# Patient Record
Sex: Female | Born: 1965 | Race: White | Hispanic: No | Marital: Single | Smoking: Current every day smoker
Health system: Southern US, Community
[De-identification: ages and names within clinical notes are randomized; demographics above are authoritative.]

## PROBLEM LIST (undated history)

## (undated) DIAGNOSIS — F319 Bipolar disorder, unspecified: Secondary | ICD-10-CM

## (undated) DIAGNOSIS — F4310 Post-traumatic stress disorder, unspecified: Secondary | ICD-10-CM

## (undated) DIAGNOSIS — F32A Depression, unspecified: Secondary | ICD-10-CM

## (undated) DIAGNOSIS — F259 Schizoaffective disorder, unspecified: Secondary | ICD-10-CM

## (undated) DIAGNOSIS — E7800 Pure hypercholesterolemia, unspecified: Secondary | ICD-10-CM

## (undated) DIAGNOSIS — F609 Personality disorder, unspecified: Secondary | ICD-10-CM

## (undated) DIAGNOSIS — F419 Anxiety disorder, unspecified: Secondary | ICD-10-CM

## (undated) DIAGNOSIS — E039 Hypothyroidism, unspecified: Secondary | ICD-10-CM

## (undated) DIAGNOSIS — F329 Major depressive disorder, single episode, unspecified: Secondary | ICD-10-CM

## (undated) DIAGNOSIS — F909 Attention-deficit hyperactivity disorder, unspecified type: Secondary | ICD-10-CM

## (undated) HISTORY — PX: CHOLECYSTECTOMY: SHX55

## (undated) HISTORY — PX: MANDIBLE FRACTURE SURGERY: SHX706

---

## 2004-09-24 ENCOUNTER — Emergency Department (HOSPITAL_COMMUNITY): Admission: EM | Admit: 2004-09-24 | Discharge: 2004-09-24 | Payer: Self-pay | Admitting: Emergency Medicine

## 2006-09-08 ENCOUNTER — Emergency Department (HOSPITAL_COMMUNITY): Admission: EM | Admit: 2006-09-08 | Discharge: 2006-09-08 | Payer: Self-pay | Admitting: Emergency Medicine

## 2006-09-19 ENCOUNTER — Emergency Department (HOSPITAL_COMMUNITY): Admission: EM | Admit: 2006-09-19 | Discharge: 2006-09-19 | Payer: Self-pay | Admitting: Emergency Medicine

## 2006-10-17 ENCOUNTER — Emergency Department (HOSPITAL_COMMUNITY): Admission: EM | Admit: 2006-10-17 | Discharge: 2006-10-17 | Payer: Self-pay | Admitting: Emergency Medicine

## 2006-11-01 ENCOUNTER — Emergency Department (HOSPITAL_COMMUNITY): Admission: EM | Admit: 2006-11-01 | Discharge: 2006-11-02 | Payer: Self-pay | Admitting: Emergency Medicine

## 2006-11-06 ENCOUNTER — Emergency Department (HOSPITAL_COMMUNITY): Admission: EM | Admit: 2006-11-06 | Discharge: 2006-11-07 | Payer: Self-pay | Admitting: Emergency Medicine

## 2006-11-13 ENCOUNTER — Emergency Department (HOSPITAL_COMMUNITY): Admission: EM | Admit: 2006-11-13 | Discharge: 2006-11-13 | Payer: Self-pay | Admitting: Emergency Medicine

## 2006-11-15 ENCOUNTER — Emergency Department (HOSPITAL_COMMUNITY): Admission: EM | Admit: 2006-11-15 | Discharge: 2006-11-15 | Payer: Self-pay | Admitting: Emergency Medicine

## 2006-11-16 ENCOUNTER — Emergency Department (HOSPITAL_COMMUNITY): Admission: EM | Admit: 2006-11-16 | Discharge: 2006-11-16 | Payer: Self-pay | Admitting: Emergency Medicine

## 2006-11-19 ENCOUNTER — Emergency Department (HOSPITAL_COMMUNITY): Admission: EM | Admit: 2006-11-19 | Discharge: 2006-11-19 | Payer: Self-pay | Admitting: Emergency Medicine

## 2006-11-22 ENCOUNTER — Other Ambulatory Visit: Payer: Self-pay | Admitting: Emergency Medicine

## 2006-11-22 ENCOUNTER — Other Ambulatory Visit: Payer: Self-pay

## 2006-11-22 ENCOUNTER — Inpatient Hospital Stay (HOSPITAL_COMMUNITY): Admission: RE | Admit: 2006-11-22 | Discharge: 2006-11-25 | Payer: Self-pay | Admitting: Psychiatry

## 2006-11-22 ENCOUNTER — Ambulatory Visit: Payer: Self-pay | Admitting: Psychiatry

## 2006-11-28 ENCOUNTER — Emergency Department (HOSPITAL_COMMUNITY): Admission: EM | Admit: 2006-11-28 | Discharge: 2006-11-28 | Payer: Self-pay | Admitting: Emergency Medicine

## 2006-11-29 ENCOUNTER — Inpatient Hospital Stay (HOSPITAL_COMMUNITY): Admission: AD | Admit: 2006-11-29 | Discharge: 2006-11-30 | Payer: Self-pay | Admitting: Psychiatry

## 2006-12-01 ENCOUNTER — Other Ambulatory Visit (HOSPITAL_COMMUNITY): Admission: RE | Admit: 2006-12-01 | Discharge: 2006-12-06 | Payer: Self-pay | Admitting: Psychiatry

## 2006-12-02 ENCOUNTER — Emergency Department (HOSPITAL_COMMUNITY): Admission: EM | Admit: 2006-12-02 | Discharge: 2006-12-02 | Payer: Self-pay | Admitting: Emergency Medicine

## 2006-12-07 ENCOUNTER — Inpatient Hospital Stay (HOSPITAL_COMMUNITY): Admission: AD | Admit: 2006-12-07 | Discharge: 2006-12-09 | Payer: Self-pay | Admitting: Psychiatry

## 2006-12-12 ENCOUNTER — Other Ambulatory Visit (HOSPITAL_COMMUNITY): Admission: RE | Admit: 2006-12-12 | Discharge: 2007-03-12 | Payer: Self-pay | Admitting: Psychiatry

## 2006-12-13 ENCOUNTER — Emergency Department (HOSPITAL_COMMUNITY): Admission: EM | Admit: 2006-12-13 | Discharge: 2006-12-13 | Payer: Self-pay | Admitting: Emergency Medicine

## 2006-12-20 ENCOUNTER — Emergency Department (HOSPITAL_COMMUNITY): Admission: EM | Admit: 2006-12-20 | Discharge: 2006-12-20 | Payer: Self-pay | Admitting: Emergency Medicine

## 2006-12-25 ENCOUNTER — Emergency Department (HOSPITAL_COMMUNITY): Admission: EM | Admit: 2006-12-25 | Discharge: 2006-12-25 | Payer: Self-pay | Admitting: Emergency Medicine

## 2007-01-01 ENCOUNTER — Emergency Department (HOSPITAL_COMMUNITY): Admission: EM | Admit: 2007-01-01 | Discharge: 2007-01-01 | Payer: Self-pay | Admitting: Emergency Medicine

## 2007-01-07 ENCOUNTER — Emergency Department (HOSPITAL_COMMUNITY): Admission: EM | Admit: 2007-01-07 | Discharge: 2007-01-07 | Payer: Self-pay | Admitting: Emergency Medicine

## 2007-01-13 ENCOUNTER — Emergency Department (HOSPITAL_COMMUNITY): Admission: EM | Admit: 2007-01-13 | Discharge: 2007-01-13 | Payer: Self-pay | Admitting: Emergency Medicine

## 2007-01-21 ENCOUNTER — Emergency Department (HOSPITAL_COMMUNITY): Admission: EM | Admit: 2007-01-21 | Discharge: 2007-01-21 | Payer: Self-pay | Admitting: Emergency Medicine

## 2007-01-29 ENCOUNTER — Emergency Department (HOSPITAL_COMMUNITY): Admission: EM | Admit: 2007-01-29 | Discharge: 2007-01-29 | Payer: Self-pay | Admitting: Emergency Medicine

## 2007-02-04 ENCOUNTER — Emergency Department (HOSPITAL_COMMUNITY): Admission: EM | Admit: 2007-02-04 | Discharge: 2007-02-04 | Payer: Self-pay | Admitting: Emergency Medicine

## 2007-02-18 ENCOUNTER — Emergency Department (HOSPITAL_COMMUNITY): Admission: EM | Admit: 2007-02-18 | Discharge: 2007-02-18 | Payer: Self-pay | Admitting: Emergency Medicine

## 2007-03-06 ENCOUNTER — Emergency Department (HOSPITAL_COMMUNITY): Admission: EM | Admit: 2007-03-06 | Discharge: 2007-03-06 | Payer: Self-pay | Admitting: Emergency Medicine

## 2007-04-06 ENCOUNTER — Emergency Department (HOSPITAL_COMMUNITY): Admission: EM | Admit: 2007-04-06 | Discharge: 2007-04-06 | Payer: Self-pay | Admitting: Emergency Medicine

## 2007-04-22 ENCOUNTER — Emergency Department (HOSPITAL_COMMUNITY): Admission: EM | Admit: 2007-04-22 | Discharge: 2007-04-22 | Payer: Self-pay | Admitting: Emergency Medicine

## 2007-04-30 ENCOUNTER — Emergency Department (HOSPITAL_COMMUNITY): Admission: EM | Admit: 2007-04-30 | Discharge: 2007-04-30 | Payer: Self-pay | Admitting: Emergency Medicine

## 2007-05-26 ENCOUNTER — Emergency Department (HOSPITAL_COMMUNITY): Admission: EM | Admit: 2007-05-26 | Discharge: 2007-05-26 | Payer: Self-pay | Admitting: Emergency Medicine

## 2007-06-11 ENCOUNTER — Emergency Department (HOSPITAL_COMMUNITY): Admission: EM | Admit: 2007-06-11 | Discharge: 2007-06-11 | Payer: Self-pay | Admitting: Emergency Medicine

## 2007-06-15 ENCOUNTER — Emergency Department (HOSPITAL_COMMUNITY): Admission: EM | Admit: 2007-06-15 | Discharge: 2007-06-15 | Payer: Self-pay | Admitting: Emergency Medicine

## 2007-06-21 ENCOUNTER — Other Ambulatory Visit: Payer: Self-pay | Admitting: Emergency Medicine

## 2007-06-21 ENCOUNTER — Ambulatory Visit: Payer: Self-pay | Admitting: *Deleted

## 2007-06-21 ENCOUNTER — Inpatient Hospital Stay (HOSPITAL_COMMUNITY): Admission: RE | Admit: 2007-06-21 | Discharge: 2007-06-26 | Payer: Self-pay | Admitting: *Deleted

## 2007-06-23 ENCOUNTER — Other Ambulatory Visit: Payer: Self-pay | Admitting: Emergency Medicine

## 2007-06-28 ENCOUNTER — Emergency Department (HOSPITAL_COMMUNITY): Admission: EM | Admit: 2007-06-28 | Discharge: 2007-06-28 | Payer: Self-pay | Admitting: Emergency Medicine

## 2007-06-30 ENCOUNTER — Other Ambulatory Visit (HOSPITAL_COMMUNITY): Admission: RE | Admit: 2007-06-30 | Discharge: 2007-07-05 | Payer: Self-pay | Admitting: Psychiatry

## 2007-07-04 ENCOUNTER — Ambulatory Visit: Payer: Self-pay | Admitting: Psychiatry

## 2007-07-05 ENCOUNTER — Inpatient Hospital Stay (HOSPITAL_COMMUNITY): Admission: AD | Admit: 2007-07-05 | Discharge: 2007-07-09 | Payer: Self-pay | Admitting: Psychiatry

## 2007-07-05 ENCOUNTER — Other Ambulatory Visit: Payer: Self-pay | Admitting: Emergency Medicine

## 2007-07-11 ENCOUNTER — Other Ambulatory Visit (HOSPITAL_COMMUNITY): Admission: RE | Admit: 2007-07-11 | Discharge: 2007-07-13 | Payer: Self-pay | Admitting: Psychiatry

## 2007-07-12 ENCOUNTER — Emergency Department (HOSPITAL_COMMUNITY): Admission: EM | Admit: 2007-07-12 | Discharge: 2007-07-12 | Payer: Self-pay | Admitting: Emergency Medicine

## 2007-07-13 ENCOUNTER — Emergency Department (HOSPITAL_COMMUNITY): Admission: EM | Admit: 2007-07-13 | Discharge: 2007-07-13 | Payer: Self-pay | Admitting: Emergency Medicine

## 2007-07-15 ENCOUNTER — Inpatient Hospital Stay (HOSPITAL_COMMUNITY): Admission: AD | Admit: 2007-07-15 | Discharge: 2007-07-16 | Payer: Self-pay | Admitting: Psychiatry

## 2007-07-20 ENCOUNTER — Inpatient Hospital Stay (HOSPITAL_COMMUNITY): Admission: AD | Admit: 2007-07-20 | Discharge: 2007-07-25 | Payer: Self-pay | Admitting: Psychiatry

## 2007-07-26 ENCOUNTER — Other Ambulatory Visit (HOSPITAL_COMMUNITY): Admission: RE | Admit: 2007-07-26 | Discharge: 2007-07-26 | Payer: Self-pay | Admitting: Psychiatry

## 2007-07-27 ENCOUNTER — Emergency Department (HOSPITAL_COMMUNITY): Admission: EM | Admit: 2007-07-27 | Discharge: 2007-07-27 | Payer: Self-pay | Admitting: Emergency Medicine

## 2007-07-27 ENCOUNTER — Ambulatory Visit: Payer: Self-pay | Admitting: Psychiatry

## 2007-07-29 ENCOUNTER — Inpatient Hospital Stay (HOSPITAL_COMMUNITY): Admission: EM | Admit: 2007-07-29 | Discharge: 2007-07-31 | Payer: Self-pay | Admitting: Psychiatry

## 2007-08-04 ENCOUNTER — Emergency Department (HOSPITAL_COMMUNITY): Admission: EM | Admit: 2007-08-04 | Discharge: 2007-08-05 | Payer: Self-pay | Admitting: Emergency Medicine

## 2007-08-06 ENCOUNTER — Emergency Department (HOSPITAL_COMMUNITY): Admission: EM | Admit: 2007-08-06 | Discharge: 2007-08-06 | Payer: Self-pay | Admitting: Emergency Medicine

## 2007-08-08 ENCOUNTER — Emergency Department (HOSPITAL_COMMUNITY): Admission: EM | Admit: 2007-08-08 | Discharge: 2007-08-09 | Payer: Self-pay | Admitting: Emergency Medicine

## 2007-08-11 ENCOUNTER — Emergency Department (HOSPITAL_COMMUNITY): Admission: EM | Admit: 2007-08-11 | Discharge: 2007-08-12 | Payer: Self-pay | Admitting: Emergency Medicine

## 2007-08-16 ENCOUNTER — Emergency Department (HOSPITAL_COMMUNITY): Admission: EM | Admit: 2007-08-16 | Discharge: 2007-08-17 | Payer: Self-pay | Admitting: Emergency Medicine

## 2007-08-18 ENCOUNTER — Emergency Department (HOSPITAL_COMMUNITY): Admission: EM | Admit: 2007-08-18 | Discharge: 2007-08-18 | Payer: Self-pay | Admitting: Emergency Medicine

## 2007-08-22 ENCOUNTER — Emergency Department (HOSPITAL_COMMUNITY): Admission: EM | Admit: 2007-08-22 | Discharge: 2007-08-23 | Payer: Self-pay | Admitting: Emergency Medicine

## 2007-08-26 ENCOUNTER — Emergency Department (HOSPITAL_COMMUNITY): Admission: EM | Admit: 2007-08-26 | Discharge: 2007-08-26 | Payer: Self-pay | Admitting: Emergency Medicine

## 2007-09-14 ENCOUNTER — Emergency Department (HOSPITAL_COMMUNITY): Admission: EM | Admit: 2007-09-14 | Discharge: 2007-09-14 | Payer: Self-pay | Admitting: Emergency Medicine

## 2007-09-25 ENCOUNTER — Emergency Department (HOSPITAL_COMMUNITY): Admission: EM | Admit: 2007-09-25 | Discharge: 2007-09-26 | Payer: Self-pay | Admitting: *Deleted

## 2007-10-07 ENCOUNTER — Emergency Department (HOSPITAL_COMMUNITY): Admission: EM | Admit: 2007-10-07 | Discharge: 2007-10-07 | Payer: Self-pay | Admitting: Emergency Medicine

## 2007-10-17 ENCOUNTER — Emergency Department (HOSPITAL_COMMUNITY): Admission: EM | Admit: 2007-10-17 | Discharge: 2007-10-17 | Payer: Self-pay | Admitting: Emergency Medicine

## 2007-10-21 ENCOUNTER — Emergency Department (HOSPITAL_COMMUNITY): Admission: EM | Admit: 2007-10-21 | Discharge: 2007-10-21 | Payer: Self-pay | Admitting: Emergency Medicine

## 2007-10-22 ENCOUNTER — Emergency Department (HOSPITAL_COMMUNITY): Admission: EM | Admit: 2007-10-22 | Discharge: 2007-10-22 | Payer: Self-pay | Admitting: Emergency Medicine

## 2007-10-25 ENCOUNTER — Other Ambulatory Visit: Payer: Self-pay

## 2007-10-25 ENCOUNTER — Ambulatory Visit: Payer: Self-pay | Admitting: Psychiatry

## 2007-10-26 ENCOUNTER — Inpatient Hospital Stay (HOSPITAL_COMMUNITY): Admission: RE | Admit: 2007-10-26 | Discharge: 2007-10-30 | Payer: Self-pay | Admitting: Psychiatry

## 2007-11-09 ENCOUNTER — Emergency Department (HOSPITAL_COMMUNITY): Admission: EM | Admit: 2007-11-09 | Discharge: 2007-11-09 | Payer: Self-pay | Admitting: Emergency Medicine

## 2007-12-11 ENCOUNTER — Emergency Department (HOSPITAL_COMMUNITY): Admission: EM | Admit: 2007-12-11 | Discharge: 2007-12-11 | Payer: Self-pay | Admitting: Emergency Medicine

## 2007-12-17 ENCOUNTER — Emergency Department (HOSPITAL_COMMUNITY): Admission: EM | Admit: 2007-12-17 | Discharge: 2007-12-17 | Payer: Self-pay | Admitting: Emergency Medicine

## 2008-01-03 ENCOUNTER — Emergency Department (HOSPITAL_COMMUNITY): Admission: EM | Admit: 2008-01-03 | Discharge: 2008-01-03 | Payer: Self-pay | Admitting: Emergency Medicine

## 2008-01-14 ENCOUNTER — Emergency Department (HOSPITAL_COMMUNITY): Admission: EM | Admit: 2008-01-14 | Discharge: 2008-01-14 | Payer: Self-pay | Admitting: Emergency Medicine

## 2008-01-15 ENCOUNTER — Emergency Department (HOSPITAL_COMMUNITY): Admission: EM | Admit: 2008-01-15 | Discharge: 2008-01-15 | Payer: Self-pay | Admitting: Emergency Medicine

## 2008-01-16 ENCOUNTER — Emergency Department (HOSPITAL_COMMUNITY): Admission: EM | Admit: 2008-01-16 | Discharge: 2008-01-16 | Payer: Self-pay | Admitting: Emergency Medicine

## 2008-01-18 ENCOUNTER — Ambulatory Visit: Payer: Self-pay | Admitting: Psychiatry

## 2008-01-18 ENCOUNTER — Other Ambulatory Visit: Payer: Self-pay | Admitting: Emergency Medicine

## 2008-01-18 ENCOUNTER — Inpatient Hospital Stay (HOSPITAL_COMMUNITY): Admission: RE | Admit: 2008-01-18 | Discharge: 2008-01-30 | Payer: Self-pay | Admitting: Psychiatry

## 2008-03-31 ENCOUNTER — Emergency Department (HOSPITAL_COMMUNITY): Admission: EM | Admit: 2008-03-31 | Discharge: 2008-04-01 | Payer: Self-pay | Admitting: Emergency Medicine

## 2008-04-01 ENCOUNTER — Emergency Department (HOSPITAL_COMMUNITY): Admission: EM | Admit: 2008-04-01 | Discharge: 2008-04-01 | Payer: Self-pay | Admitting: Emergency Medicine

## 2008-04-10 ENCOUNTER — Emergency Department (HOSPITAL_COMMUNITY): Admission: EM | Admit: 2008-04-10 | Discharge: 2008-04-11 | Payer: Self-pay | Admitting: Emergency Medicine

## 2008-04-13 ENCOUNTER — Emergency Department (HOSPITAL_COMMUNITY): Admission: EM | Admit: 2008-04-13 | Discharge: 2008-04-13 | Payer: Self-pay | Admitting: Emergency Medicine

## 2008-04-20 ENCOUNTER — Emergency Department (HOSPITAL_COMMUNITY): Admission: EM | Admit: 2008-04-20 | Discharge: 2008-04-20 | Payer: Self-pay | Admitting: Emergency Medicine

## 2008-04-21 ENCOUNTER — Emergency Department (HOSPITAL_COMMUNITY): Admission: EM | Admit: 2008-04-21 | Discharge: 2008-04-21 | Payer: Self-pay | Admitting: Emergency Medicine

## 2008-04-21 ENCOUNTER — Emergency Department (HOSPITAL_COMMUNITY): Admission: EM | Admit: 2008-04-21 | Discharge: 2008-04-22 | Payer: Self-pay | Admitting: Emergency Medicine

## 2008-05-01 ENCOUNTER — Emergency Department (HOSPITAL_COMMUNITY): Admission: EM | Admit: 2008-05-01 | Discharge: 2008-05-01 | Payer: Self-pay | Admitting: Emergency Medicine

## 2008-05-09 ENCOUNTER — Emergency Department (HOSPITAL_COMMUNITY): Admission: EM | Admit: 2008-05-09 | Discharge: 2008-05-09 | Payer: Self-pay | Admitting: Emergency Medicine

## 2008-05-10 ENCOUNTER — Emergency Department (HOSPITAL_COMMUNITY): Admission: EM | Admit: 2008-05-10 | Discharge: 2008-05-10 | Payer: Self-pay | Admitting: Emergency Medicine

## 2008-05-15 ENCOUNTER — Emergency Department (HOSPITAL_COMMUNITY): Admission: EM | Admit: 2008-05-15 | Discharge: 2008-05-15 | Payer: Self-pay | Admitting: Emergency Medicine

## 2008-05-17 ENCOUNTER — Emergency Department (HOSPITAL_COMMUNITY): Admission: EM | Admit: 2008-05-17 | Discharge: 2008-05-17 | Payer: Self-pay | Admitting: Emergency Medicine

## 2008-05-19 ENCOUNTER — Emergency Department (HOSPITAL_COMMUNITY): Admission: EM | Admit: 2008-05-19 | Discharge: 2008-05-19 | Payer: Self-pay | Admitting: Emergency Medicine

## 2008-05-20 ENCOUNTER — Emergency Department (HOSPITAL_COMMUNITY): Admission: EM | Admit: 2008-05-20 | Discharge: 2008-05-20 | Payer: Self-pay | Admitting: Emergency Medicine

## 2008-05-24 ENCOUNTER — Emergency Department (HOSPITAL_COMMUNITY): Admission: EM | Admit: 2008-05-24 | Discharge: 2008-05-24 | Payer: Self-pay | Admitting: Emergency Medicine

## 2008-06-01 ENCOUNTER — Emergency Department (HOSPITAL_COMMUNITY): Admission: EM | Admit: 2008-06-01 | Discharge: 2008-06-01 | Payer: Self-pay | Admitting: Emergency Medicine

## 2008-06-07 ENCOUNTER — Emergency Department (HOSPITAL_COMMUNITY): Admission: EM | Admit: 2008-06-07 | Discharge: 2008-06-07 | Payer: Self-pay | Admitting: Emergency Medicine

## 2008-06-14 ENCOUNTER — Emergency Department (HOSPITAL_COMMUNITY): Admission: EM | Admit: 2008-06-14 | Discharge: 2008-06-14 | Payer: Self-pay | Admitting: Emergency Medicine

## 2008-06-16 ENCOUNTER — Emergency Department (HOSPITAL_COMMUNITY): Admission: EM | Admit: 2008-06-16 | Discharge: 2008-06-17 | Payer: Self-pay | Admitting: Emergency Medicine

## 2008-06-27 ENCOUNTER — Emergency Department (HOSPITAL_COMMUNITY): Admission: EM | Admit: 2008-06-27 | Discharge: 2008-06-27 | Payer: Self-pay | Admitting: Emergency Medicine

## 2008-07-04 ENCOUNTER — Emergency Department (HOSPITAL_COMMUNITY): Admission: EM | Admit: 2008-07-04 | Discharge: 2008-07-04 | Payer: Self-pay | Admitting: Emergency Medicine

## 2008-07-06 ENCOUNTER — Emergency Department (HOSPITAL_COMMUNITY): Admission: EM | Admit: 2008-07-06 | Discharge: 2008-07-06 | Payer: Self-pay | Admitting: Emergency Medicine

## 2008-07-08 ENCOUNTER — Emergency Department (HOSPITAL_COMMUNITY): Admission: EM | Admit: 2008-07-08 | Discharge: 2008-07-08 | Payer: Self-pay | Admitting: Emergency Medicine

## 2008-07-12 ENCOUNTER — Emergency Department (HOSPITAL_COMMUNITY): Admission: EM | Admit: 2008-07-12 | Discharge: 2008-07-12 | Payer: Self-pay | Admitting: Emergency Medicine

## 2008-07-14 ENCOUNTER — Emergency Department (HOSPITAL_COMMUNITY): Admission: EM | Admit: 2008-07-14 | Discharge: 2008-07-14 | Payer: Self-pay | Admitting: Emergency Medicine

## 2008-07-16 ENCOUNTER — Emergency Department (HOSPITAL_COMMUNITY): Admission: EM | Admit: 2008-07-16 | Discharge: 2008-07-16 | Payer: Self-pay | Admitting: Emergency Medicine

## 2008-07-21 ENCOUNTER — Emergency Department (HOSPITAL_COMMUNITY): Admission: EM | Admit: 2008-07-21 | Discharge: 2008-07-22 | Payer: Self-pay | Admitting: Emergency Medicine

## 2008-07-22 ENCOUNTER — Emergency Department (HOSPITAL_COMMUNITY): Admission: EM | Admit: 2008-07-22 | Discharge: 2008-07-22 | Payer: Self-pay | Admitting: Emergency Medicine

## 2008-07-25 ENCOUNTER — Emergency Department (HOSPITAL_COMMUNITY): Admission: EM | Admit: 2008-07-25 | Discharge: 2008-07-25 | Payer: Self-pay | Admitting: Emergency Medicine

## 2008-08-01 ENCOUNTER — Emergency Department (HOSPITAL_COMMUNITY): Admission: EM | Admit: 2008-08-01 | Discharge: 2008-08-01 | Payer: Self-pay | Admitting: Emergency Medicine

## 2008-08-03 ENCOUNTER — Emergency Department (HOSPITAL_COMMUNITY): Admission: EM | Admit: 2008-08-03 | Discharge: 2008-08-03 | Payer: Self-pay | Admitting: Emergency Medicine

## 2008-08-07 ENCOUNTER — Other Ambulatory Visit: Payer: Self-pay | Admitting: Emergency Medicine

## 2008-08-08 ENCOUNTER — Other Ambulatory Visit: Payer: Self-pay

## 2008-08-08 ENCOUNTER — Ambulatory Visit: Payer: Self-pay | Admitting: *Deleted

## 2008-08-08 ENCOUNTER — Other Ambulatory Visit: Payer: Self-pay | Admitting: Emergency Medicine

## 2008-08-08 ENCOUNTER — Inpatient Hospital Stay (HOSPITAL_COMMUNITY): Admission: RE | Admit: 2008-08-08 | Discharge: 2008-08-19 | Payer: Self-pay | Admitting: *Deleted

## 2008-08-23 ENCOUNTER — Emergency Department (HOSPITAL_COMMUNITY): Admission: EM | Admit: 2008-08-23 | Discharge: 2008-08-23 | Payer: Self-pay | Admitting: Emergency Medicine

## 2008-08-24 ENCOUNTER — Emergency Department (HOSPITAL_COMMUNITY): Admission: EM | Admit: 2008-08-24 | Discharge: 2008-08-27 | Payer: Self-pay | Admitting: Emergency Medicine

## 2008-08-27 ENCOUNTER — Emergency Department (HOSPITAL_COMMUNITY): Admission: EM | Admit: 2008-08-27 | Discharge: 2008-08-27 | Payer: Self-pay | Admitting: Emergency Medicine

## 2008-08-29 ENCOUNTER — Emergency Department (HOSPITAL_COMMUNITY): Admission: EM | Admit: 2008-08-29 | Discharge: 2008-08-30 | Payer: Self-pay | Admitting: Emergency Medicine

## 2008-09-15 ENCOUNTER — Emergency Department (HOSPITAL_COMMUNITY): Admission: EM | Admit: 2008-09-15 | Discharge: 2008-09-15 | Payer: Self-pay | Admitting: Emergency Medicine

## 2008-09-17 ENCOUNTER — Emergency Department (HOSPITAL_COMMUNITY): Admission: EM | Admit: 2008-09-17 | Discharge: 2008-09-17 | Payer: Self-pay | Admitting: Emergency Medicine

## 2008-09-21 ENCOUNTER — Emergency Department (HOSPITAL_COMMUNITY): Admission: EM | Admit: 2008-09-21 | Discharge: 2008-09-21 | Payer: Self-pay | Admitting: Emergency Medicine

## 2008-09-22 ENCOUNTER — Emergency Department (HOSPITAL_COMMUNITY): Admission: EM | Admit: 2008-09-22 | Discharge: 2008-09-22 | Payer: Self-pay | Admitting: Emergency Medicine

## 2008-09-24 ENCOUNTER — Emergency Department (HOSPITAL_COMMUNITY): Admission: EM | Admit: 2008-09-24 | Discharge: 2008-09-25 | Payer: Self-pay | Admitting: Emergency Medicine

## 2008-09-25 ENCOUNTER — Emergency Department (HOSPITAL_COMMUNITY): Admission: EM | Admit: 2008-09-25 | Discharge: 2008-09-26 | Payer: Self-pay | Admitting: Emergency Medicine

## 2008-10-04 ENCOUNTER — Emergency Department (HOSPITAL_COMMUNITY): Admission: EM | Admit: 2008-10-04 | Discharge: 2008-10-05 | Payer: Self-pay | Admitting: Emergency Medicine

## 2008-10-06 ENCOUNTER — Emergency Department (HOSPITAL_COMMUNITY): Admission: EM | Admit: 2008-10-06 | Discharge: 2008-10-06 | Payer: Self-pay | Admitting: Emergency Medicine

## 2008-10-08 ENCOUNTER — Emergency Department (HOSPITAL_COMMUNITY): Admission: EM | Admit: 2008-10-08 | Discharge: 2008-10-09 | Payer: Self-pay | Admitting: Emergency Medicine

## 2008-10-23 ENCOUNTER — Emergency Department (HOSPITAL_COMMUNITY): Admission: EM | Admit: 2008-10-23 | Discharge: 2008-10-23 | Payer: Self-pay | Admitting: Emergency Medicine

## 2008-10-26 ENCOUNTER — Emergency Department (HOSPITAL_COMMUNITY): Admission: EM | Admit: 2008-10-26 | Discharge: 2008-10-26 | Payer: Self-pay | Admitting: Emergency Medicine

## 2008-10-27 ENCOUNTER — Emergency Department (HOSPITAL_COMMUNITY): Admission: EM | Admit: 2008-10-27 | Discharge: 2008-10-27 | Payer: Self-pay | Admitting: Emergency Medicine

## 2008-10-31 ENCOUNTER — Emergency Department (HOSPITAL_COMMUNITY): Admission: EM | Admit: 2008-10-31 | Discharge: 2008-10-31 | Payer: Self-pay | Admitting: Emergency Medicine

## 2008-11-03 ENCOUNTER — Emergency Department (HOSPITAL_COMMUNITY): Admission: EM | Admit: 2008-11-03 | Discharge: 2008-11-03 | Payer: Self-pay | Admitting: Emergency Medicine

## 2008-11-13 ENCOUNTER — Emergency Department (HOSPITAL_COMMUNITY): Admission: EM | Admit: 2008-11-13 | Discharge: 2008-11-13 | Payer: Self-pay | Admitting: Emergency Medicine

## 2008-11-18 ENCOUNTER — Emergency Department (HOSPITAL_COMMUNITY): Admission: EM | Admit: 2008-11-18 | Discharge: 2008-11-18 | Payer: Self-pay | Admitting: Emergency Medicine

## 2008-11-21 ENCOUNTER — Emergency Department (HOSPITAL_COMMUNITY): Admission: EM | Admit: 2008-11-21 | Discharge: 2008-11-21 | Payer: Self-pay | Admitting: Emergency Medicine

## 2008-11-28 ENCOUNTER — Emergency Department (HOSPITAL_COMMUNITY): Admission: EM | Admit: 2008-11-28 | Discharge: 2008-11-28 | Payer: Self-pay | Admitting: Emergency Medicine

## 2008-11-30 ENCOUNTER — Emergency Department (HOSPITAL_COMMUNITY): Admission: EM | Admit: 2008-11-30 | Discharge: 2008-11-30 | Payer: Self-pay | Admitting: Emergency Medicine

## 2008-12-07 ENCOUNTER — Emergency Department (HOSPITAL_COMMUNITY): Admission: EM | Admit: 2008-12-07 | Discharge: 2008-12-07 | Payer: Self-pay | Admitting: Emergency Medicine

## 2008-12-10 ENCOUNTER — Emergency Department (HOSPITAL_COMMUNITY): Admission: EM | Admit: 2008-12-10 | Discharge: 2008-12-11 | Payer: Self-pay | Admitting: Emergency Medicine

## 2008-12-12 ENCOUNTER — Emergency Department (HOSPITAL_COMMUNITY): Admission: EM | Admit: 2008-12-12 | Discharge: 2008-12-12 | Payer: Self-pay | Admitting: Emergency Medicine

## 2009-01-27 ENCOUNTER — Emergency Department (HOSPITAL_COMMUNITY): Admission: EM | Admit: 2009-01-27 | Discharge: 2009-01-27 | Payer: Self-pay | Admitting: Emergency Medicine

## 2009-02-01 ENCOUNTER — Emergency Department (HOSPITAL_COMMUNITY): Admission: EM | Admit: 2009-02-01 | Discharge: 2009-02-01 | Payer: Self-pay | Admitting: Emergency Medicine

## 2009-03-16 ENCOUNTER — Emergency Department (HOSPITAL_COMMUNITY): Admission: EM | Admit: 2009-03-16 | Discharge: 2009-03-16 | Payer: Self-pay | Admitting: Emergency Medicine

## 2009-06-01 ENCOUNTER — Emergency Department (HOSPITAL_COMMUNITY): Admission: EM | Admit: 2009-06-01 | Discharge: 2009-06-01 | Payer: Self-pay | Admitting: Emergency Medicine

## 2009-06-28 ENCOUNTER — Emergency Department (HOSPITAL_COMMUNITY): Admission: EM | Admit: 2009-06-28 | Discharge: 2009-06-28 | Payer: Self-pay | Admitting: Emergency Medicine

## 2009-06-29 ENCOUNTER — Emergency Department (HOSPITAL_COMMUNITY): Admission: EM | Admit: 2009-06-29 | Discharge: 2009-06-29 | Payer: Self-pay | Admitting: Emergency Medicine

## 2009-07-22 IMAGING — CR DG CHEST 2V
2 series · 2 of 2 positions shown · non-contrast
Comparison: 12/13/2006

CLINICAL DATA: Dizziness short of breath chest pain

CHEST - 2 VIEW

[w chest pa]
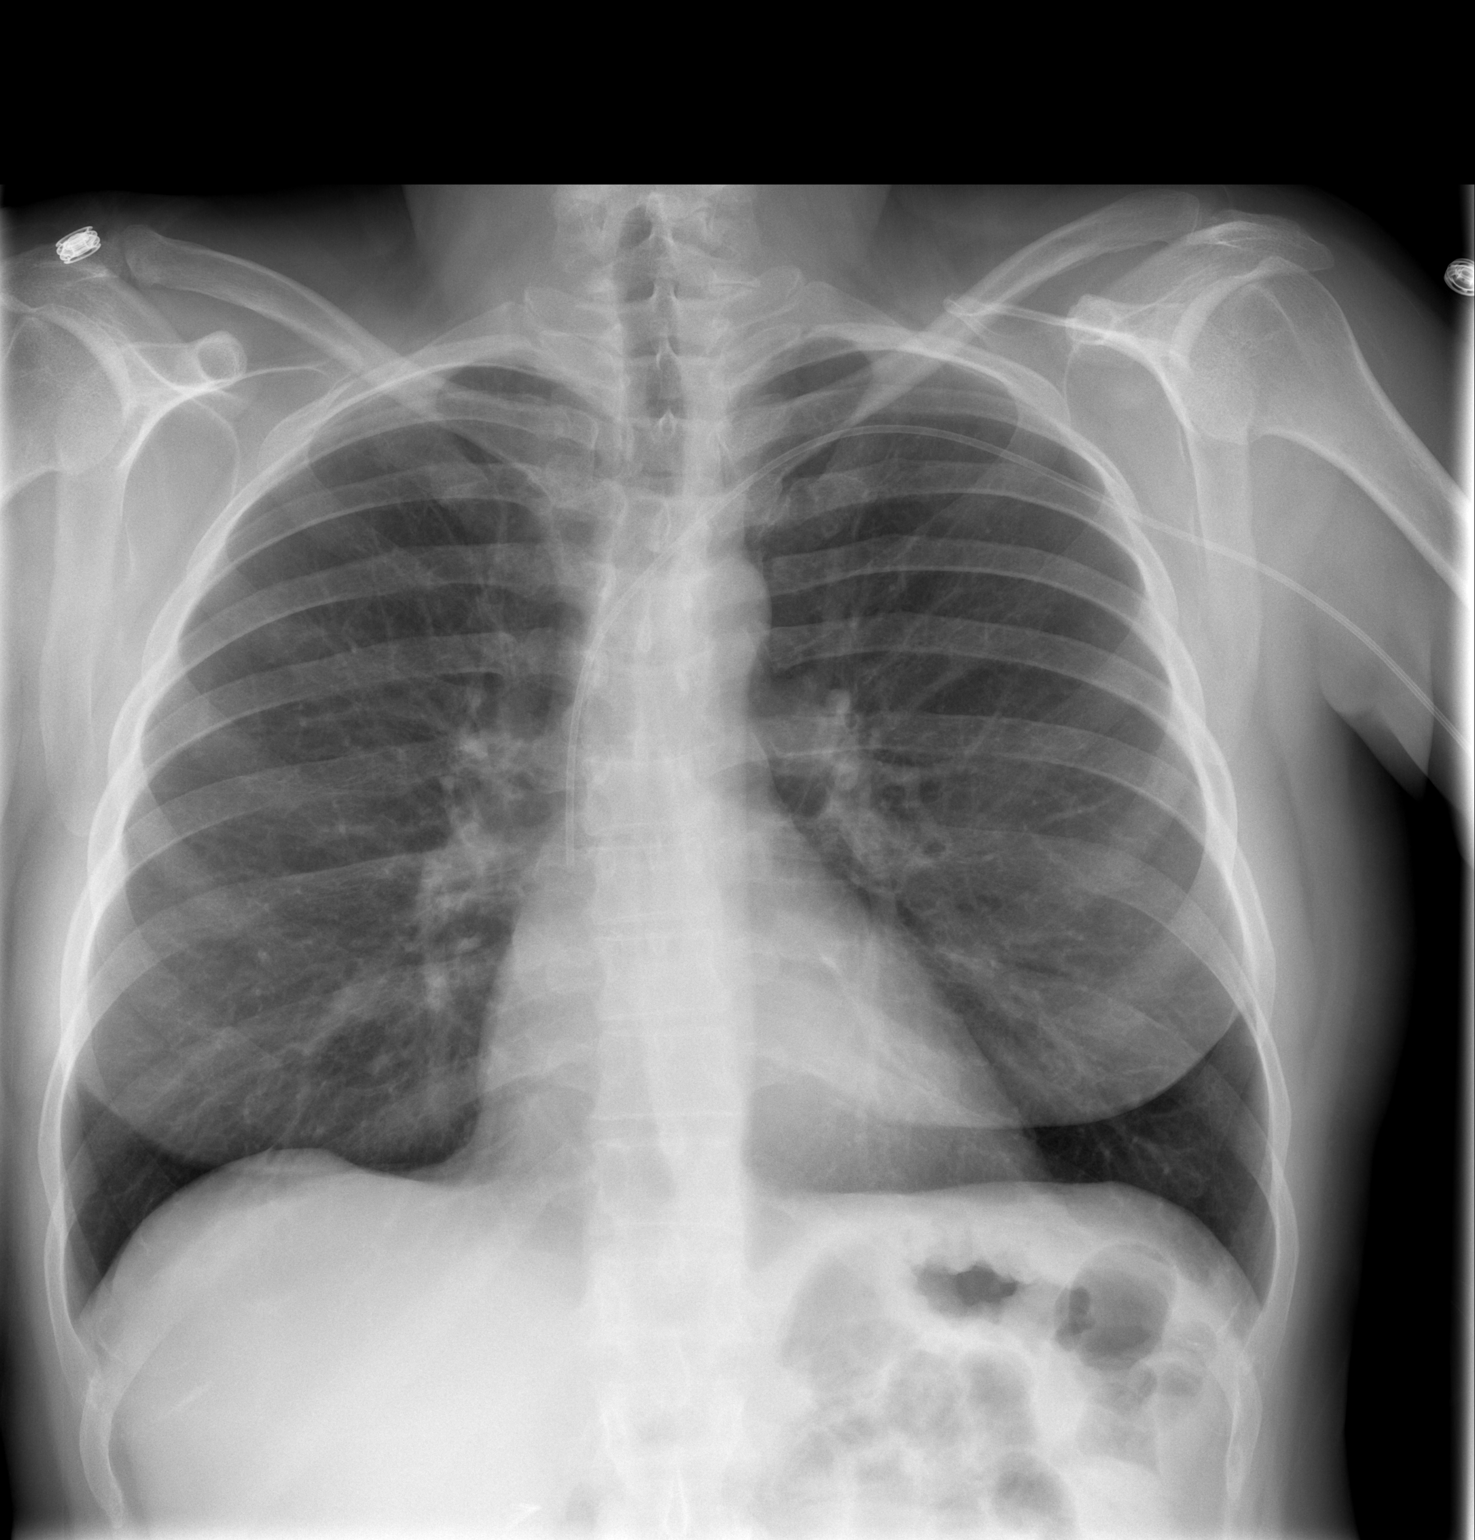

[w chest lat]
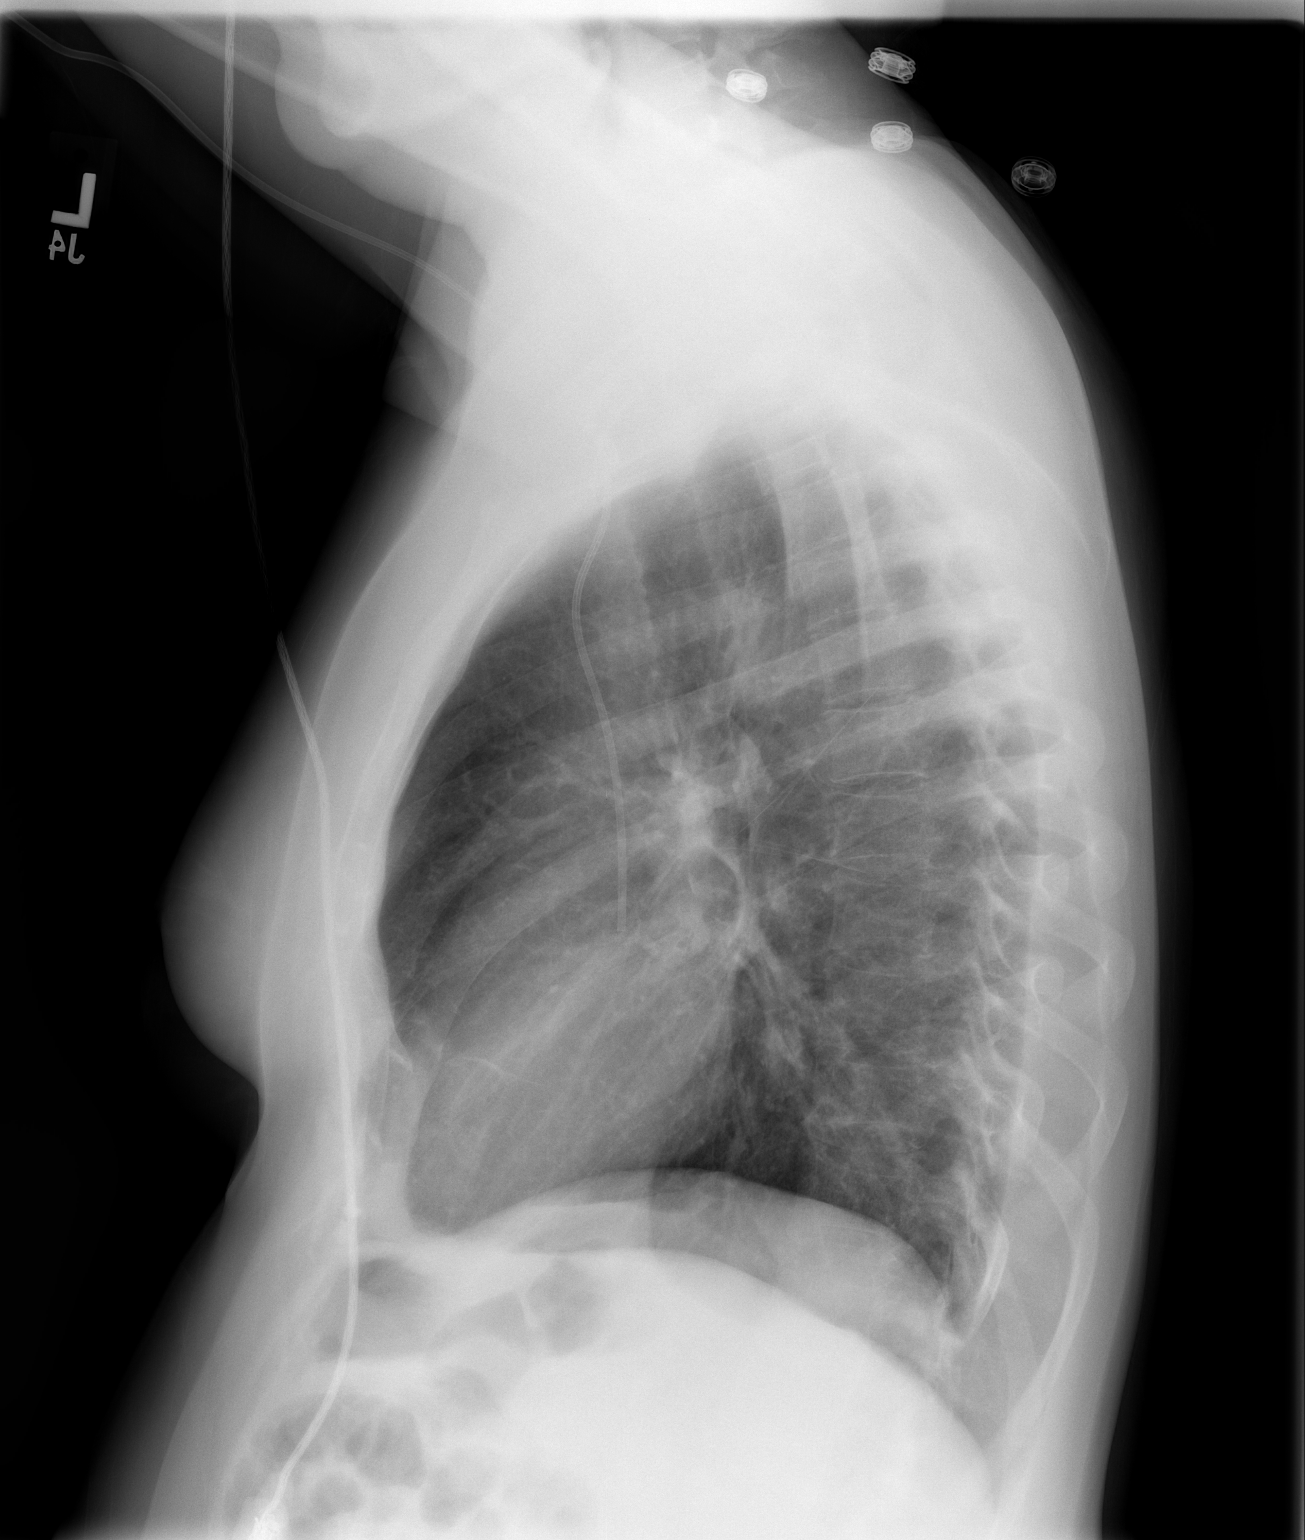

[2 of 2 positions shown; findings below may reference images not displayed]

FINDINGS: Left arm PICC line tip is in the SVC.  There are
increased interstitial markings relative to the prior study.  This
could be due to fluid overload or an acute infectious process such
as a viral pneumonia.  There is no pleural effusion.  There is no
lumbar infiltrate.
IMPRESSION: Increased interstitial markings may be due to fluid overload or an
infectious process such as viral pneumonia.

## 2009-09-26 ENCOUNTER — Emergency Department (HOSPITAL_COMMUNITY): Admission: EM | Admit: 2009-09-26 | Discharge: 2009-09-26 | Payer: Self-pay | Admitting: Emergency Medicine

## 2009-09-27 ENCOUNTER — Emergency Department (HOSPITAL_COMMUNITY): Admission: EM | Admit: 2009-09-27 | Discharge: 2009-09-27 | Payer: Self-pay | Admitting: Emergency Medicine

## 2009-10-04 ENCOUNTER — Emergency Department (HOSPITAL_COMMUNITY): Admission: EM | Admit: 2009-10-04 | Discharge: 2009-10-04 | Payer: Self-pay | Admitting: Emergency Medicine

## 2009-11-08 ENCOUNTER — Emergency Department (HOSPITAL_COMMUNITY): Admission: EM | Admit: 2009-11-08 | Discharge: 2009-11-08 | Payer: Self-pay | Admitting: Emergency Medicine

## 2010-01-07 ENCOUNTER — Emergency Department (HOSPITAL_COMMUNITY)
Admission: EM | Admit: 2010-01-07 | Discharge: 2010-01-07 | Payer: Self-pay | Source: Home / Self Care | Admitting: Emergency Medicine

## 2010-02-21 ENCOUNTER — Emergency Department (HOSPITAL_COMMUNITY)
Admission: EM | Admit: 2010-02-21 | Discharge: 2010-02-21 | Disposition: A | Discharge status: Home / Self Care | Payer: Medicare Other | Attending: Emergency Medicine | Admitting: Emergency Medicine

## 2010-02-21 DIAGNOSIS — G43909 Migraine, unspecified, not intractable, without status migrainosus: Secondary | ICD-10-CM | POA: Insufficient documentation

## 2010-02-21 DIAGNOSIS — F319 Bipolar disorder, unspecified: Secondary | ICD-10-CM | POA: Insufficient documentation

## 2010-02-21 DIAGNOSIS — R110 Nausea: Secondary | ICD-10-CM | POA: Insufficient documentation

## 2010-02-21 DIAGNOSIS — F411 Generalized anxiety disorder: Secondary | ICD-10-CM | POA: Insufficient documentation

## 2010-02-21 DIAGNOSIS — E039 Hypothyroidism, unspecified: Secondary | ICD-10-CM | POA: Insufficient documentation

## 2010-02-21 DIAGNOSIS — E785 Hyperlipidemia, unspecified: Secondary | ICD-10-CM | POA: Insufficient documentation

## 2010-03-01 ENCOUNTER — Emergency Department (HOSPITAL_COMMUNITY)
Admission: EM | Admit: 2010-03-01 | Discharge: 2010-03-01 | Disposition: A | Discharge status: Home / Self Care | Payer: Medicare Other | Attending: Emergency Medicine | Admitting: Emergency Medicine

## 2010-03-01 DIAGNOSIS — R45850 Homicidal ideations: Secondary | ICD-10-CM | POA: Insufficient documentation

## 2010-03-01 DIAGNOSIS — R45851 Suicidal ideations: Secondary | ICD-10-CM | POA: Insufficient documentation

## 2010-03-01 DIAGNOSIS — E039 Hypothyroidism, unspecified: Secondary | ICD-10-CM | POA: Insufficient documentation

## 2010-03-01 DIAGNOSIS — Z008 Encounter for other general examination: Secondary | ICD-10-CM | POA: Insufficient documentation

## 2010-03-01 DIAGNOSIS — IMO0002 Reserved for concepts with insufficient information to code with codable children: Secondary | ICD-10-CM | POA: Insufficient documentation

## 2010-03-01 DIAGNOSIS — G43909 Migraine, unspecified, not intractable, without status migrainosus: Secondary | ICD-10-CM | POA: Insufficient documentation

## 2010-03-01 DIAGNOSIS — Z79899 Other long term (current) drug therapy: Secondary | ICD-10-CM | POA: Insufficient documentation

## 2010-03-01 DIAGNOSIS — Z9889 Other specified postprocedural states: Secondary | ICD-10-CM | POA: Insufficient documentation

## 2010-03-01 DIAGNOSIS — F341 Dysthymic disorder: Secondary | ICD-10-CM | POA: Insufficient documentation

## 2010-03-01 DIAGNOSIS — F319 Bipolar disorder, unspecified: Secondary | ICD-10-CM | POA: Insufficient documentation

## 2010-03-01 DIAGNOSIS — E785 Hyperlipidemia, unspecified: Secondary | ICD-10-CM | POA: Insufficient documentation

## 2010-03-01 LAB — DIFFERENTIAL
Eosinophils Relative: 1 % (ref 0–5)
Lymphocytes Relative: 47 % — ABNORMAL HIGH (ref 12–46)
Lymphs Abs: 4.3 10*3/uL — ABNORMAL HIGH (ref 0.7–4.0)
Monocytes Absolute: 0.6 10*3/uL (ref 0.1–1.0)
Monocytes Relative: 7 % (ref 3–12)
Neutro Abs: 4.1 10*3/uL (ref 1.7–7.7)
Neutrophils Relative %: 44 % (ref 43–77)

## 2010-03-01 LAB — ETHANOL: Alcohol, Ethyl (B): <5 mg/dL (ref 0–10)

## 2010-03-01 LAB — CBC
HCT: 43.5 % (ref 36.0–46.0)
MCH: 32.1 pg (ref 26.0–34.0)
MCHC: 34.9 g/dL (ref 30.0–36.0)
MCV: 92 fL (ref 78.0–100.0)
RBC: 4.73 MIL/uL (ref 3.87–5.11)
WBC: 9.1 10*3/uL (ref 4.0–10.5)

## 2010-03-01 LAB — COMPREHENSIVE METABOLIC PANEL
AST: 25 U/L (ref 0–37)
Alkaline Phosphatase: 70 U/L (ref 39–117)
Calcium: 9.2 mg/dL (ref 8.4–10.5)
Glucose, Bld: 96 mg/dL (ref 70–99)
Potassium: 3.9 mEq/L (ref 3.5–5.1)
Total Bilirubin: 0.1 mg/dL — ABNORMAL LOW (ref 0.3–1.2)
Total Protein: 7.4 g/dL (ref 6.0–8.3)

## 2010-03-01 LAB — RAPID URINE DRUG SCREEN, HOSP PERFORMED
Amphetamines: NOT DETECTED
Barbiturates: NOT DETECTED
Benzodiazepines: POSITIVE — AB
Tetrahydrocannabinol: NOT DETECTED

## 2010-04-22 LAB — CBC
HCT: 34.9 % — ABNORMAL LOW (ref 36.0–46.0)
HCT: 35.5 % — ABNORMAL LOW (ref 36.0–46.0)
Hemoglobin: 11.9 g/dL — ABNORMAL LOW (ref 12.0–15.0)
Hemoglobin: 11.7 g/dL — ABNORMAL LOW (ref 12.0–15.0)
MCHC: 34.1 g/dL (ref 30.0–36.0)
MCV: 84.9 fL (ref 78.0–100.0)
MCV: 86.1 fL (ref 78.0–100.0)
Platelets: 241 10*3/uL (ref 150–400)
RBC: 4.12 MIL/uL (ref 3.87–5.11)
RDW: 16.8 % — ABNORMAL HIGH (ref 11.5–15.5)
WBC: 9.7 10*3/uL (ref 4.0–10.5)

## 2010-04-22 LAB — PREGNANCY, URINE: Preg Test, Ur: NEGATIVE

## 2010-04-22 LAB — URINALYSIS, ROUTINE W REFLEX MICROSCOPIC
Bilirubin Urine: NEGATIVE
Bilirubin Urine: NEGATIVE
Glucose, UA: NEGATIVE mg/dL
Ketones, ur: 15 mg/dL — AB
Ketones, ur: NEGATIVE mg/dL
Leukocytes, UA: NEGATIVE
Nitrite: NEGATIVE
Nitrite: NEGATIVE
Nitrite: NEGATIVE
Protein, ur: 100 mg/dL — AB
Specific Gravity, Urine: 1.007 (ref 1.005–1.030)
Specific Gravity, Urine: 1.009 (ref 1.005–1.030)
Specific Gravity, Urine: 1.007 (ref 1.005–1.030)
Urobilinogen, UA: 0.2 mg/dL (ref 0.0–1.0)
Urobilinogen, UA: 0.2 mg/dL (ref 0.0–1.0)
Urobilinogen, UA: 0.2 mg/dL (ref 0.0–1.0)
pH: 5.5 (ref 5.0–8.0)
pH: 6 (ref 5.0–8.0)

## 2010-04-22 LAB — URINE MICROSCOPIC-ADD ON

## 2010-04-22 LAB — RAPID URINE DRUG SCREEN, HOSP PERFORMED
Cocaine: NOT DETECTED
Opiates: NOT DETECTED
Tetrahydrocannabinol: NOT DETECTED
Tetrahydrocannabinol: NOT DETECTED

## 2010-04-22 LAB — DIFFERENTIAL
Basophils Absolute: 0.1 10*3/uL (ref 0.0–0.1)
Basophils Relative: 1 % (ref 0–1)
Eosinophils Absolute: 0.2 10*3/uL (ref 0.0–0.7)
Eosinophils Relative: 2 % (ref 0–5)
Eosinophils Relative: 2 % (ref 0–5)
Lymphocytes Relative: 39 % (ref 12–46)
Lymphocytes Relative: 30 % (ref 12–46)
Lymphs Abs: 2.9 10*3/uL (ref 0.7–4.0)
Monocytes Absolute: 0.6 10*3/uL (ref 0.1–1.0)
Monocytes Absolute: 0.8 10*3/uL (ref 0.1–1.0)
Monocytes Relative: 8 % (ref 3–12)

## 2010-04-22 LAB — POCT CARDIAC MARKERS
CKMB, poc: <1 ng/mL — ABNORMAL LOW (ref 1.0–8.0)
Troponin i, poc: <0.05 ng/mL (ref 0.00–0.09)
Troponin i, poc: <0.05 ng/mL (ref 0.00–0.09)

## 2010-04-22 LAB — ETHANOL: Alcohol, Ethyl (B): <5 mg/dL (ref 0–10)

## 2010-04-22 LAB — BASIC METABOLIC PANEL
CO2: 23 mEq/L (ref 19–32)
Chloride: 104 mEq/L (ref 96–112)
GFR calc non Af Amer: >60 mL/min (ref >60)
Glucose, Bld: 89 mg/dL (ref 70–99)
Potassium: 3.4 mEq/L — ABNORMAL LOW (ref 3.5–5.1)
Potassium: 3.7 mEq/L (ref 3.5–5.1)
Sodium: 137 mEq/L (ref 135–145)

## 2010-04-23 LAB — BASIC METABOLIC PANEL
BUN: 5 mg/dL — ABNORMAL LOW (ref 6–23)
CO2: 23 mEq/L (ref 19–32)
Chloride: 104 mEq/L (ref 96–112)
Glucose, Bld: 87 mg/dL (ref 70–99)
Potassium: 3.6 mEq/L (ref 3.5–5.1)

## 2010-04-23 LAB — RAPID URINE DRUG SCREEN, HOSP PERFORMED
Amphetamines: NOT DETECTED
Barbiturates: NOT DETECTED
Benzodiazepines: POSITIVE — AB
Tetrahydrocannabinol: NOT DETECTED

## 2010-04-23 LAB — ETHANOL: Alcohol, Ethyl (B): <5 mg/dL (ref 0–10)

## 2010-04-24 LAB — DIFFERENTIAL
Basophils Absolute: 0.3 10*3/uL — ABNORMAL HIGH (ref 0.0–0.1)
Basophils Relative: 4 % — ABNORMAL HIGH (ref 0–1)
Eosinophils Absolute: 0.2 10*3/uL (ref 0.0–0.7)
Eosinophils Absolute: 0.2 10*3/uL (ref 0.0–0.7)
Eosinophils Absolute: 0.3 10*3/uL (ref 0.0–0.7)
Eosinophils Absolute: 0.2 10*3/uL (ref 0.0–0.7)
Eosinophils Relative: 3 % (ref 0–5)
Lymphocytes Relative: 36 % (ref 12–46)
Lymphs Abs: 3.3 10*3/uL (ref 0.7–4.0)
Lymphs Abs: 3.3 10*3/uL (ref 0.7–4.0)
Lymphs Abs: 3.1 10*3/uL (ref 0.7–4.0)
Monocytes Relative: 11 % (ref 3–12)
Monocytes Relative: 15 % — ABNORMAL HIGH (ref 3–12)
Monocytes Relative: 6 % (ref 3–12)
Neutro Abs: 3.7 10*3/uL (ref 1.7–7.7)
Neutrophils Relative %: 40 % — ABNORMAL LOW (ref 43–77)
Neutrophils Relative %: 45 % (ref 43–77)
Neutrophils Relative %: 50 % (ref 43–77)
Neutrophils Relative %: 55 % (ref 43–77)

## 2010-04-24 LAB — BASIC METABOLIC PANEL
BUN: 5 mg/dL — ABNORMAL LOW (ref 6–23)
BUN: 7 mg/dL (ref 6–23)
Calcium: 8.5 mg/dL (ref 8.4–10.5)
Chloride: 110 mEq/L (ref 96–112)
Creatinine, Ser: 0.69 mg/dL (ref 0.4–1.2)
Creatinine, Ser: 0.73 mg/dL (ref 0.4–1.2)
GFR calc Af Amer: >60 mL/min (ref >60)
GFR calc Af Amer: >60 mL/min (ref >60)
GFR calc non Af Amer: >60 mL/min (ref >60)
Potassium: 4 mEq/L (ref 3.5–5.1)

## 2010-04-24 LAB — RAPID URINE DRUG SCREEN, HOSP PERFORMED
Amphetamines: NOT DETECTED
Amphetamines: NOT DETECTED
Barbiturates: NOT DETECTED
Barbiturates: NOT DETECTED
Benzodiazepines: POSITIVE — AB
Cocaine: NOT DETECTED
Cocaine: NOT DETECTED
Opiates: NOT DETECTED
Opiates: NOT DETECTED
Opiates: NOT DETECTED
Tetrahydrocannabinol: NOT DETECTED

## 2010-04-24 LAB — POCT I-STAT, CHEM 8
BUN: 4 mg/dL — ABNORMAL LOW (ref 6–23)
Chloride: 107 mEq/L (ref 96–112)
Chloride: 108 mEq/L (ref 96–112)
Creatinine, Ser: 0.6 mg/dL (ref 0.4–1.2)
Glucose, Bld: 102 mg/dL — ABNORMAL HIGH (ref 70–99)
Potassium: 3.7 mEq/L (ref 3.5–5.1)
Sodium: 140 mEq/L (ref 135–145)

## 2010-04-24 LAB — CBC
HCT: 32.6 % — ABNORMAL LOW (ref 36.0–46.0)
MCHC: 32.7 g/dL (ref 30.0–36.0)
MCV: 88.2 fL (ref 78.0–100.0)
MCV: 88.9 fL (ref 78.0–100.0)
MCV: 89.2 fL (ref 78.0–100.0)
MCV: 88.8 fL (ref 78.0–100.0)
Platelets: 190 10*3/uL (ref 150–400)
Platelets: 248 10*3/uL (ref 150–400)
RBC: 3.67 MIL/uL — ABNORMAL LOW (ref 3.87–5.11)
RBC: 3.84 MIL/uL — ABNORMAL LOW (ref 3.87–5.11)
RBC: 4.1 MIL/uL (ref 3.87–5.11)
RDW: 15.8 % — ABNORMAL HIGH (ref 11.5–15.5)
WBC: 7.5 10*3/uL (ref 4.0–10.5)
WBC: 8.2 10*3/uL (ref 4.0–10.5)
WBC: 8.9 10*3/uL (ref 4.0–10.5)
WBC: 8.6 10*3/uL (ref 4.0–10.5)

## 2010-04-24 LAB — POCT PREGNANCY, URINE: Preg Test, Ur: NEGATIVE

## 2010-04-24 LAB — URINALYSIS, ROUTINE W REFLEX MICROSCOPIC
Bilirubin Urine: NEGATIVE
Glucose, UA: NEGATIVE mg/dL
Glucose, UA: NEGATIVE mg/dL
Hgb urine dipstick: NEGATIVE
Hgb urine dipstick: NEGATIVE
Ketones, ur: NEGATIVE mg/dL
Nitrite: NEGATIVE
Specific Gravity, Urine: 1.006 (ref 1.005–1.030)
Specific Gravity, Urine: 1.017 (ref 1.005–1.030)
Specific Gravity, Urine: 1.005 (ref 1.005–1.030)
pH: 6 (ref 5.0–8.0)
pH: 7 (ref 5.0–8.0)
pH: 7.5 (ref 5.0–8.0)

## 2010-04-24 LAB — ETHANOL
Alcohol, Ethyl (B): <5 mg/dL (ref 0–10)
Alcohol, Ethyl (B): <5 mg/dL (ref 0–10)

## 2010-04-24 LAB — TSH: TSH: 0.408 u[IU]/mL (ref 0.350–4.500)

## 2010-04-25 LAB — DIFFERENTIAL
Basophils Absolute: 0.1 10*3/uL (ref 0.0–0.1)
Basophils Relative: 2 % — ABNORMAL HIGH (ref 0–1)
Eosinophils Absolute: 0.4 10*3/uL (ref 0.0–0.7)
Eosinophils Relative: 4 % (ref 0–5)
Lymphocytes Relative: 40 % (ref 12–46)
Lymphocytes Relative: 49 % — ABNORMAL HIGH (ref 12–46)
Lymphs Abs: 3.8 10*3/uL (ref 0.7–4.0)
Lymphs Abs: 5.1 10*3/uL — ABNORMAL HIGH (ref 0.7–4.0)
Monocytes Absolute: 0.6 10*3/uL (ref 0.1–1.0)
Monocytes Absolute: 0.9 10*3/uL (ref 0.1–1.0)
Monocytes Relative: 7 % (ref 3–12)
Monocytes Relative: 8 % (ref 3–12)
Monocytes Relative: 9 % (ref 3–12)
Neutro Abs: 5.1 10*3/uL (ref 1.7–7.7)
Neutro Abs: 5.2 10*3/uL (ref 1.7–7.7)
Neutrophils Relative %: 41 % — ABNORMAL LOW (ref 43–77)
Neutrophils Relative %: 41 % — ABNORMAL LOW (ref 43–77)
Neutrophils Relative %: 48 % (ref 43–77)

## 2010-04-25 LAB — CBC
Hemoglobin: 12.2 g/dL (ref 12.0–15.0)
MCHC: 34.3 g/dL (ref 30.0–36.0)
MCHC: 32.8 g/dL (ref 30.0–36.0)
Platelets: 236 10*3/uL (ref 150–400)
RBC: 3.75 MIL/uL — ABNORMAL LOW (ref 3.87–5.11)
RBC: 4.04 MIL/uL (ref 3.87–5.11)
RBC: 4.17 MIL/uL (ref 3.87–5.11)
RDW: 16.2 % — ABNORMAL HIGH (ref 11.5–15.5)
RDW: 17.1 % — ABNORMAL HIGH (ref 11.5–15.5)
WBC: 10.3 10*3/uL (ref 4.0–10.5)
WBC: 12.3 10*3/uL — ABNORMAL HIGH (ref 4.0–10.5)

## 2010-04-25 LAB — URINALYSIS, ROUTINE W REFLEX MICROSCOPIC
Bilirubin Urine: NEGATIVE
Bilirubin Urine: NEGATIVE
Glucose, UA: NEGATIVE mg/dL
Hgb urine dipstick: NEGATIVE
Ketones, ur: NEGATIVE mg/dL
Leukocytes, UA: NEGATIVE
Nitrite: NEGATIVE
Nitrite: NEGATIVE
Nitrite: NEGATIVE
Protein, ur: NEGATIVE mg/dL
Specific Gravity, Urine: 1.006 (ref 1.005–1.030)
Specific Gravity, Urine: 1.009 (ref 1.005–1.030)
Specific Gravity, Urine: 1.012 (ref 1.005–1.030)
Urobilinogen, UA: 1 mg/dL (ref 0.0–1.0)
Urobilinogen, UA: 0.2 mg/dL (ref 0.0–1.0)
pH: 7 (ref 5.0–8.0)
pH: 6 (ref 5.0–8.0)

## 2010-04-25 LAB — RAPID URINE DRUG SCREEN, HOSP PERFORMED
Amphetamines: NOT DETECTED
Barbiturates: NOT DETECTED
Benzodiazepines: POSITIVE — AB
Cocaine: NOT DETECTED
Opiates: NOT DETECTED
Tetrahydrocannabinol: NOT DETECTED
Tetrahydrocannabinol: NOT DETECTED

## 2010-04-25 LAB — COMPREHENSIVE METABOLIC PANEL
ALT: 19 U/L (ref 0–35)
AST: 27 U/L (ref 0–37)
Alkaline Phosphatase: 61 U/L (ref 39–117)
CO2: 21 mEq/L (ref 19–32)
Calcium: 8.4 mg/dL (ref 8.4–10.5)
GFR calc Af Amer: >60 mL/min (ref >60)
Potassium: 4 mEq/L (ref 3.5–5.1)
Sodium: 139 mEq/L (ref 135–145)
Total Protein: 6 g/dL (ref 6.0–8.3)

## 2010-04-25 LAB — URINE MICROSCOPIC-ADD ON

## 2010-04-25 LAB — BASIC METABOLIC PANEL
BUN: 19 mg/dL (ref 6–23)
CO2: 18 mEq/L — ABNORMAL LOW (ref 19–32)
Calcium: 8.1 mg/dL — ABNORMAL LOW (ref 8.4–10.5)
Calcium: 9.1 mg/dL (ref 8.4–10.5)
Creatinine, Ser: 0.75 mg/dL (ref 0.4–1.2)
Creatinine, Ser: 0.97 mg/dL (ref 0.4–1.2)
GFR calc Af Amer: >60 mL/min (ref >60)
GFR calc non Af Amer: >60 mL/min (ref >60)
GFR calc non Af Amer: >60 mL/min (ref >60)
Potassium: 3.7 mEq/L (ref 3.5–5.1)

## 2010-04-25 LAB — POCT I-STAT, CHEM 8
BUN: 5 mg/dL — ABNORMAL LOW (ref 6–23)
Calcium, Ion: 1.13 mmol/L (ref 1.12–1.32)
Chloride: 107 mEq/L (ref 96–112)
Creatinine, Ser: 0.7 mg/dL (ref 0.4–1.2)
Glucose, Bld: 80 mg/dL (ref 70–99)
TCO2: 20 mmol/L (ref 0–100)

## 2010-04-25 LAB — ACETAMINOPHEN LEVEL: Acetaminophen (Tylenol), Serum: <10 ug/mL — ABNORMAL LOW (ref 10–30)

## 2010-04-25 LAB — ETHANOL
Alcohol, Ethyl (B): <5 mg/dL (ref 0–10)
Alcohol, Ethyl (B): <5 mg/dL (ref 0–10)
Alcohol, Ethyl (B): <5 mg/dL (ref 0–10)

## 2010-04-25 LAB — POCT PREGNANCY, URINE
Preg Test, Ur: NEGATIVE
Preg Test, Ur: NEGATIVE

## 2010-04-25 LAB — PREGNANCY, URINE: Preg Test, Ur: NEGATIVE

## 2010-04-26 LAB — POCT I-STAT, CHEM 8
BUN: 3 mg/dL — ABNORMAL LOW (ref 6–23)
BUN: 3 mg/dL — ABNORMAL LOW (ref 6–23)
BUN: 7 mg/dL (ref 6–23)
Calcium, Ion: 1.1 mmol/L — ABNORMAL LOW (ref 1.12–1.32)
Calcium, Ion: 1.17 mmol/L (ref 1.12–1.32)
Chloride: 112 mEq/L (ref 96–112)
Chloride: 112 mEq/L (ref 96–112)
Chloride: 113 mEq/L — ABNORMAL HIGH (ref 96–112)
Creatinine, Ser: 0.6 mg/dL (ref 0.4–1.2)
Creatinine, Ser: 0.6 mg/dL (ref 0.4–1.2)
Glucose, Bld: 104 mg/dL — ABNORMAL HIGH (ref 70–99)
Glucose, Bld: 105 mg/dL — ABNORMAL HIGH (ref 70–99)
HCT: 40 % (ref 36.0–46.0)
Potassium: 4 mEq/L (ref 3.5–5.1)
Potassium: 4.1 mEq/L (ref 3.5–5.1)
Sodium: 139 mEq/L (ref 135–145)
Sodium: 141 mEq/L (ref 135–145)
TCO2: 19 mmol/L (ref 0–100)

## 2010-04-26 LAB — CBC
Hemoglobin: 13 g/dL (ref 12.0–15.0)
MCHC: 33.5 g/dL (ref 30.0–36.0)
Platelets: 201 10*3/uL (ref 150–400)
RDW: 17.2 % — ABNORMAL HIGH (ref 11.5–15.5)

## 2010-04-26 LAB — RAPID URINE DRUG SCREEN, HOSP PERFORMED
Amphetamines: NOT DETECTED
Benzodiazepines: POSITIVE — AB
Benzodiazepines: POSITIVE — AB
Cocaine: NOT DETECTED
Cocaine: NOT DETECTED
Opiates: NOT DETECTED
Tetrahydrocannabinol: NOT DETECTED

## 2010-04-26 LAB — DIFFERENTIAL
Basophils Absolute: 0.3 10*3/uL — ABNORMAL HIGH (ref 0.0–0.1)
Basophils Relative: 3 % — ABNORMAL HIGH (ref 0–1)
Eosinophils Relative: 3 % (ref 0–5)
Lymphocytes Relative: 42 % (ref 12–46)
Monocytes Absolute: 0.6 10*3/uL (ref 0.1–1.0)
Neutro Abs: 3.6 10*3/uL (ref 1.7–7.7)

## 2010-04-26 LAB — URINALYSIS, ROUTINE W REFLEX MICROSCOPIC
Bilirubin Urine: NEGATIVE
Glucose, UA: NEGATIVE mg/dL
Hgb urine dipstick: NEGATIVE
Ketones, ur: NEGATIVE mg/dL
Specific Gravity, Urine: 1.004 — ABNORMAL LOW (ref 1.005–1.030)
pH: 6.5 (ref 5.0–8.0)

## 2010-04-26 LAB — ETHANOL: Alcohol, Ethyl (B): <5 mg/dL (ref 0–10)

## 2010-04-27 LAB — POCT I-STAT, CHEM 8
BUN: <3 mg/dL — ABNORMAL LOW (ref 6–23)
Chloride: 108 mEq/L (ref 96–112)
Creatinine, Ser: 0.7 mg/dL (ref 0.4–1.2)
Sodium: 140 mEq/L (ref 135–145)

## 2010-04-27 LAB — CBC
Platelets: 258 10*3/uL (ref 150–400)
RBC: 4.43 MIL/uL (ref 3.87–5.11)
WBC: 9.2 10*3/uL (ref 4.0–10.5)

## 2010-04-29 LAB — POCT I-STAT, CHEM 8
Glucose, Bld: 97 mg/dL (ref 70–99)
HCT: 38 % (ref 36.0–46.0)
Hemoglobin: 12.9 g/dL (ref 12.0–15.0)
Potassium: 3.8 mEq/L (ref 3.5–5.1)
TCO2: 21 mmol/L (ref 0–100)

## 2010-04-30 LAB — BASIC METABOLIC PANEL
BUN: 6 mg/dL (ref 6–23)
BUN: 6 mg/dL (ref 6–23)
CO2: 21 mEq/L (ref 19–32)
Calcium: 8.5 mg/dL (ref 8.4–10.5)
Chloride: 106 mEq/L (ref 96–112)
Creatinine, Ser: 0.78 mg/dL (ref 0.4–1.2)
GFR calc non Af Amer: >60 mL/min (ref >60)
GFR calc non Af Amer: >60 mL/min (ref >60)
Glucose, Bld: 87 mg/dL (ref 70–99)
Glucose, Bld: 89 mg/dL (ref 70–99)
Potassium: 3.7 mEq/L (ref 3.5–5.1)
Sodium: 132 mEq/L — ABNORMAL LOW (ref 135–145)
Sodium: 137 mEq/L (ref 135–145)

## 2010-04-30 LAB — POCT I-STAT, CHEM 8
Calcium, Ion: 1.07 mmol/L — ABNORMAL LOW (ref 1.12–1.32)
Creatinine, Ser: 0.9 mg/dL (ref 0.4–1.2)
Glucose, Bld: 68 mg/dL — ABNORMAL LOW (ref 70–99)
HCT: 37 % (ref 36.0–46.0)
HCT: 38 % (ref 36.0–46.0)
Hemoglobin: 12.6 g/dL (ref 12.0–15.0)
Hemoglobin: 12.9 g/dL (ref 12.0–15.0)
Potassium: 3.7 mEq/L (ref 3.5–5.1)
Sodium: 137 mEq/L (ref 135–145)
TCO2: 20 mmol/L (ref 0–100)

## 2010-04-30 LAB — RAPID URINE DRUG SCREEN, HOSP PERFORMED
Amphetamines: NOT DETECTED
Barbiturates: NOT DETECTED
Benzodiazepines: POSITIVE — AB
Benzodiazepines: POSITIVE — AB
Benzodiazepines: POSITIVE — AB
Cocaine: NOT DETECTED
Cocaine: NOT DETECTED
Cocaine: NOT DETECTED
Opiates: NOT DETECTED
Tetrahydrocannabinol: NOT DETECTED
Tetrahydrocannabinol: NOT DETECTED

## 2010-04-30 LAB — ETHANOL
Alcohol, Ethyl (B): <5 mg/dL (ref 0–10)
Alcohol, Ethyl (B): <5 mg/dL (ref 0–10)

## 2010-04-30 LAB — CBC
HCT: 36.7 % (ref 36.0–46.0)
Hemoglobin: 11.6 g/dL — ABNORMAL LOW (ref 12.0–15.0)
Hemoglobin: 12.5 g/dL (ref 12.0–15.0)
MCHC: 33.7 g/dL (ref 30.0–36.0)
MCV: 86.8 fL (ref 78.0–100.0)
Platelets: 234 10*3/uL (ref 150–400)
Platelets: 291 10*3/uL (ref 150–400)
RDW: 16.4 % — ABNORMAL HIGH (ref 11.5–15.5)
RDW: 16.8 % — ABNORMAL HIGH (ref 11.5–15.5)
WBC: 9.2 10*3/uL (ref 4.0–10.5)

## 2010-04-30 LAB — URINALYSIS, ROUTINE W REFLEX MICROSCOPIC
Bilirubin Urine: NEGATIVE
Ketones, ur: NEGATIVE mg/dL
Leukocytes, UA: NEGATIVE
Nitrite: NEGATIVE
Protein, ur: NEGATIVE mg/dL
Urobilinogen, UA: 0.2 mg/dL (ref 0.0–1.0)

## 2010-04-30 LAB — POCT PREGNANCY, URINE: Preg Test, Ur: NEGATIVE

## 2010-04-30 LAB — ACETAMINOPHEN LEVEL: Acetaminophen (Tylenol), Serum: <10 ug/mL — ABNORMAL LOW (ref 10–30)

## 2010-04-30 LAB — DIFFERENTIAL
Basophils Absolute: 0.1 10*3/uL (ref 0.0–0.1)
Basophils Relative: 1 % (ref 0–1)
Lymphocytes Relative: 36 % (ref 12–46)
Neutro Abs: 5.1 10*3/uL (ref 1.7–7.7)
Neutrophils Relative %: 55 % (ref 43–77)

## 2010-04-30 LAB — PREGNANCY, URINE: Preg Test, Ur: NEGATIVE
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
## 2010-06-02 NOTE — H&P (Signed)
[name], [name]            ACCOUNT NO.:  [id number]   MEDICAL RECORD NO.:  [id number]          PATIENT TYPE:  IPS   LOCATION:  0503                          FACILITY:  BH   PHYSICIAN:  Anselm Jungling, MD  DATE OF BIRTH:  [birth year]   DATE OF ADMISSION:  11/22/2006  DATE OF DISCHARGE:  11/25/2006                       PSYCHIATRIC ADMISSION ASSESSMENT   PRIMARY CARE Lore Polka:  Dr. Royal Hawthorn in [place].   HISTORY OF PRESENT ILLNESS:  The patient is here with a history of  depression and suicidal thoughts.  States that she is a patient at [facility].  She states that she has a history of psychosis,  experiencing auditory and visual hallucinations, seeing people, history  of PTSD, having problems with chronic pain and migraines.  States that  she has been in and out of the emergency department and her primary care  Pricella Gaugh to get pain medications receiving injections and IV  medications.  She states that when her pain is not controlled, she gets  psychotic.  States that she gets into the fetal positions into the  corner and is currently requesting something for pain.   PAST PSYCHIATRIC HISTORY:  This is the first admission to the [facility].  She sees Dr. Cheree Ditto at [facility].   SOCIAL HISTORY:  This is a 45 year old single female.  She lives with  her mother.  She is on disability.  The patient denies any alcohol or  drug use.   FAMILY HISTORY:  None that were aware of.   ALCOHOL/DRUG HISTORY:  Denies any alcohol or drug use.   MEDICAL PROBLEMS:  1. Chronic pain.  2. Migraine headaches.   MEDICATIONS:  1. The patient has been on trazodone 50 mg 1 p.o. b.i.d.  2. Trazodone 50 mg taking 3 at bedtime.  3. Effexor XR 115 mg 1 daily.  4. Effexor XR 37.5 mg 1 daily.  5. Perphenazine 4 mg 1 b.i.d.; 4 mg 2 at bedtime.  6. Gabapentin 300 mg 1 p.o. t.i.d.  7. Stadol spray p.r.n. for headaches.  8. Xanax 1 mg 1 p.o. t.i.d.  9.  Synthroid 100 mcg 1 daily.  10.Hydrocodone 10/650 4 tablets daily.  11.Phenergan 25 mg suppository p.r.n.  12.Lipitor 20 mg 1 q.h.s.   DRUG ALLERGIES:  1. COMPAZINE.  2. HALDOL.  3. TORADOL.  4. IMITREX.  5. WELLBUTRIN.  6. PENICILLIN.   PHYSICAL EXAMINATION:  GENERAL:  This is a middle-aged female, who  appears in no acute distress.  However, she is complaining of pain.  She  was fully assessed at [facility] where she received  Dilaudid 2 mg IV push, Zofran 4 mg IV push, Ativan 1 mg IV push.  She  received IV fluids.  VITAL SIGNS:  Temperature is 98.5, 78 heart rate, 20 respirations, blood  pressure is 138/68, 99% saturation, 140 pounds.   LABORATORY DATA:  Her urine pregnancy test is negative.  Urinalysis is  negative.  Urine drug screen is positive for benzodiazepines, positive  for opiates.  Alcohol level is less than 5.   MENTAL STATUS EXAM:  She is fully alert, cooperative, good eye contact.  She is dressed in hospital scrubs at this time.  Speech is clear, normal  pace and tone.  The patient __________  The patient's affect is flat.  Her thought processes, the patient is endorsing a possibility of having  visual hallucinations when her pain is out of control.  She does not  appear to be responding to internal stimuli.  No apparent delusional  statements.  Cognitive function intact.  Her memory is good.  Judgment  is impaired.  Insight is minimal.   AXIS I:  Rule out psychosis, not otherwise specified.  Mood disorder,  not otherwise specified.  Rule out opiate abuse.  History of  posttraumatic stress disorder.  AXIS II:  Deferred.  AXIS III:  Chronic pain, headaches, hypothyroidism and  hypercholesteremia.  AXIS IV:  Psychosocial problems.  Medical problems.  AXIS V:  Current is 35.   PLAN:  1. Plans to contract for safety.  2. Stabilize mood and thinking.  3. We will resume her medications, but clarify medications with her      pharmacy and with  her primary care Romir Klimowicz for pain management      regimen.  4. The patient will increase her coping skills.  5. The patient is to follow up with Dr. Royal Hawthorn as advised.  6. Her tentative length of stay is 3-5 days.      Landry Corporal, N.P.      Anselm Jungling, MD  Electronically Signed    JO/MEDQ  D:  11/24/2006  T:  11/25/2006  Job:  [id number]

## 2010-06-02 NOTE — H&P (Signed)
[name], [name]            ACCOUNT NO.:  [id number]   MEDICAL RECORD NO.:  [id number]          PATIENT TYPE:  IPS   LOCATION:  0407                          FACILITY:  [facility]   PHYSICIAN:  Anselm Jungling, MD  DATE OF BIRTH:  1965/11/24   DATE OF ADMISSION:  07/20/2007  DATE OF DISCHARGE:                       PSYCHIATRIC ADMISSION ASSESSMENT   IDENTIFYING INFORMATION:  This is a 45 year old female voluntarily  admitted on July 20, 2007.   HISTORY OF PRESENT ILLNESS:  The patient presents with a history of a  self-inflicted injury.  Cut herself after she had had a 3-hour session  with her therapist, reliving some trauma in her past.  The patient was  feeling very panicky and anxious.  Had brought herself in for safety  issues.  She has not been sleeping well.  She reports she has been  compliant with medications.  She did stop one of her psychotropic  medications.  She was feeling more anxious being on the medication.   PAST PSYCHIATRIC HISTORY:  The patient has had several admissions  recently and was here at the end of June 2009.  Her therapist is Zenovia Jarred.  Her psychiatrist is Dr. Cheree Ditto.   SOCIAL HISTORY:  This is a 45 year old female who lives with her mother.  She is on disability.  No legal problems.   FAMILY HISTORY:  None.   ALCOHOL AND DRUG HISTORY:  She denies any alcohol or drug use.   PRIMARY CARE Daksh Coates:  Dr. Royal Hawthorn, at phone number [phone].   MEDICAL PROBLEMS:  1. Hypothyroidism.  2. Migraine headaches.   MEDICATIONS LISTED:  1. Trazodone 400 mg at bedtime.  2. Effexor XR 150 mg the morning.  3. Neurontin 600 mg t.i.d.  4. Vistaril 25 mg p.r.n.  5. Perphenazine 4 mg twice a day.  6. Phenergan suppository 25 mg twice a day p.r.n.  7. Klonopin 0.5 mg b.i.d.  8. Fioricet to p.r.n. for headaches.  9. Midrin q.4 h.  10.Synthroid 150 mg daily.  11.Verapamil 240 mg daily.   DRUG ALLERGIES LISTED:  1. PENICILLIN.  2. IMITREX.  3. WELLBUTRIN.  4. SOLU-MEDROL.  5. HALDOL.  6. TORADOL.  7. STADOL.  8. COMPAZINE.  9. GEODON.   REVIEW OF SYSTEMS:  Significant for hypothyroidism, positive for  headaches.  No seizures.  No falls.  PSYCHIATRIC:  Positive for  depression, positive for his anxiety, and self-inflicted injuries.   PHYSICAL EXAMINATION:  VITAL SIGNS:  Her temperature is 97.3, 82 heart  rate, 16 respirations, blood pressure is 112/65.  GENERAL:  This is a middle-aged female, tearful.  She is unkempt.  HEENT:  Head is atraumatic.  NECK:  Negative lymphadenopathy.  CHEST:  Clear.  BREASTS:  Deferred.  HEART:  Regular rate and rhythm.  No murmurs, gallops, or rubs.  ABDOMEN:  Soft, nontender abdomen.  PELVIC/GU:  Deferred.  EXTREMITIES:  The patient moves all extremities.  No clubbing, no deformities.  5+ against resistance.  SKIN:  Warm and dry.  She does have superficial lacerations to both her  forearms, with the dressings intact.  All self-inflicted injuries are  superficial,  three on her right arm, two on her left.  The patient also  has tattoos noted to her abdomen, noted on her nursing skin assessment.   LABORATORIES:  No labs are available at this time.   MENTAL STATUS EXAM:  This is a fully alert, middle-aged female, again  tearful, good eye contact.  Again unkempt.  Her speech is clear, normal  pace and tone.  The patient's mood is anxious.  Her affect:  The patient  also appears anxious, tearful, rocking her legs.  Thought processes are  coherent, goal directed.  No evidence of any suicidal thoughts.  Asking  to be placed back on the other hall and to have her one-to-one  discontinued.  Feels regretful for trying to hurt herself on the unit.  Cognitive function intact.  Her memory is good.  Judgment is good,  insight is partial.  Poor impulse control in regards to self-inflicted  injury although she does promise again safety at this time.   ASSESSMENT:   AXIS I:  Mood disorder, not otherwise  specified.  Posttraumatic stress disorder, per patient.   AXIS II:  Borderline personality disorder.   AXIS III:  Hypothyroidism.  Migraines.   AXIS IV:  Other psychosocial problems.   AXIS V:  Current is 35.   PLAN:  Stabilize her mood and thinking.  Contract for safety.  Will  resume her medications.  Will continue the one-to-one for her at this  time until the patient develops more insight and continues to promise  safety.  Will continue to explore supports.  The patient will need to  continue with her therapy.  Her estimated length of stay is 3-5 days at  this time.      Landry Corporal, N.P.      Anselm Jungling, MD  Electronically Signed    JO/MEDQ  D:  07/21/2007  T:  07/21/2007  Job:  [id number]

## 2010-06-02 NOTE — Discharge Summary (Signed)
NAMEMATTISYN, CARDONA            ACCOUNT NO.:  [id number]   MEDICAL RECORD NO.:  [id number]          PATIENT TYPE:  IPS   LOCATION:  0307                          [facility]ILITY[facility]  [facility]   PHYSICIAN:  Jasmine Pang, M.D. DATE OF BIRTH:  [birth year]   DATE OF ADMISSION:  07/29/2007  DATE OF DISCHARGE:  07/31/2007                               DISCHARGE SUMMARY   IDENTIFICATION:  This is a 45 year old single white female who was  admitted on an involuntary basis on July 29, 2007.   HISTORY OF PRESENT ILLNESS:  The commitment papers indicate that the  patient was recently released on July 7th from our facility.  She  apparently resumed her self-injurious behaviors with cut into her left  arm.  She was unable to maintain safety at home.  She states she has  PTSD, anxiety, and chronic pain syndrome, which are acting up.  Since  her discharge, [facility]  [place].  She states that the Seroquel 50 mg q.i.d. was instituted and  wanted [place] to add that to her current regimen.   PAST PSYCHIATRIC HISTORY:  The patient has a number of admissions here  recently.  Her outpatient psychiatrist is Dr. Cheree Ditto.  Her therapist is  Zenovia Jarred.  As already stated, she was just here the other day, July  2nd to July 7th.  She is currently on trazodone 400 mg q.h.s.,  Wellbutrin XR 150 mg q.a.m., perphenazine 4 mg p.o. b.i.d., Vistaril 25  mg p.o. b.i.d., Klonopin 0.5 mg p.o. b.i.d., and Seroquel 50 mg p.o.  q.i.d.   FAMILY HISTORY:  Negative.   ALCOHOL AND DRUG HISTORY:  She denies alcohol or drug use.  Her UDS is  positive for barbiturates, benzodiazepines, and acetaminophen.  However,  she is prescribed these.  Her CBC had no abnormal values.  She had no  alcohol and her metabolic had no abnormal.   MEDICAL PROBLEMS:  Hypothyroidism, migraine headaches, recent surgery on  left jaw for osteomyelitis.   MEDICATIONS:  The patient is currently prescribed,  1.  Trazodone 400 mg p.o. q.h.s.  2. Effexor XR 150 mg q.a.m.  3. Gabapentin 600 mg p.o. t.i.d.  4. Perphenazine 4 mg p.o. b.i.d.  5. Vistaril 25 mg p.o. b.i.d.  6. Klonopin 0.5 mg p.o. b.i.d.  7. Fioricet 1 tablet p.o. as needed for migraines  8. Midrin 1 tablet q.4 h. p.r.n. migraine.  9. Lipitor 20 mg p.o. q.h.s.  10.Synthroid 150 mcg p.o. q.a.m.  11.Verapamil 240 mg p.o. q.a.m.  12.Seroquel 50 mg p.o. q.i.d.   DRUG ALLERGIES:  Numerous.  She is allergic to PENICILLIN, IMITREX,  WELLBUTRIN, SOLU-MEDROL, HALDOL, TORADOL, STADOL, COMPAZINE, and GEODON.   PHYSICAL FINDINGS:  The patient was medically cleared at the ED in  [facility].  There were no acute medical physical problems noted.  She had a reopened self-inflicted laceration, it was closed with skin  glue and she had some butterfly bandages on top of it.   ADMISSION LABORATORIES:  As indicated earlier, UDS was positive for  barbiturates, benzodiazepines, and acetaminophen.  However, she is  prescribed these.  Her CBC had no abnormal values.  She had no alcohol.  Her metabolic panel had no abnormal values.   HOSPITAL COURSE:  Upon admission, the patient was restarted on her home  medications of Effexor XR 150 mg daily, gabapentin 600 mg p.o. t.i.d.,  perphenazine 4 mg p.o. b.i.d., Vistaril 25 mg p.o. q.6 h. p.r.n.  anxiety, Klonopin 0.5 mg p.o. b.i.d., Fioricet 1 tablet p.r.n. for  migraines, Midrin 1 tablet p.o. q.4 h. p.r.n. migraine, Lipitor 20 mg  p.o. q.h.s., Synthroid 150 mcg daily, verapamil 240 mg daily.  On July 29, 2007, the patient was started on trazodone 200 mg p.o. q.h.s.  In  individual sessions with me, the patient was friendly and cooperative.  She was familiar with me from her previous admission.  She discussed her  reason for being in the hospital.  She stated that she was becoming more  depressed and began to cut on herself.  She says however, she was not  intending to keep harming herself.  She felt  that the hospitalization  was a mistake.  She said her mother did not want her to be in the  hospital, but the doctor at the ED, who took care of her lacerations,  had her committed anyway.  She denies suicidal ideation and was  requesting to go home.  On July 31, 2007, mental status was improved.  Sleep was good.  Appetite was good.  Mood was less depressed, less  anxious.  Affect consistent with mood.  There was no suicidal or  homicidal ideation.  No auditory or visual hallucinations.  No paranoia  or delusions.  Thoughts were logical and goal-directed.  Thought  content, no predominant theme.  Cognitive was grossly intact.  The  trazodone was changed to 100 mg p.o. q.a.m. and 100 mg q. 1 p.m., and  200 mg q.h.s.  She stated that this helped her anxiety symptoms better.   DISCHARGE DIAGNOSES:  Axis I:  Mood disorder, not otherwise specified  and also post-traumatic stress disorder.  Axis II:  Borderline personality disorder.  Axis III:  Hypothyroidism, migraine headaches.  Axis IV:  Moderate(burden of psychiatric illness, psychosocial  instability).  Axis V:  Global assessment of functioning was 50 at the time of  discharge.  GAF was 30 upon admission.  GAF highest past year was 55-60.   DISCHARGE PLAN:  There was no specific activity level or dietary  restrictions.   POSTHOSPITAL CARE PLANS:  The patient will go to [facility] on August 02, 2007, for followup treatment.  She will also continue therapy with Zenovia Jarred, her therapist.   DISCHARGE MEDICATIONS:  1. Lipitor 20 mg at bedtime.  2. Seroquel 50 mg 4 times daily.  3. Trazodone 200 mg at bedtime and 100 mg in the a.m., and 1 p.m.  4. Klonopin 0.5 mg twice daily.  5. Neurontin 600 mg 3 times daily.  6. Levothyroxine 150 mcg daily.  7. Perphenazine 4 mg twice daily.  8. Effexor XR 150 mg daily.  9. Verapamil 240 mg daily.  10.Vistaril 25 mg every 6 hours as needed for anxiety.   She is also to take the Fioricet and  Midrin as directed by her regular  doctor.      Jasmine Pang, M.D.  Electronically Signed     BHS/MEDQ  D:  07/31/2007  T:  08/01/2007  Job:  [id number]
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
## 2010-06-02 NOTE — H&P (Signed)
NAMEMOMO, BRAUN            ACCOUNT NO.:  [id number]   MEDICAL RECORD NO.:  [id number]          PATIENT TYPE:  IPS   LOCATION:  0401                          FACILITY:  [facility]   PHYSICIAN:  Anselm Jungling, MD  DATE OF BIRTH:  [birth year]   DATE OF ADMISSION:  07/15/2007  DATE OF DISCHARGE:  07/16/2007                       PSYCHIATRIC ADMISSION ASSESSMENT   COMBINED HISTORY AND DISCHARGE SUMMARY   This was a voluntary admission to the services of Dr. Geralyn Flash.   DIAGNOSES:   AXIS I:  Bipolar 1, most recent episode manic, unspecified.   AXIS II:  Borderline personality disorder.   AXIS III:  1. Status post a recent jaw infection.  2. Issues with migraines and thyroid.  3. Also has bruised her right hand from intentionally hitting a wall.   AXIS IV:  Severe psychosocial.   AXIS V:  Fifty-five.   The patient came in on June 27th.  Apparently, she had had an incident  when she was attending IOP on Friday.  Unfortunately, she did not  utilize her coping skills well.  She punched a wall.  She has her right  hand very black and blue.  She was seen in the emergency department over  at [facility], and she stated, you know, she had bruised her right hand  after hitting the wall, she had been seen the day before in the ED for  anxiety, the same stuff, the patient states she feels like she wants to  hurt herself, hitting, cutting, just like always.  After being admitted  to the unit, the patient's mother called and indicated that she wanted  to have her daughter discharged and the patient was requesting  discharge.  She was told that it would have to wait until this morning.  She was seen by Dr. Dub Mikes, who felt it was reasonable to go ahead and  discharge her.  The plan is she will return to IOP in the morning and  she will continue with her current medications.  She was last with [place]  June 17 to June 21, and those meds were continued as per her discharge  sheet on June 21  and that would be Klonopin 0.5 mg b.i.d. p.r.n., Bentyl  20 mg b.i.d., Trazodone 400 mg at bedtime, Verapamil 240 mg p.o. daily,  Zocor 40 mg at bedtime, Synthroid 150 mcg p.o. daily, Neurontin 600 mg  t.i.d., Trilafon  4 mg b.i.d., 8 mg at bedtime, Effexor-XR 75 mg p.o.  daily, and Geodon 80 mg b.i.d. and at bedtime, please give with food,  and we gave her some Phenergan 25 mg p.o. or per rectum q.4h p.r.n. her  symptoms.   MENTAL STATUS EXAM:  Today, she is alert and oriented.  She is  appropriately groomed, dressed, and nourished.  Mood is appropriate to  the situation.  Affect has a normal range.  Her speech is a normal rate,  rhythm, and tone.  She denies any suicidal or homicidal ideation.  She  denies any auditory or visual hallucinations.  There  was no evidence for a delusional thought or paranoia.  Judgment and  insight are fair.  Concentration and memory are intact.  Intelligence is  at least average.   The plan is for her to return home as requested and to restarted IOP on  Monday, June 29th.      Mickie Leonarda Salon, P.A.-C.      Anselm Jungling, MD  Electronically Signed    MD/MEDQ  D:  07/16/2007  T:  07/16/2007  Job:  [id number]
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
## 2010-06-02 NOTE — Discharge Summary (Signed)
NAMEBESSYE, STITH NO.:  [id number]   MEDICAL RECORD NO.:  [id number]          PATIENT TYPE:  IPS   LOCATION:  0305                          FACILITY:  [facility]   PHYSICIAN:  Jasmine Pang, M.D. DATE OF BIRTH:  [birth year]   DATE OF ADMISSION:  06/21/2007  DATE OF DISCHARGE:  06/22/2007                               DISCHARGE SUMMARY   IDENTIFICATION:  This is a 45 year old white female from [place]  who was admitted on a voluntary basis on June 21, 2007.  She states her  mother took to the ED because she was making suicidal statements.  She  has been trying to get off all her opiates.  The doctor put her on  clonidine detox protocol.  She sees Dr. Eulah Pont psychiatrist.  She was  seeing a therapist.  She had been an addict for many years, but had been  recovering for 19 years until she had jaw surgery.  She reports being  anxious.   PAST PSYCHIATRIC HISTORY:  The patient was here 1 year ago.  She sees  Dr. Eulah Pont for outpatient therapy.   ALCOHOL AND DRUG HISTORY:  History of substance abuse history in the  past of crystal meth and cocaine.  Currently, has been abusing opiates.   MEDICAL PROBLEMS:  Osteomyelitis.  She has had 3 jaw surgeries  medically.   DRUG ALLERGIES:  Penicillin.   PHYSICAL FINDINGS:  There were no acute physical or medical problems  noted.  She was held in the ED.  She did have scratches on her wrist  from self-inflicted scratching.   HOSPITAL COURSE:  Upon  admission, the patient was initially very  agitated about being here.  She did not want to be in the hospital.  She  stated she would do well without the hospitalization and was not  suicidal.  She felt she had not been using opiates long enough to get  off to need a detox protocol.  She was placed on Librium detox protocol  and the clonidine detox protocol.  She was also placed on her home  medications.  She was also placed on Effexor XR 37.5 mg p.o. b.i.d.,  perphenazine 4 mg b.i.d., which is her current dose.  She was also  placed on Neurontin 300 mg b.i.d. and h.s.  The patient continued to  want to go home.  In a phone call from case manager to her father, he  felt there was no concerns with the patient going home.  Father planned  to transport her home.  She was going to follow up at [facility].  She  was discharged home.   DISCHARGE DIAGNOSES:  Axis I:  Mood disorder, not otherwise specified.  Opiate dependence, partial remission.  Axis II:  None.  Axis III:  Osteomyelitis.  Axis IV:  Moderate (problems with primary support group, medical  problems, burden of psychiatric and chemical dependence illness).  Axis V:  Global assessment of functioning upon discharge was 55.  GAF  upon admission 40-55.  GAF highest past year was 60-65.   DISCHARGE PLANS:  There was no specific activity  level or dietary  restriction.   POSTHOSPITAL CARE PLANS:  The patient will be seen at [facility] on Friday June 25, 2007, at 9:00 a.m.   DISCHARGE MEDICATIONS:  She was to resume the discharge medications that  were prescribed by her primary care doctor including:  1. Toradol 10 mg q.i.d.  2. Bentyl 20 mg t.i.d.  3. Clonidine 0.1 mg b.i.d.  4. Effexor XR 75 mg daily.  5. Phenergan 25 mg p.r.n., nausea.  6. Klonopin 0.5 mg p.o. b.i.d.  7. Perphenazine 4 mg b.i.d. and 8 mg at bedtime.  8. Fioricet with Codeine 1 capsule daily.  9. Fioricet 1 tablet p.r.n., headache.  10.Trazodone 200 mg at bedtime.  11.Gabapentin 600 mg t.i.d.  12.Lipitor 20 mg at bedtime.  13.Verapamil ER 240 mg daily.      Jasmine Pang, M.D.  Electronically Signed     BHS/MEDQ  D:  07/13/2007  T:  07/13/2007  Job:  [id number]
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
## 2010-06-02 NOTE — H&P (Signed)
[name], [name] NO.:  [id number]   MEDICAL RECORD NO.:  [id number]          PATIENT TYPE:  IPS   LOCATION:  0307                          FACILITY:  [facility]   PHYSICIAN:  Geoffery Lyons, M.D.      DATE OF BIRTH:  31-May-1965   DATE OF ADMISSION:  07/29/2007  DATE OF DISCHARGE:                       PSYCHIATRIC ADMISSION ASSESSMENT   This is an involuntary admission to the services of Dr. Geoffery Lyons.   IDENTIFYING INFORMATION:  This is a 45 year old, single, white female.  The commitment papers indicate that she was recently released and that  would have been July 7th actually from our facility from the [facility], and she has now resumed her self-injurious behaviors with  cuts to her left arm.  The patient was unable to maintain safety at  home.  She states that PTSD, anxiety, and chronic pain syndrome are  acting up.  Since her discharge, she has already been seen [place] there in  [place] at the mental health center, and she states that Seroquel 50 mg  q.i.d. was instituted, and we will be happy to add that to her current  regime.   PAST PSYCHIATRIC HISTORY:  She has had a number of admissions here  recently.  Her outpatient psychiatrist is Dr. Cheree Ditto, her therapist is  Zenovia Jarred.  As already stated, she was just here the other day, July  02 to July 07.   SOCIAL HISTORY:  She is a 45 year old female, who lives with her mother.  She receives disability.  There are no known legal issues.   FAMILY HISTORY:  Negative.   ALCOHOL AND DRUG HISTORY:  She denies any alcohol or drug use.  Her UDS  is positive for barbiturates, benzos, and acetaminophen; however, she is  prescribed.  Her CBC had no abnormal values.  She had no alcohol, and  her metabolic had no abnormals.   PRIMARY CARE Tedford Berg:  Dr. Royal Hawthorn at [phone].   MEDICAL PROBLEMS:  1. Hypothyroidism.  2. Migraine headaches.   MEDICATIONS:  She is currently prescribed:  1. Trazodone 400  mg at h.s.  2. Effexor-XL 150 mg p.o. q.a.m.  3. Gabapentin 600 mg p.o. t.i.d.  4. Perphenazine 4 mg p.o. b.i.d.  5. Vistaril 25 mg p.o. b.i.d.  6. Klonopin 0.5 mg p.o. b.i.d.  7. Fioricet 1 tab p.o. as needed for migraine.  8. Midrin 1 tab p.o. q.4h as needed for migraine.  9. Lipitor 20 mg p.o. at h.s.  10.Synthroid 150 mcg p.o. q.a.m.  11.Verapamil 240 mg p.o. q.a.m.  12.Now Seroquel 50 mg p.o. q.i.d.   DRUG ALLERGIES:  Numerous.  She claims:  1. PENICILLIN.  2. IMITREX.  3. WELLBUTRIN.  4. SOLU-MEDROL.  5. HALDOL.  6. TORADOL.  7. STADOL.  8. COMPAZINE.  9. GEODON.   REVIEW OF SYSTEMS:  She remains symptomatic for headaches, no seizures,  and psychiatric.  She remains positive for flashbacks and anxiety, which  induce her to cut on herself.   PHYSICAL EXAMINATION:  She was medically cleared in the ED at [facility].  As already stated, she had no new physical  findings.  She had reopened a  prior self-inflicted laceration.  It was closed with skin glue, and she  has some butterfly bandages on top of it.  Her vital signs on admission  show she is 62 inches tall, weighs 156, her blood pressure is 127/103 to  131/105, pulse is 83 to 90, respirations are 20.   MENTAL STATUS EXAM:  Today, she is alert and oriented.  She is  appropriately, although casually, dressed in jeans and T-shirt.  She is  appropriately groomed and nourished.  Her speech is a normal rate,  rhythm, and tone.  Her mood is anxiously depressed.  Her affect is  congruent.  Her thought processes are clear, rational, and goal  oriented.  She is requesting discharge.  She wants to go to [facility].  She is expecting a phone call from them on Monday regarding getting into  their program.  Concentration and memory are intact.  Intelligence is at  least average.  She denies being suicidal or homicidal.  She contracts  for safety.  She does not need to be on one-to-one right now.  She  denies having auditory or visual  hallucinations.   DIAGNOSES:   AXIS I:  1. Mood disorder, not otherwise specified.  2. Posttraumatic stress disorder per patient.   AXIS II:  Borderline personality disorder.   AXIS III:  1. Hypothyroidism.  2. Migraine headache.   AXIS IV:  Severe chronic illness.   AXIS V:  Thirty.   The plan is to admit for safety and stabilization.  We will plan to  contact the [facility] on Monday.  The patient is requesting  discharge.  It was explained to her that with so many back to back  admissions that it is not advised.  We will contact [facility] on Monday  regarding her being able to be in their program and make a decision  about discharge after that.      Mickie Leonarda Salon, P.A.-C.      Geoffery Lyons, M.D.  Electronically Signed    MD/MEDQ  D:  07/29/2007  T:  07/29/2007  Job:  [id number]

## 2010-06-02 NOTE — Discharge Summary (Signed)
[name], [name]            ACCOUNT NO.:  [id number]   MEDICAL RECORD NO.:  [id number]          PATIENT TYPE:  IPS   LOCATION:  0406                          FACILITY:  [facility]   PHYSICIAN:  Anselm Jungling, MD  DATE OF BIRTH:  1965/12/27   DATE OF ADMISSION:  07/20/2007  DATE OF DISCHARGE:  07/25/2007                               DISCHARGE SUMMARY   IDENTIFYING DATA AND REASON FOR ADMISSION:  The patient is a 45 year old  Caucasian female, and this was one of several inpatient psychiatric  admissions for her.  She was transferred to the inpatient service from  the intensive outpatient psychiatric program where she had been  receiving ongoing treatment for mood disorder, and borderline  personality disorder.   In recent months, she has had several admissions to both the inpatient  program and the intensive outpatient program because of these  difficulties, and accompanying behavioral features that includes self-  inflicted cutting, and repeated urges to do so, and difficulty resisting  these urges.  This was the basis for this admission as well.  Please  refer to the admission note for further details pertaining to the  symptoms, circumstances and history that led to her hospitalization.  Please also refer to previous admission and discharge summary.   MEDICAL AND LABORATORY:  The patient came to [place] with a history of  hypothyroidism, and was also continued on her usual Zocor 40 mg daily,  Synthroid 115 mcg daily, and verapamil 240 kg daily.  She was medically  and physically assessed by the psychiatric nurse practitioner.  There  were no significant medical issues.  She did have a several superficial  self-inflicted cuts on her arms, which proceeded with healing normally  during her inpatient stay.   HOSPITAL COURSE:  The patient was admitted to the adult inpatient  psychiatric service.  She presented as a well-nourished, normally-  developed female who is alert and fully  oriented.  There were no signs  or symptoms of psychosis.  She denied active suicidal ideation.  With  regard to her episode of self-inflicted cutting, she stated I blew it  yesterday.  I cut myself like I am not supposed to do here.  I am just  frustrated.  She was tearful and remorseful.  She stated I have severe  PTSD, OCD, and a slight personality disorder.  She agreed sincerely to  a pledge to not harm herself any further during the inpatient stay, and  indicated that she would seek staff assistance if she felt that the  urges were irresistible.  She was given initial Axis I diagnosis of mood  disorder NOS, with an Axis II designation of borderline personality  disorder.   Initially, she was maintained on one-to-one observation.  She was  continued on her usual regimen of trazodone, Effexor, Trilafon,  Klonopin, and Neurontin.   She was a good participant in the treatment program, attending various  therapeutic groups and activities geared towards helping acquire better  coping skills, a better understanding of her underlying disorders and  dynamics, and the development of an aftercare safety plan.  She indicated on this occasion that she thought it would be in her best  interest to go to a group home or assisted living facility.  We felt  that this was a reasonable approach, and case management worked with the  patient towards this as a goal.   On the sixth hospital day, the patient was feeling confident of her  ability to resist her urge to harm herself.  She was discharged with a  plan to return to the intensive outpatient program.   AFTERCARE:  The patient was to be seen in the intensive outpatient  program, beginning the morning following her discharge, which occurred  on July 25, 2007.   DISCHARGE MEDICATIONS:  1. Trazodone 400 mg nightly.  2. Effexor XR 115 mg daily.  3. Neurontin 600 mg t.i.d.  4. Trilafon 4 mg b.i.d. and 8 mg nightly.  5. Klonopin 0.5 mg t.i.d.   6. Zocor 40 mg daily.  7. Synthroid 115 mcg daily.  8. Verapamil 240 mg daily.   DISCHARGE DIAGNOSES:  AXIS I:  Major depressive disorder, recurrent,  history of post-traumatic stress disorder.  AXIS II:  Borderline personality traits.  AXIS III:  History of hypothyroidism, hypertension.  AXIS IV:  Stressors severe.  AXIS V: GAF on discharge 50.      Anselm Jungling, MD  Electronically Signed     SPB/MEDQ  D:  08/01/2007  T:  08/01/2007  Job:  [id number]
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
## 2010-06-07 ENCOUNTER — Emergency Department (HOSPITAL_COMMUNITY)
Admission: EM | Admit: 2010-06-07 | Discharge: 2010-06-08 | Disposition: A | Discharge status: Home / Self Care | Payer: Medicare Other | Attending: Emergency Medicine | Admitting: Emergency Medicine

## 2010-06-07 DIAGNOSIS — F3289 Other specified depressive episodes: Secondary | ICD-10-CM | POA: Insufficient documentation

## 2010-06-07 DIAGNOSIS — F4310 Post-traumatic stress disorder, unspecified: Secondary | ICD-10-CM | POA: Insufficient documentation

## 2010-06-07 DIAGNOSIS — Z79899 Other long term (current) drug therapy: Secondary | ICD-10-CM | POA: Insufficient documentation

## 2010-06-07 DIAGNOSIS — E0590 Thyrotoxicosis, unspecified without thyrotoxic crisis or storm: Secondary | ICD-10-CM | POA: Insufficient documentation

## 2010-06-07 DIAGNOSIS — F329 Major depressive disorder, single episode, unspecified: Secondary | ICD-10-CM | POA: Insufficient documentation

## 2010-06-07 DIAGNOSIS — E7800 Pure hypercholesterolemia, unspecified: Secondary | ICD-10-CM | POA: Insufficient documentation

## 2010-06-07 LAB — CBC
MCV: 90.8 fL (ref 78.0–100.0)
Platelets: 237 10*3/uL (ref 150–400)
RDW: 13.2 % (ref 11.5–15.5)
WBC: 11.9 10*3/uL — ABNORMAL HIGH (ref 4.0–10.5)

## 2010-06-07 LAB — DIFFERENTIAL
Basophils Relative: 1 % (ref 0–1)
Eosinophils Absolute: 0.2 10*3/uL (ref 0.0–0.7)
Eosinophils Relative: 2 % (ref 0–5)
Lymphs Abs: 3.8 10*3/uL (ref 0.7–4.0)

## 2010-06-08 LAB — COMPREHENSIVE METABOLIC PANEL
Albumin: 3.1 g/dL — ABNORMAL LOW (ref 3.5–5.2)
Alkaline Phosphatase: 56 U/L (ref 39–117)
BUN: 7 mg/dL (ref 6–23)
Calcium: 8.8 mg/dL (ref 8.4–10.5)
Creatinine, Ser: 0.81 mg/dL (ref 0.4–1.2)
Glucose, Bld: 93 mg/dL (ref 70–99)
Potassium: 3.2 mEq/L — ABNORMAL LOW (ref 3.5–5.1)
Total Protein: 6.5 g/dL (ref 6.0–8.3)

## 2010-06-08 LAB — RAPID URINE DRUG SCREEN, HOSP PERFORMED
Amphetamines: NOT DETECTED
Barbiturates: NOT DETECTED
Benzodiazepines: POSITIVE — AB
Cocaine: NOT DETECTED
Opiates: NOT DETECTED
Tetrahydrocannabinol: NOT DETECTED

## 2010-06-08 LAB — ETHANOL: Alcohol, Ethyl (B): <11 mg/dL — ABNORMAL HIGH (ref 0–10)

## 2010-06-15 ENCOUNTER — Emergency Department (HOSPITAL_COMMUNITY)
Admission: EM | Admit: 2010-06-15 | Discharge: 2010-06-15 | Disposition: A | Discharge status: Home / Self Care | Payer: Medicare Other | Attending: Emergency Medicine | Admitting: Emergency Medicine

## 2010-06-15 DIAGNOSIS — R110 Nausea: Secondary | ICD-10-CM | POA: Insufficient documentation

## 2010-06-15 DIAGNOSIS — F3289 Other specified depressive episodes: Secondary | ICD-10-CM | POA: Insufficient documentation

## 2010-06-15 DIAGNOSIS — M542 Cervicalgia: Secondary | ICD-10-CM | POA: Insufficient documentation

## 2010-06-15 DIAGNOSIS — H53149 Visual discomfort, unspecified: Secondary | ICD-10-CM | POA: Insufficient documentation

## 2010-06-15 DIAGNOSIS — E7800 Pure hypercholesterolemia, unspecified: Secondary | ICD-10-CM | POA: Insufficient documentation

## 2010-06-15 DIAGNOSIS — G43909 Migraine, unspecified, not intractable, without status migrainosus: Secondary | ICD-10-CM | POA: Insufficient documentation

## 2010-06-15 DIAGNOSIS — F329 Major depressive disorder, single episode, unspecified: Secondary | ICD-10-CM | POA: Insufficient documentation

## 2010-06-15 DIAGNOSIS — E0590 Thyrotoxicosis, unspecified without thyrotoxic crisis or storm: Secondary | ICD-10-CM | POA: Insufficient documentation

## 2010-06-18 ENCOUNTER — Emergency Department (HOSPITAL_COMMUNITY)
Admission: EM | Admit: 2010-06-18 | Discharge: 2010-06-18 | Disposition: A | Discharge status: Home / Self Care | Payer: Medicare Other | Attending: Emergency Medicine | Admitting: Emergency Medicine

## 2010-06-18 DIAGNOSIS — F3289 Other specified depressive episodes: Secondary | ICD-10-CM | POA: Insufficient documentation

## 2010-06-18 DIAGNOSIS — Z79899 Other long term (current) drug therapy: Secondary | ICD-10-CM | POA: Insufficient documentation

## 2010-06-18 DIAGNOSIS — E0590 Thyrotoxicosis, unspecified without thyrotoxic crisis or storm: Secondary | ICD-10-CM | POA: Insufficient documentation

## 2010-06-18 DIAGNOSIS — G43909 Migraine, unspecified, not intractable, without status migrainosus: Secondary | ICD-10-CM | POA: Insufficient documentation

## 2010-06-18 DIAGNOSIS — E7800 Pure hypercholesterolemia, unspecified: Secondary | ICD-10-CM | POA: Insufficient documentation

## 2010-06-18 DIAGNOSIS — R112 Nausea with vomiting, unspecified: Secondary | ICD-10-CM | POA: Insufficient documentation

## 2010-06-18 DIAGNOSIS — H53149 Visual discomfort, unspecified: Secondary | ICD-10-CM | POA: Insufficient documentation

## 2010-06-18 DIAGNOSIS — F329 Major depressive disorder, single episode, unspecified: Secondary | ICD-10-CM | POA: Insufficient documentation

## 2010-06-20 ENCOUNTER — Emergency Department (HOSPITAL_COMMUNITY)
Admission: EM | Admit: 2010-06-20 | Discharge: 2010-06-20 | Discharge status: Against Medical Advice | Payer: Medicare Other | Attending: Emergency Medicine | Admitting: Emergency Medicine

## 2010-06-20 DIAGNOSIS — R1110 Vomiting, unspecified: Secondary | ICD-10-CM | POA: Insufficient documentation

## 2010-06-20 DIAGNOSIS — E7800 Pure hypercholesterolemia, unspecified: Secondary | ICD-10-CM | POA: Insufficient documentation

## 2010-06-20 DIAGNOSIS — H53149 Visual discomfort, unspecified: Secondary | ICD-10-CM | POA: Insufficient documentation

## 2010-06-20 DIAGNOSIS — R51 Headache: Secondary | ICD-10-CM | POA: Insufficient documentation

## 2010-06-20 DIAGNOSIS — E0590 Thyrotoxicosis, unspecified without thyrotoxic crisis or storm: Secondary | ICD-10-CM | POA: Insufficient documentation

## 2010-06-22 LAB — POCT I-STAT, CHEM 8
BUN: 27 mg/dL — ABNORMAL HIGH (ref 6–23)
Chloride: 99 mEq/L (ref 96–112)
Glucose, Bld: 97 mg/dL (ref 70–99)
HCT: 54 % — ABNORMAL HIGH (ref 36.0–46.0)
Potassium: 4.3 mEq/L (ref 3.5–5.1)

## 2010-07-19 ENCOUNTER — Emergency Department (HOSPITAL_COMMUNITY)
Admission: EM | Admit: 2010-07-19 | Discharge: 2010-07-19 | Disposition: A | Discharge status: Home / Self Care | Payer: Medicare Other | Attending: Emergency Medicine | Admitting: Emergency Medicine

## 2010-07-19 DIAGNOSIS — Z79899 Other long term (current) drug therapy: Secondary | ICD-10-CM | POA: Insufficient documentation

## 2010-07-19 DIAGNOSIS — R51 Headache: Secondary | ICD-10-CM | POA: Insufficient documentation

## 2010-07-19 DIAGNOSIS — F341 Dysthymic disorder: Secondary | ICD-10-CM | POA: Insufficient documentation

## 2010-07-19 DIAGNOSIS — H53149 Visual discomfort, unspecified: Secondary | ICD-10-CM | POA: Insufficient documentation

## 2010-07-19 DIAGNOSIS — E0590 Thyrotoxicosis, unspecified without thyrotoxic crisis or storm: Secondary | ICD-10-CM | POA: Insufficient documentation

## 2010-07-19 DIAGNOSIS — E7800 Pure hypercholesterolemia, unspecified: Secondary | ICD-10-CM | POA: Insufficient documentation

## 2010-07-19 DIAGNOSIS — R112 Nausea with vomiting, unspecified: Secondary | ICD-10-CM | POA: Insufficient documentation

## 2010-08-03 ENCOUNTER — Emergency Department (HOSPITAL_COMMUNITY)
Admission: EM | Admit: 2010-08-03 | Discharge: 2010-08-03 | Disposition: A | Discharge status: Home / Self Care | Payer: Medicare Other | Attending: Emergency Medicine | Admitting: Emergency Medicine

## 2010-08-03 DIAGNOSIS — E7800 Pure hypercholesterolemia, unspecified: Secondary | ICD-10-CM | POA: Insufficient documentation

## 2010-08-03 DIAGNOSIS — F3289 Other specified depressive episodes: Secondary | ICD-10-CM | POA: Insufficient documentation

## 2010-08-03 DIAGNOSIS — R112 Nausea with vomiting, unspecified: Secondary | ICD-10-CM | POA: Insufficient documentation

## 2010-08-03 DIAGNOSIS — F329 Major depressive disorder, single episode, unspecified: Secondary | ICD-10-CM | POA: Insufficient documentation

## 2010-08-03 DIAGNOSIS — E0590 Thyrotoxicosis, unspecified without thyrotoxic crisis or storm: Secondary | ICD-10-CM | POA: Insufficient documentation

## 2010-08-03 DIAGNOSIS — H53149 Visual discomfort, unspecified: Secondary | ICD-10-CM | POA: Insufficient documentation

## 2010-08-03 DIAGNOSIS — G43909 Migraine, unspecified, not intractable, without status migrainosus: Secondary | ICD-10-CM | POA: Insufficient documentation

## 2010-08-03 DIAGNOSIS — Z79899 Other long term (current) drug therapy: Secondary | ICD-10-CM | POA: Insufficient documentation

## 2010-09-05 ENCOUNTER — Emergency Department (HOSPITAL_COMMUNITY)
Admission: EM | Admit: 2010-09-05 | Discharge: 2010-09-05 | Disposition: A | Discharge status: Home / Self Care | Payer: Medicare Other | Attending: Emergency Medicine | Admitting: Emergency Medicine

## 2010-09-05 DIAGNOSIS — E7800 Pure hypercholesterolemia, unspecified: Secondary | ICD-10-CM | POA: Insufficient documentation

## 2010-09-05 DIAGNOSIS — H53149 Visual discomfort, unspecified: Secondary | ICD-10-CM | POA: Insufficient documentation

## 2010-09-05 DIAGNOSIS — G43909 Migraine, unspecified, not intractable, without status migrainosus: Secondary | ICD-10-CM | POA: Insufficient documentation

## 2010-09-05 DIAGNOSIS — E039 Hypothyroidism, unspecified: Secondary | ICD-10-CM | POA: Insufficient documentation

## 2010-09-05 DIAGNOSIS — R110 Nausea: Secondary | ICD-10-CM | POA: Insufficient documentation

## 2010-10-08 LAB — CBC
HCT: 39.5
Hemoglobin: 13.5
MCV: 94.3
WBC: 16.2 — ABNORMAL HIGH

## 2010-10-08 LAB — DIFFERENTIAL
Eosinophils Relative: 1
Lymphocytes Relative: 8 — ABNORMAL LOW
Lymphs Abs: 1.3
Monocytes Absolute: 1

## 2010-10-08 LAB — I-STAT 8, (EC8 V) (CONVERTED LAB)
BUN: 7
Chloride: 101
Glucose, Bld: 122 — ABNORMAL HIGH
Hemoglobin: 15.6 — ABNORMAL HIGH
Potassium: 5
Sodium: 130 — ABNORMAL LOW
TCO2: 28

## 2010-10-14 LAB — URINALYSIS, ROUTINE W REFLEX MICROSCOPIC
Bilirubin Urine: NEGATIVE
Glucose, UA: NEGATIVE
Hgb urine dipstick: NEGATIVE
Ketones, ur: NEGATIVE
Nitrite: NEGATIVE
Protein, ur: NEGATIVE
Specific Gravity, Urine: 1.003 — ABNORMAL LOW
Urobilinogen, UA: 0.2
pH: 7

## 2010-10-14 LAB — CBC
HCT: 34.8 — ABNORMAL LOW
Hemoglobin: 12
MCHC: 34.4
MCV: 89.8
Platelets: 287
RBC: 3.88
RDW: 14.5
WBC: 10.6 — ABNORMAL HIGH

## 2010-10-14 LAB — URINE CULTURE: Colony Count: 9000

## 2010-10-14 LAB — COMPREHENSIVE METABOLIC PANEL
ALT: 36 — ABNORMAL HIGH
Alkaline Phosphatase: 110
Glucose, Bld: 80
Potassium: 3.7
Sodium: 141
Total Protein: 6.8

## 2010-10-14 LAB — LIPASE, BLOOD: Lipase: 20

## 2010-10-14 LAB — COMPREHENSIVE METABOLIC PANEL WITH GFR
AST: 22
Albumin: 3.5
BUN: 4 — ABNORMAL LOW
CO2: 26
Calcium: 9.1
Chloride: 107
Creatinine, Ser: 0.6
GFR calc Af Amer: >60
GFR calc non Af Amer: >60
Total Bilirubin: 0.4

## 2010-10-14 LAB — DIFFERENTIAL
Basophils Absolute: 0.1
Basophils Relative: 1
Eosinophils Absolute: 0.2
Eosinophils Relative: 2
Lymphocytes Relative: 42
Lymphs Abs: 4.5 — ABNORMAL HIGH
Monocytes Absolute: 0.4
Monocytes Relative: 4
Neutro Abs: 5.4
Neutrophils Relative %: 51

## 2010-10-15 LAB — BARBITURATE, URINE, CONFIRMATION
Amobarbital UR Quant: NEGATIVE
Butabarbital UR Quant: NEGATIVE
Phenobarbital GC/MS Conf: NEGATIVE

## 2010-10-15 LAB — RAPID URINE DRUG SCREEN, HOSP PERFORMED
Amphetamines: NOT DETECTED
Amphetamines: NOT DETECTED
Barbiturates: POSITIVE — AB
Barbiturates: POSITIVE — AB
Benzodiazepines: POSITIVE — AB
Benzodiazepines: POSITIVE — AB
Cocaine: NOT DETECTED
Cocaine: NOT DETECTED
Cocaine: NOT DETECTED
Cocaine: NOT DETECTED
Opiates: POSITIVE — AB
Opiates: NOT DETECTED
Tetrahydrocannabinol: NOT DETECTED
Tetrahydrocannabinol: NOT DETECTED
Tetrahydrocannabinol: NOT DETECTED
Tetrahydrocannabinol: NOT DETECTED

## 2010-10-15 LAB — CBC
HCT: 32.8 — ABNORMAL LOW
HCT: 33.3 — ABNORMAL LOW
HCT: 33.5 — ABNORMAL LOW
Hemoglobin: 11.8 — ABNORMAL LOW
Hemoglobin: 11.3 — ABNORMAL LOW
MCHC: 34.2
MCV: 89.7
MCV: 88.7
MCV: 89
Platelets: 301
RBC: 3.83 — ABNORMAL LOW
RBC: 3.7 — ABNORMAL LOW
RBC: 3.78 — ABNORMAL LOW
RDW: 15
RDW: 15.2
WBC: 12.2 — ABNORMAL HIGH
WBC: 8.9

## 2010-10-15 LAB — DIFFERENTIAL
Basophils Absolute: 0.1
Basophils Absolute: 0.1
Basophils Relative: 1
Basophils Relative: 1
Eosinophils Absolute: 0.2
Eosinophils Absolute: 0.2
Eosinophils Relative: 1
Eosinophils Relative: 2
Lymphocytes Relative: 22
Lymphocytes Relative: 34
Lymphs Abs: 3.4
Lymphs Abs: 2.7
Monocytes Absolute: 0.6
Monocytes Absolute: 0.6
Monocytes Relative: 7
Monocytes Relative: 5
Neutro Abs: 8.8 — ABNORMAL HIGH

## 2010-10-15 LAB — POCT I-STAT, CHEM 8
BUN: 4 — ABNORMAL LOW
Calcium, Ion: 1.06 — ABNORMAL LOW
Calcium, Ion: 1.18
Chloride: 108
Creatinine, Ser: 0.7
Creatinine, Ser: 1.1
Glucose, Bld: 107 — ABNORMAL HIGH
Glucose, Bld: 110 — ABNORMAL HIGH
HCT: 35 — ABNORMAL LOW
HCT: 35 — ABNORMAL LOW
Hemoglobin: 11.9 — ABNORMAL LOW
Sodium: 137
Sodium: 138
TCO2: 21

## 2010-10-15 LAB — COMPREHENSIVE METABOLIC PANEL
AST: 14
AST: 21
Albumin: 3 — ABNORMAL LOW
Albumin: 3.4 — ABNORMAL LOW
Alkaline Phosphatase: 72
CO2: 23
Calcium: 8.8
Chloride: 104
Creatinine, Ser: 0.61
Creatinine, Ser: 0.78
GFR calc Af Amer: >60
GFR calc Af Amer: >60
GFR calc non Af Amer: >60
GFR calc non Af Amer: >60
Potassium: 3.8
Total Bilirubin: 0.7

## 2010-10-15 LAB — ETHANOL
Alcohol, Ethyl (B): <5
Alcohol, Ethyl (B): <5
Alcohol, Ethyl (B): <5

## 2010-10-15 LAB — BASIC METABOLIC PANEL
BUN: 5 — ABNORMAL LOW
Calcium: 9.2
GFR calc Af Amer: >60
GFR calc non Af Amer: >60
GFR calc non Af Amer: >60
Glucose, Bld: 77
Glucose, Bld: 87
Potassium: 3.9
Potassium: 4.2
Sodium: 138

## 2010-10-15 LAB — DRUGS OF ABUSE SCREEN W/O ALC, ROUTINE URINE
Barbiturate Quant, Ur: POSITIVE — AB
Benzodiazepines.: NEGATIVE
Cocaine Metabolites: NEGATIVE
Creatinine,U: 45.7
Methadone: NEGATIVE
Opiate Screen, Urine: NEGATIVE
Propoxyphene: NEGATIVE

## 2010-10-15 LAB — CARDIAC PANEL(CRET KIN+CKTOT+MB+TROPI): Total CK: 595 — ABNORMAL HIGH

## 2010-10-15 LAB — TRICYCLICS SCREEN, URINE: TCA Scrn: NOT DETECTED

## 2010-10-16 LAB — POCT I-STAT, CHEM 8
Calcium, Ion: 1.18
Chloride: 105
Creatinine, Ser: 1
Glucose, Bld: 80
Potassium: 3.7

## 2010-10-16 LAB — URINALYSIS, ROUTINE W REFLEX MICROSCOPIC
Glucose, UA: NEGATIVE
Hgb urine dipstick: NEGATIVE
pH: 5.5

## 2010-10-16 LAB — COMPREHENSIVE METABOLIC PANEL
ALT: 17
Alkaline Phosphatase: 70
CO2: 25
Chloride: 104
GFR calc non Af Amer: >60
Glucose, Bld: 87
Potassium: 3.8
Sodium: 133 — ABNORMAL LOW
Total Bilirubin: 0.3
Total Protein: 5.9 — ABNORMAL LOW

## 2010-10-16 LAB — DIFFERENTIAL
Basophils Absolute: 0.1
Basophils Relative: 1
Eosinophils Absolute: 0.1
Eosinophils Relative: 2
Monocytes Absolute: 0.7
Monocytes Relative: 8
Neutro Abs: 4.1

## 2010-10-16 LAB — TRICYCLICS SCREEN, URINE: TCA Scrn: POSITIVE — AB

## 2010-10-16 LAB — BASIC METABOLIC PANEL
CO2: 27
Calcium: 9.5
Chloride: 100
GFR calc Af Amer: >60
Glucose, Bld: 82
Sodium: 137

## 2010-10-16 LAB — CBC
Hemoglobin: 10.9 — ABNORMAL LOW
Hemoglobin: 12
MCHC: 33.9
MCV: 89.4
RBC: 3.55 — ABNORMAL LOW
RBC: 3.96
RDW: 16.4 — ABNORMAL HIGH
WBC: 10

## 2010-10-16 LAB — RAPID URINE DRUG SCREEN, HOSP PERFORMED: Benzodiazepines: POSITIVE — AB

## 2010-10-16 LAB — URINE MICROSCOPIC-ADD ON

## 2010-10-16 LAB — LIPASE, BLOOD: Lipase: 16

## 2010-10-19 LAB — RAPID URINE DRUG SCREEN, HOSP PERFORMED
Benzodiazepines: POSITIVE — AB
Cocaine: NOT DETECTED
Cocaine: NOT DETECTED
Opiates: NOT DETECTED
Opiates: NOT DETECTED
Tetrahydrocannabinol: NOT DETECTED
Tetrahydrocannabinol: NOT DETECTED

## 2010-10-19 LAB — COMPREHENSIVE METABOLIC PANEL
ALT: 17
AST: 21
Albumin: 3.1 — ABNORMAL LOW
Calcium: 9.2
GFR calc Af Amer: >60
Sodium: 137
Total Protein: 6.2

## 2010-10-19 LAB — DIFFERENTIAL
Basophils Absolute: 0.2 — ABNORMAL HIGH
Basophils Relative: 2 — ABNORMAL HIGH
Eosinophils Absolute: 0.2
Monocytes Relative: 5
Neutro Abs: 3.5
Neutrophils Relative %: 44

## 2010-10-19 LAB — PREGNANCY, URINE: Preg Test, Ur: NEGATIVE

## 2010-10-19 LAB — BASIC METABOLIC PANEL
BUN: 1 — ABNORMAL LOW
CO2: 27
Calcium: 9.2
Chloride: 103
Creatinine, Ser: 0.65
GFR calc Af Amer: >60
Glucose, Bld: 84

## 2010-10-19 LAB — CBC
MCHC: 33.5
MCV: 90.9
Platelets: 247
RBC: 4.41
RDW: 15.5

## 2010-10-23 LAB — COMPREHENSIVE METABOLIC PANEL
ALT: 21 U/L (ref 0–35)
AST: 20 U/L (ref 0–37)
Albumin: 3.6 g/dL (ref 3.5–5.2)
Alkaline Phosphatase: 72 U/L (ref 39–117)
BUN: 4 mg/dL — ABNORMAL LOW (ref 6–23)
CO2: 21 mEq/L (ref 19–32)
Calcium: 8.5 mg/dL (ref 8.4–10.5)
Chloride: 104 mEq/L (ref 96–112)
Creatinine, Ser: 0.65 mg/dL (ref 0.4–1.2)
GFR calc Af Amer: >60 mL/min (ref >60)
GFR calc non Af Amer: >60 mL/min (ref >60)
Glucose, Bld: 81 mg/dL (ref 70–99)
Potassium: 4.1 mEq/L (ref 3.5–5.1)
Sodium: 135 mEq/L (ref 135–145)
Total Bilirubin: 0.7 mg/dL (ref 0.3–1.2)
Total Protein: 7.1 g/dL (ref 6.0–8.3)

## 2010-10-23 LAB — RAPID URINE DRUG SCREEN, HOSP PERFORMED
Amphetamines: NOT DETECTED
Barbiturates: NOT DETECTED
Benzodiazepines: NOT DETECTED
Cocaine: NOT DETECTED
Opiates: NOT DETECTED
Tetrahydrocannabinol: NOT DETECTED

## 2010-10-23 LAB — ETHANOL: Alcohol, Ethyl (B): <5 mg/dL (ref 0–10)

## 2010-10-27 LAB — RAPID URINE DRUG SCREEN, HOSP PERFORMED
Amphetamines: NOT DETECTED
Amphetamines: NOT DETECTED
Barbiturates: NOT DETECTED
Barbiturates: NOT DETECTED
Barbiturates: NOT DETECTED
Benzodiazepines: POSITIVE — AB
Cocaine: NOT DETECTED
Cocaine: NOT DETECTED
Opiates: NOT DETECTED
Opiates: POSITIVE — AB
Opiates: POSITIVE — AB
Opiates: POSITIVE — AB
Tetrahydrocannabinol: NOT DETECTED
Tetrahydrocannabinol: NOT DETECTED

## 2010-10-27 LAB — BENZODIAZEPINE, QUANTITATIVE, URINE: Alprazolam (GC/LC/MS), ur confirm: 1500 ng/mL

## 2010-10-27 LAB — I-STAT 8, (EC8 V) (CONVERTED LAB)
Acid-base deficit: 3 — ABNORMAL HIGH
BUN: 7
Bicarbonate: 21.1
Chloride: 109
Potassium: 3.7
TCO2: 22
pCO2, Ven: 31.6 — ABNORMAL LOW
pH, Ven: 7.413 — ABNORMAL HIGH
pH, Ven: 7.423 — ABNORMAL HIGH

## 2010-10-27 LAB — BASIC METABOLIC PANEL
BUN: 4 — ABNORMAL LOW
BUN: 4 — ABNORMAL LOW
CO2: 28
CO2: 23
Chloride: 103
Chloride: 111
GFR calc Af Amer: >60
GFR calc Af Amer: >60
GFR calc non Af Amer: >60
Potassium: 3.3 — ABNORMAL LOW
Potassium: 4.1
Sodium: 135

## 2010-10-27 LAB — DIFFERENTIAL
Eosinophils Absolute: 0.1
Eosinophils Relative: 1
Eosinophils Relative: 1
Eosinophils Relative: 2
Lymphocytes Relative: 27
Lymphocytes Relative: 31
Lymphocytes Relative: 38
Lymphs Abs: 3.2
Lymphs Abs: 3.4 — ABNORMAL HIGH
Lymphs Abs: 4.9 — ABNORMAL HIGH
Monocytes Absolute: 0.8
Monocytes Relative: 8
Neutrophils Relative %: 54

## 2010-10-27 LAB — URINALYSIS, ROUTINE W REFLEX MICROSCOPIC
Bilirubin Urine: NEGATIVE
Glucose, UA: NEGATIVE
Hgb urine dipstick: NEGATIVE
Ketones, ur: NEGATIVE
Protein, ur: 100 — AB
Protein, ur: NEGATIVE
Specific Gravity, Urine: 1.002 — ABNORMAL LOW
Urobilinogen, UA: 0.2
pH: 6.5

## 2010-10-27 LAB — URINE DRUGS OF ABUSE SCREEN W ALC, ROUTINE (REF LAB)
Amphetamine Screen, Ur: NEGATIVE
Benzodiazepines.: POSITIVE — AB
Cocaine Metabolites: NEGATIVE
Opiate Screen, Urine: POSITIVE — AB
Phencyclidine (PCP): NEGATIVE
Propoxyphene: NEGATIVE

## 2010-10-27 LAB — CBC
HCT: 39.7
HCT: 41
HCT: 41.6
Hemoglobin: 14.1
Hemoglobin: 14.2
MCV: 95.4
Platelets: 257
Platelets: 263
RBC: 4.15
RBC: 4.29
WBC: 10.9 — ABNORMAL HIGH
WBC: 11.8 — ABNORMAL HIGH
WBC: 13 — ABNORMAL HIGH

## 2010-10-27 LAB — OPIATE, QUANTITATIVE, URINE
Codeine Urine: 1300 ng/mL
Hydrocodone: NEGATIVE ng/mL
Hydromorphone GC/MS Conf: NEGATIVE ng/mL
Oxymorphone: NEGATIVE ng/mL

## 2010-10-27 LAB — ETHANOL
Alcohol, Ethyl (B): <5
Alcohol, Ethyl (B): <5
Alcohol, Ethyl (B): <5

## 2010-10-27 LAB — PREGNANCY, URINE: Preg Test, Ur: NEGATIVE

## 2010-10-27 LAB — BARBITURATE, URINE, CONFIRMATION
Amobarbital UR Quant: NEGATIVE
Phenobarbital GC/MS Conf: NEGATIVE

## 2010-10-27 LAB — POCT I-STAT CREATININE
Creatinine, Ser: 0.8
Operator id: 161631

## 2010-10-28 LAB — DIFFERENTIAL
Basophils Absolute: 0.3 — ABNORMAL HIGH
Basophils Relative: 2 — ABNORMAL HIGH
Eosinophils Absolute: 0.1
Eosinophils Absolute: 0.1
Eosinophils Relative: 1
Eosinophils Relative: 1
Lymphs Abs: 4.5 — ABNORMAL HIGH
Metamyelocytes Relative: 0
Monocytes Relative: 5
Myelocytes: 0
nRBC: 0

## 2010-10-28 LAB — ETHANOL: Alcohol, Ethyl (B): <5

## 2010-10-28 LAB — URINALYSIS, ROUTINE W REFLEX MICROSCOPIC
Nitrite: NEGATIVE
Protein, ur: NEGATIVE
Urobilinogen, UA: 0.2

## 2010-10-28 LAB — BASIC METABOLIC PANEL
BUN: 4 — ABNORMAL LOW
CO2: 26
Chloride: 102
Glucose, Bld: 91
Potassium: 3.7

## 2010-10-28 LAB — CBC
HCT: 37.9
HCT: 41.4
MCHC: 33.8
MCV: 96.3
MCV: 95
Platelets: 378
RBC: 3.94
WBC: 12.3 — ABNORMAL HIGH
WBC: 13.5 — ABNORMAL HIGH

## 2010-10-28 LAB — RAPID URINE DRUG SCREEN, HOSP PERFORMED
Amphetamines: NOT DETECTED
Tetrahydrocannabinol: NOT DETECTED

## 2010-10-28 LAB — URINE MICROSCOPIC-ADD ON

## 2010-10-28 LAB — PREGNANCY, URINE: Preg Test, Ur: NEGATIVE

## 2010-10-30 LAB — COMPREHENSIVE METABOLIC PANEL
ALT: 17
Albumin: 4.2
Alkaline Phosphatase: 73
GFR calc Af Amer: >60
Potassium: 3.8
Sodium: 139
Total Protein: 7.7

## 2010-10-30 LAB — URINALYSIS, ROUTINE W REFLEX MICROSCOPIC
Glucose, UA: NEGATIVE
Ketones, ur: NEGATIVE
Protein, ur: NEGATIVE

## 2010-10-30 LAB — DIFFERENTIAL
Basophils Relative: 1
Eosinophils Absolute: 0.1
Monocytes Absolute: 0.6
Monocytes Relative: 6

## 2010-10-30 LAB — URINE MICROSCOPIC-ADD ON

## 2010-10-30 LAB — CBC
Hemoglobin: 14.4
Platelets: 264
RDW: 14.3 — ABNORMAL HIGH

## 2010-10-30 LAB — POCT PREGNANCY, URINE
Operator id: 27011
Preg Test, Ur: NEGATIVE

## 2010-11-13 IMAGING — CT CT HEAD W/O CM
1 series · 16 of 30 positions shown, 20 images · non-contrast
Comparison: 07/16/2008 and earlier.

CLINICAL DATA: 42-year-old female with altered level of
consciousness.

CT HEAD WITHOUT CONTRAST
TECHNIQUE: Contiguous axial images were obtained from the base of
the skull through the vertex without contrast.

[Series 2: head routine 4.8 h37s · axial · 0.43mm/px · z∈[-105,+31]mm · 16 of 30 slices shown, 20 images]
[im 2/30  brain]
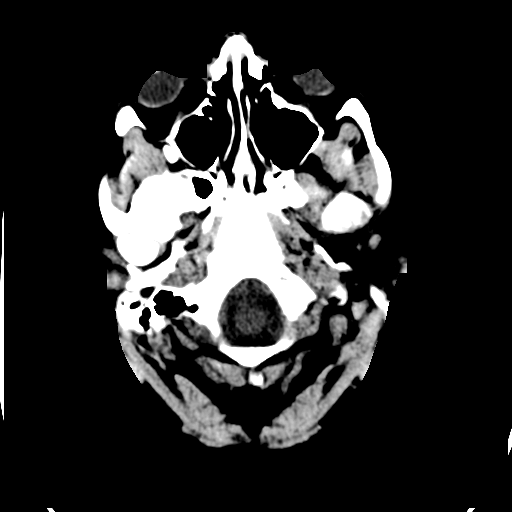
[im 2/30  bone]
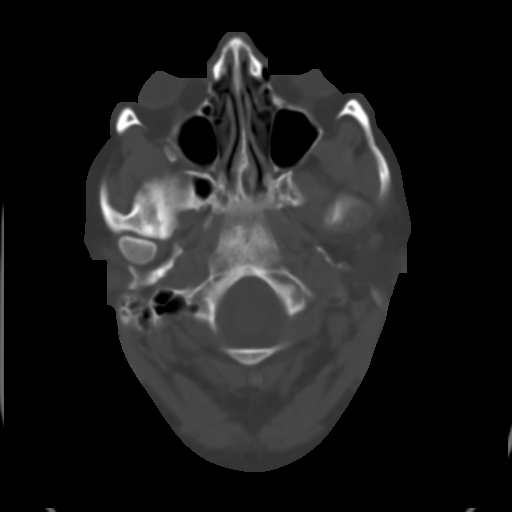
[im 4/30  brain]
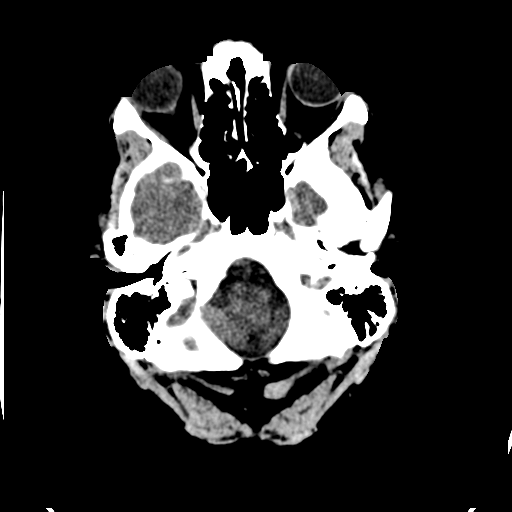
[im 6/30  brain]
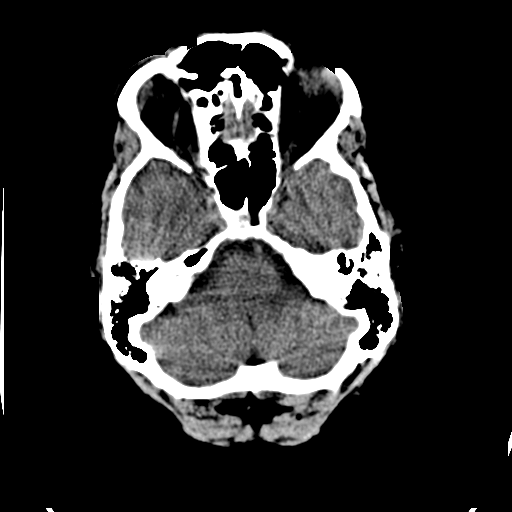
[im 8/30  brain]
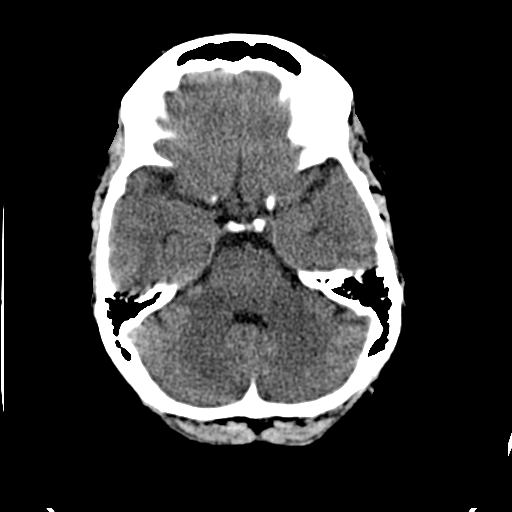
[im 9/30  brain]
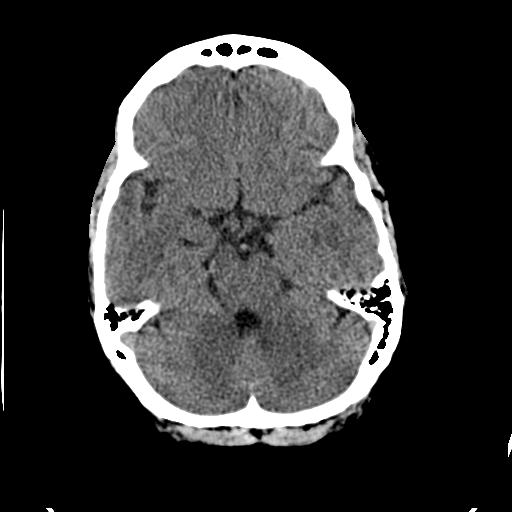
[im 9/30  bone]
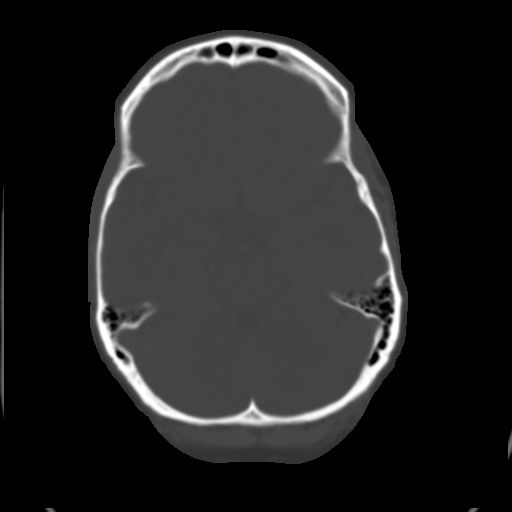
[im 11/30  brain]
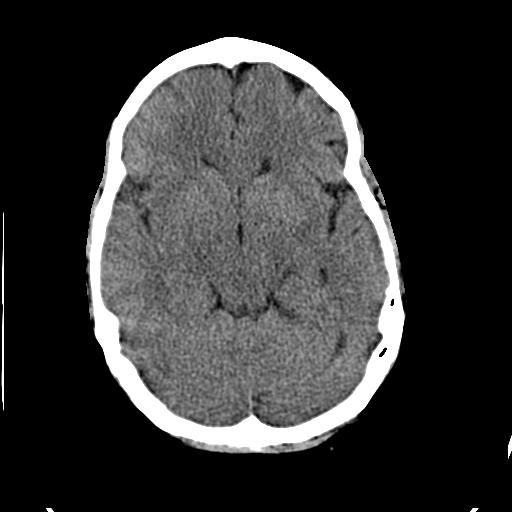
[im 13/30  brain]
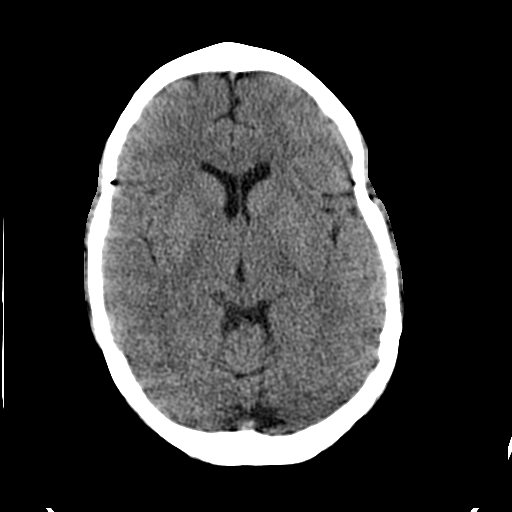
[im 15/30  brain]
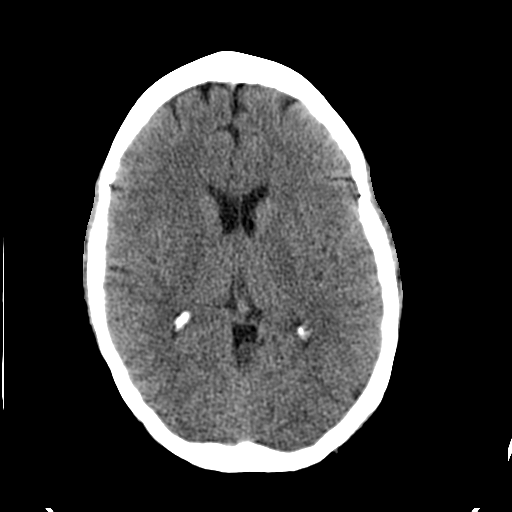
[im 16/30  brain]
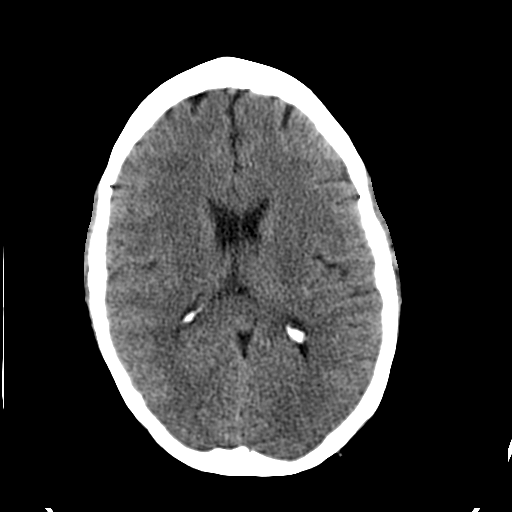
[im 16/30  bone]
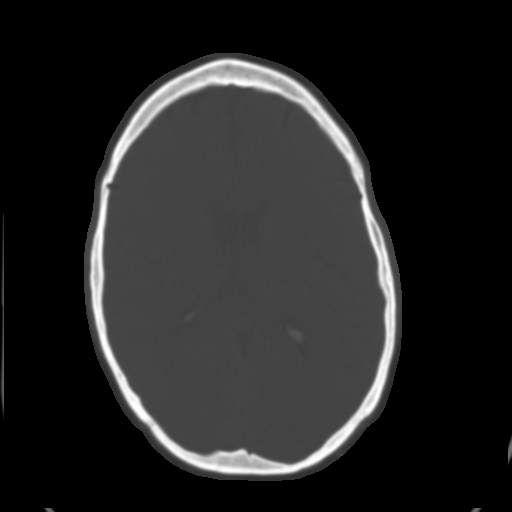
[im 18/30  brain]
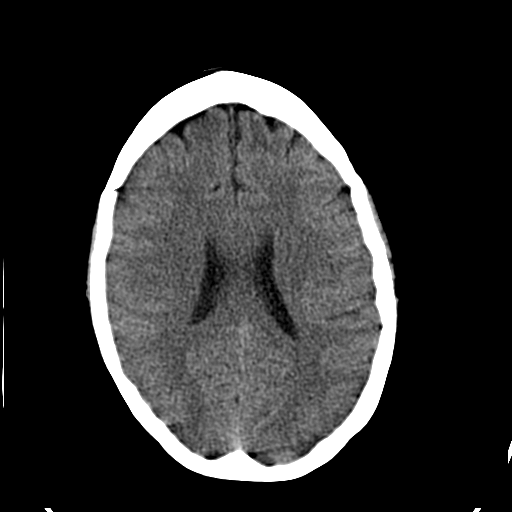
[im 20/30  brain]
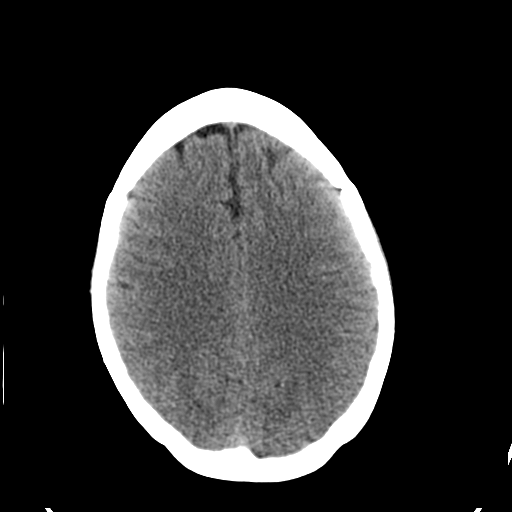
[im 22/30  brain]
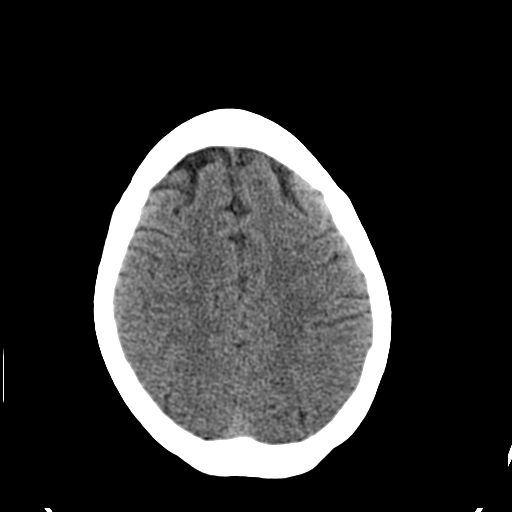
[im 23/30  brain]
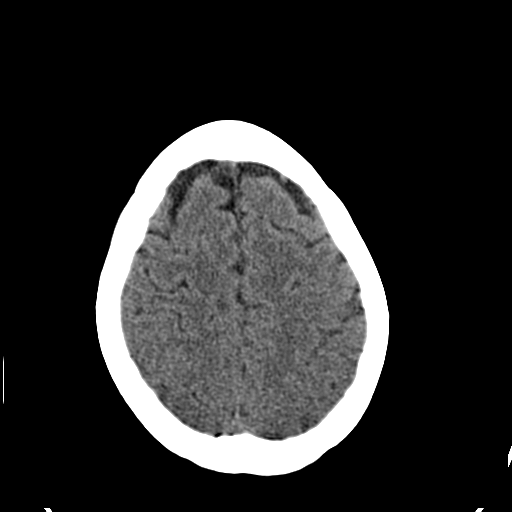
[im 23/30  bone]
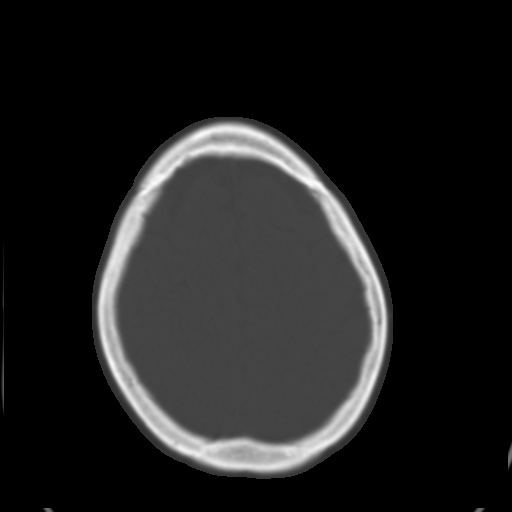
[im 25/30  brain]
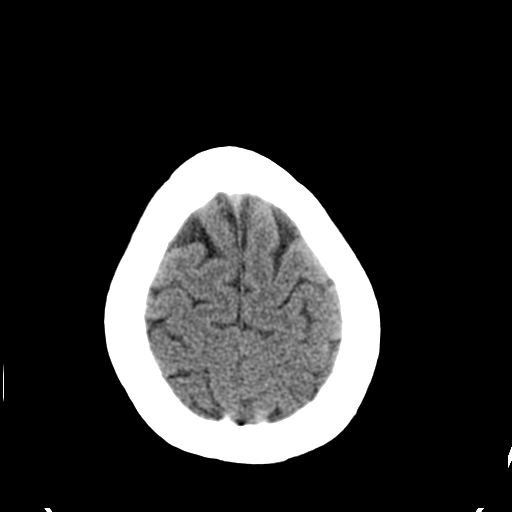
[im 27/30  brain]
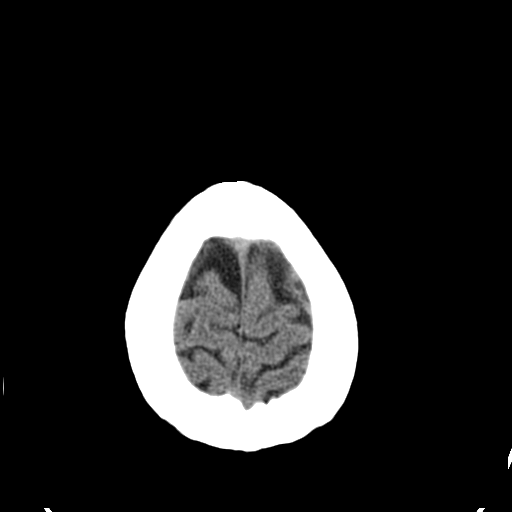
[im 29/30  brain]
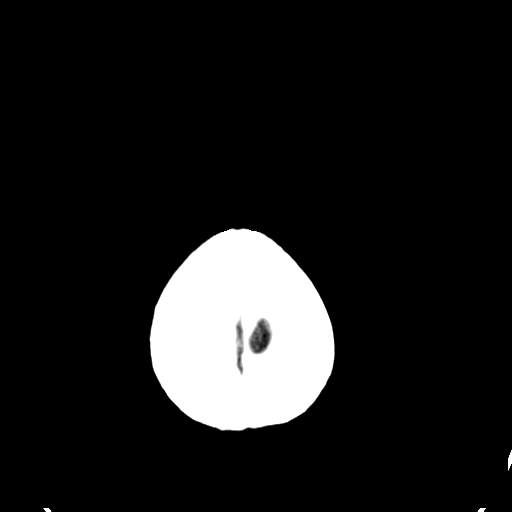

[16 of 30 positions shown; findings below may reference images not displayed]

FINDINGS: Postoperative changes to the mandible on the scout view.
Visualized orbits and scalp soft tissues are within normal limits.
Visualized paranasal sinuses and mastoids are clear.  No acute
osseous abnormality identified.

Cerebral volume is within normal limits for age.  No midline shift,
ventriculomegaly, mass effect, evidence of mass lesion,
intracranial hemorrhage or evidence of cortically based acute
infarction.  Gray-white matter differentiation is within normal
limits throughout the brain.  No suspicious intracranial vascular
hyperdensity.
IMPRESSION: Stable, normal noncontrast appearance of the brain.

## 2010-11-17 ENCOUNTER — Emergency Department (HOSPITAL_COMMUNITY)
Admission: EM | Admit: 2010-11-17 | Discharge: 2010-11-17 | Disposition: A | Discharge status: Home / Self Care | Payer: Medicare Other | Attending: Emergency Medicine | Admitting: Emergency Medicine

## 2010-11-17 DIAGNOSIS — E0590 Thyrotoxicosis, unspecified without thyrotoxic crisis or storm: Secondary | ICD-10-CM | POA: Insufficient documentation

## 2010-11-17 DIAGNOSIS — R51 Headache: Secondary | ICD-10-CM | POA: Insufficient documentation

## 2010-11-17 DIAGNOSIS — E7800 Pure hypercholesterolemia, unspecified: Secondary | ICD-10-CM | POA: Insufficient documentation

## 2010-11-17 DIAGNOSIS — H53149 Visual discomfort, unspecified: Secondary | ICD-10-CM | POA: Insufficient documentation

## 2010-11-17 DIAGNOSIS — Z79899 Other long term (current) drug therapy: Secondary | ICD-10-CM | POA: Insufficient documentation

## 2010-11-17 DIAGNOSIS — F341 Dysthymic disorder: Secondary | ICD-10-CM | POA: Insufficient documentation

## 2010-12-20 ENCOUNTER — Encounter: Payer: Self-pay | Admitting: Emergency Medicine

## 2010-12-20 ENCOUNTER — Emergency Department (HOSPITAL_COMMUNITY)
Admission: EM | Admit: 2010-12-20 | Discharge: 2010-12-20 | Discharge status: Against Medical Advice | Payer: Medicare Other | Attending: Emergency Medicine | Admitting: Emergency Medicine

## 2010-12-20 DIAGNOSIS — R51 Headache: Secondary | ICD-10-CM

## 2010-12-20 DIAGNOSIS — F341 Dysthymic disorder: Secondary | ICD-10-CM | POA: Insufficient documentation

## 2010-12-20 DIAGNOSIS — E039 Hypothyroidism, unspecified: Secondary | ICD-10-CM | POA: Insufficient documentation

## 2010-12-20 DIAGNOSIS — E7800 Pure hypercholesterolemia, unspecified: Secondary | ICD-10-CM | POA: Insufficient documentation

## 2010-12-20 DIAGNOSIS — Z79899 Other long term (current) drug therapy: Secondary | ICD-10-CM | POA: Insufficient documentation

## 2010-12-20 HISTORY — DX: Post-traumatic stress disorder, unspecified: F43.10

## 2010-12-20 HISTORY — DX: Personality disorder, unspecified: F60.9

## 2010-12-20 HISTORY — DX: Anxiety disorder, unspecified: F41.9

## 2010-12-20 HISTORY — DX: Major depressive disorder, single episode, unspecified: F32.9

## 2010-12-20 HISTORY — DX: Pure hypercholesterolemia, unspecified: E78.00

## 2010-12-20 HISTORY — DX: Hypothyroidism, unspecified: E03.9

## 2010-12-20 HISTORY — DX: Depression, unspecified: F32.A

## 2010-12-20 NOTE — ED Notes (Signed)
Dr Dierdre Highman to bedside states he will not give pt narcotics. Pt leaves without signing. Pt was mad.

## 2010-12-20 NOTE — ED Notes (Signed)
C/o headache, nausea, and vomiting since yesterday.  Also reports problems with flashbacks from PTSD and anxiety.  Denies suicidal ideation.

## 2010-12-20 NOTE — ED Provider Notes (Signed)
History     CSN: [id number] Arrival date & time: 12/20/2010  8:46 PM   First MD Initiated Contact with Patient 12/20/10 2253      Chief Complaint  Patient presents with  . Migraine    (Consider location/radiation/quality/duration/timing/severity/associated sxs/prior treatment) Patient is a 45 y.o. female presenting with migraine. The history is provided by the patient.  Migraine This is a recurrent problem. The current episode started 2 days ago. The problem occurs constantly. The problem has been gradually worsening. Associated symptoms include headaches. Pertinent negatives include no chest pain, no abdominal pain and no shortness of breath. Exacerbated by: light and noise. The symptoms are relieved by nothing. Treatments tried: home narcotics. The treatment provided no relief.  states she is prescribed oxycontin 30mg  BID for chronic HAs and took her prescribed dose this am and has had persistent HA despite that.  She is here requesting IV narcotics for recurrent migraines.  She has PTSD and anxiety and declines any other medications despite narcotics.  When offered declines caffeine, droperidol, HA cocktails. No SI/ HI.   Past Medical History  Diagnosis Date  . Migraine   . PTSD (post-traumatic stress disorder)   . Anxiety   . Personality disorder   . Depression   . High cholesterol   . Hypothyroid     Past Surgical History  Procedure Date  . Cholecystectomy   . Mandible fracture surgery     No family history on file.  History  Substance Use Topics  . Smoking status: Current Everyday Smoker  . Smokeless tobacco: Not on file  . Alcohol Use: No    OB History    Grav Para Term Preterm Abortions TAB SAB Ect Mult Living                  Review of Systems  Constitutional: Negative for fever and chills.  HENT: Negative for neck pain and neck stiffness.   Eyes: Negative for pain.  Respiratory: Negative for shortness of breath.   Cardiovascular: Negative for chest  pain.  Gastrointestinal: Negative for abdominal pain.  Genitourinary: Negative for dysuria.  Musculoskeletal: Negative for back pain.  Skin: Negative for rash.  Neurological: Positive for headaches. Negative for dizziness, tremors, seizures, syncope, facial asymmetry, speech difficulty, weakness, light-headedness and numbness.  Psychiatric/Behavioral: Negative for hallucinations.  All other systems reviewed and are negative.    Allergies  Compazine; Geodon; Haldol; Imitrex; Penicillins; Reglan; Wellbutrin; and Zofran  Home Medications   Current Outpatient Rx  Name Route Sig Dispense Refill  . DESVENLAFAXINE SUCCINATE ER 100 MG PO TB24 Oral Take 100 mg by mouth daily.      Marland Kitchen DIAZEPAM 10 MG PO TABS Oral Take 10 mg by mouth every 6 (six) hours as needed.      Marland Kitchen EZETIMIBE-SIMVASTATIN 10-80 MG PO TABS Oral Take 1 tablet by mouth at bedtime.      Marland Kitchen LEVOTHYROXINE SODIUM 112 MCG PO TABS Oral Take 112 mcg by mouth daily.      . OXYCODONE HCL ER 30 MG PO TB12 Oral Take 30 mg by mouth every 12 (twelve) hours.      . TRAZODONE HCL 300 MG PO TABS Oral Take 300 mg by mouth at bedtime.      Marland Kitchen VITAMIN D (ERGOCALCIFEROL) 50000 UNITS PO CAPS Oral Take 50,000 Units by mouth every 7 (seven) days. Take on fridays     . PROMETHAZINE HCL 25 MG RE SUPP Rectal Place 25 mg rectally every 6 (six) hours as  needed.        BP 109/68  Pulse 107  Temp(Src) 98.2 F (36.8 C) (Oral)  Resp 24  SpO2 100%  LMP 12/04/2010  Physical Exam  Constitutional: She is oriented to person, place, and time. She appears well-developed and well-nourished.  HENT:  Head: Normocephalic and atraumatic.  Eyes: Conjunctivae and EOM are normal. Pupils are equal, round, and reactive to light.  Neck: Full passive range of motion without pain. Neck supple. No thyromegaly present.       No meningismus  Cardiovascular: Normal rate, regular rhythm, S1 normal, S2 normal and intact distal pulses.   Pulmonary/Chest: Effort normal and  breath sounds normal.  Abdominal: Soft. Bowel sounds are normal. There is no tenderness. There is no CVA tenderness.  Musculoskeletal: Normal range of motion.  Neurological: She is alert and oriented to person, place, and time. She has normal strength and normal reflexes. No cranial nerve deficit or sensory deficit. She displays a negative Romberg sign. GCS eye subscore is 4. GCS verbal subscore is 5. GCS motor subscore is 6.       Normal Gait  Skin: Skin is warm and dry. No rash noted. No cyanosis. Nails show no clubbing.  Psychiatric: She has a normal mood and affect. Her speech is normal and behavior is normal.    ED Course  Procedures (including critical care time)  Chronic recurrent headache   MDM   I had a long discussion with patient regarding her HAs and my decision not to treat with more narcotics.  She refuses any other medications, even ones she that has never tried in the past.  Despite multiple treatment options and multiple conversations, PT left AMA. A/O x 4, no indication for admit or imaging given normal neuro exam.         [name] Nielsen, MD 12/20/10 2334
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
## 2013-06-14 ENCOUNTER — Emergency Department (HOSPITAL_COMMUNITY)
Admission: EM | Admit: 2013-06-14 | Discharge: 2013-06-15 | Disposition: A | Discharge status: Home / Self Care | Payer: Medicare Other | Attending: Emergency Medicine | Admitting: Emergency Medicine

## 2013-06-14 ENCOUNTER — Encounter (HOSPITAL_COMMUNITY): Payer: Self-pay | Admitting: Emergency Medicine

## 2013-06-14 DIAGNOSIS — F609 Personality disorder, unspecified: Secondary | ICD-10-CM | POA: Insufficient documentation

## 2013-06-14 DIAGNOSIS — R45850 Homicidal ideations: Secondary | ICD-10-CM | POA: Insufficient documentation

## 2013-06-14 DIAGNOSIS — F17200 Nicotine dependence, unspecified, uncomplicated: Secondary | ICD-10-CM | POA: Insufficient documentation

## 2013-06-14 DIAGNOSIS — R443 Hallucinations, unspecified: Secondary | ICD-10-CM | POA: Insufficient documentation

## 2013-06-14 DIAGNOSIS — F909 Attention-deficit hyperactivity disorder, unspecified type: Secondary | ICD-10-CM | POA: Insufficient documentation

## 2013-06-14 DIAGNOSIS — R519 Headache, unspecified: Secondary | ICD-10-CM

## 2013-06-14 DIAGNOSIS — F411 Generalized anxiety disorder: Secondary | ICD-10-CM | POA: Insufficient documentation

## 2013-06-14 DIAGNOSIS — Z880 Allergy status to penicillin: Secondary | ICD-10-CM | POA: Insufficient documentation

## 2013-06-14 DIAGNOSIS — E039 Hypothyroidism, unspecified: Secondary | ICD-10-CM | POA: Insufficient documentation

## 2013-06-14 DIAGNOSIS — F4310 Post-traumatic stress disorder, unspecified: Secondary | ICD-10-CM | POA: Insufficient documentation

## 2013-06-14 DIAGNOSIS — F329 Major depressive disorder, single episode, unspecified: Secondary | ICD-10-CM | POA: Diagnosis present

## 2013-06-14 DIAGNOSIS — R51 Headache: Secondary | ICD-10-CM

## 2013-06-14 DIAGNOSIS — Z79899 Other long term (current) drug therapy: Secondary | ICD-10-CM | POA: Insufficient documentation

## 2013-06-14 DIAGNOSIS — F3289 Other specified depressive episodes: Secondary | ICD-10-CM | POA: Insufficient documentation

## 2013-06-14 DIAGNOSIS — R45851 Suicidal ideations: Secondary | ICD-10-CM | POA: Insufficient documentation

## 2013-06-14 DIAGNOSIS — G43909 Migraine, unspecified, not intractable, without status migrainosus: Secondary | ICD-10-CM | POA: Insufficient documentation

## 2013-06-14 DIAGNOSIS — E7800 Pure hypercholesterolemia, unspecified: Secondary | ICD-10-CM | POA: Insufficient documentation

## 2013-06-14 HISTORY — DX: Attention-deficit hyperactivity disorder, unspecified type: F90.9

## 2013-06-14 LAB — ETHANOL: Alcohol, Ethyl (B): <11 mg/dL (ref 0–11)

## 2013-06-14 LAB — COMPREHENSIVE METABOLIC PANEL
ALBUMIN: 3.7 g/dL (ref 3.5–5.2)
ALT: 27 U/L (ref 0–35)
AST: 35 U/L (ref 0–37)
Alkaline Phosphatase: 76 U/L (ref 39–117)
BUN: 9 mg/dL (ref 6–23)
CO2: 25 mEq/L (ref 19–32)
Calcium: 10.2 mg/dL (ref 8.4–10.5)
Chloride: 102 mEq/L (ref 96–112)
Creatinine, Ser: 0.82 mg/dL (ref 0.50–1.10)
GFR calc non Af Amer: 83 mL/min — ABNORMAL LOW (ref >90)
GLUCOSE: 84 mg/dL (ref 70–99)
Potassium: 4.5 mEq/L (ref 3.7–5.3)
SODIUM: 140 meq/L (ref 137–147)
TOTAL PROTEIN: 7.2 g/dL (ref 6.0–8.3)
Total Bilirubin: <0.2 mg/dL — ABNORMAL LOW (ref 0.3–1.2)

## 2013-06-14 LAB — RAPID URINE DRUG SCREEN, HOSP PERFORMED
Amphetamines: NOT DETECTED
BARBITURATES: NOT DETECTED
BENZODIAZEPINES: POSITIVE — AB
COCAINE: NOT DETECTED
Opiates: NOT DETECTED
Tetrahydrocannabinol: NOT DETECTED

## 2013-06-14 LAB — CBC
HCT: 39.3 % (ref 36.0–46.0)
Hemoglobin: 13.2 g/dL (ref 12.0–15.0)
MCH: 32.4 pg (ref 26.0–34.0)
MCHC: 33.6 g/dL (ref 30.0–36.0)
MCV: 96.3 fL (ref 78.0–100.0)
PLATELETS: 222 10*3/uL (ref 150–400)
RBC: 4.08 MIL/uL (ref 3.87–5.11)
RDW: 13.5 % (ref 11.5–15.5)
WBC: 12.8 10*3/uL — ABNORMAL HIGH (ref 4.0–10.5)

## 2013-06-14 LAB — LITHIUM LEVEL: Lithium Lvl: 0.6 mEq/L — ABNORMAL LOW (ref 0.80–1.40)

## 2013-06-14 MED ORDER — SODIUM CHLORIDE 0.9 % IV BOLUS (SEPSIS)
500.0000 mL | Freq: Once | INTRAVENOUS | Status: AC
  Administered 2013-06-14: 500 mL via INTRAVENOUS

## 2013-06-14 MED ORDER — VITAMIN D (ERGOCALCIFEROL) 1.25 MG (50000 UNIT) PO CAPS
50000.0000 [IU] | ORAL_CAPSULE | ORAL | Status: DC
  Administered 2013-06-15: 50000 [IU] via ORAL
  Filled 2013-06-14: qty 1

## 2013-06-14 MED ORDER — EZETIMIBE-SIMVASTATIN 10-80 MG PO TABS
1.0000 | ORAL_TABLET | Freq: Every day | ORAL | Status: DC
  Administered 2013-06-14: 1 via ORAL
  Filled 2013-06-14 (×3): qty 1

## 2013-06-14 MED ORDER — LEVOTHYROXINE SODIUM 112 MCG PO TABS
112.0000 ug | ORAL_TABLET | Freq: Every day | ORAL | Status: DC
  Administered 2013-06-15: 112 ug via ORAL
  Filled 2013-06-14 (×2): qty 1

## 2013-06-14 MED ORDER — TRAZODONE HCL 100 MG PO TABS
300.0000 mg | ORAL_TABLET | Freq: Every day | ORAL | Status: DC
  Administered 2013-06-14: 300 mg via ORAL
  Filled 2013-06-14: qty 3

## 2013-06-14 MED ORDER — HALOPERIDOL 2 MG PO TABS
2.0000 mg | ORAL_TABLET | Freq: Two times a day (BID) | ORAL | Status: DC
  Administered 2013-06-14 – 2013-06-15 (×2): 2 mg via ORAL
  Filled 2013-06-14 (×2): qty 1

## 2013-06-14 MED ORDER — PROMETHAZINE HCL 25 MG/ML IJ SOLN
25.0000 mg | Freq: Once | INTRAMUSCULAR | Status: AC
  Administered 2013-06-14: 25 mg via INTRAVENOUS
  Filled 2013-06-14: qty 1

## 2013-06-14 MED ORDER — MORPHINE SULFATE ER 15 MG PO TBCR
45.0000 mg | EXTENDED_RELEASE_TABLET | Freq: Two times a day (BID) | ORAL | Status: DC
  Administered 2013-06-14 – 2013-06-15 (×2): 45 mg via ORAL
  Filled 2013-06-14 (×2): qty 3

## 2013-06-14 MED ORDER — KETOROLAC TROMETHAMINE 30 MG/ML IJ SOLN
30.0000 mg | Freq: Once | INTRAMUSCULAR | Status: AC
  Administered 2013-06-14: 30 mg via INTRAVENOUS
  Filled 2013-06-14: qty 1

## 2013-06-14 MED ORDER — VENLAFAXINE HCL ER 150 MG PO CP24
150.0000 mg | ORAL_CAPSULE | Freq: Every day | ORAL | Status: DC
  Administered 2013-06-15: 150 mg via ORAL
  Filled 2013-06-14 (×2): qty 1

## 2013-06-14 MED ORDER — DIAZEPAM 5 MG PO TABS
10.0000 mg | ORAL_TABLET | Freq: Four times a day (QID) | ORAL | Status: DC | PRN
  Administered 2013-06-14 – 2013-06-15 (×3): 10 mg via ORAL
  Filled 2013-06-14 (×3): qty 2

## 2013-06-14 MED ORDER — MORPHINE SULFATE ER 30 MG PO TBCR: 45.0000 mg | EXTENDED_RELEASE_TABLET | Freq: Two times a day (BID) | ORAL | Status: DC

## 2013-06-14 MED ORDER — BUSPIRONE HCL 10 MG PO TABS
10.0000 mg | ORAL_TABLET | Freq: Two times a day (BID) | ORAL | Status: DC
  Administered 2013-06-14 – 2013-06-15 (×2): 10 mg via ORAL
  Filled 2013-06-14 (×2): qty 1

## 2013-06-14 MED ORDER — DIPHENHYDRAMINE HCL 50 MG/ML IJ SOLN
25.0000 mg | Freq: Once | INTRAMUSCULAR | Status: AC
  Administered 2013-06-14: 25 mg via INTRAVENOUS
  Filled 2013-06-14: qty 1

## 2013-06-14 MED ORDER — LORAZEPAM 2 MG/ML IJ SOLN
1.0000 mg | Freq: Once | INTRAMUSCULAR | Status: AC
  Administered 2013-06-14: 1 mg via INTRAVENOUS
  Filled 2013-06-14: qty 1

## 2013-06-14 MED ORDER — AMPHETAMINE-DEXTROAMPHETAMINE 10 MG PO TABS
10.0000 mg | ORAL_TABLET | Freq: Two times a day (BID) | ORAL | Status: DC
  Administered 2013-06-15 (×2): 10 mg via ORAL
  Filled 2013-06-14 (×2): qty 1
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
## 2013-06-14 NOTE — ED Notes (Signed)
Social worker Engineer, materials at bedside to eval pt.
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
## 2013-06-14 NOTE — ED Notes (Signed)
Bed: [facility] Expected date:  Expected time:  Means of arrival:  Comments: Hold for triage 4

## 2013-06-14 NOTE — ED Provider Notes (Signed)
CSN: [id number]     Arrival date & time 06/14/13  1526 History   First MD Initiated Contact with Patient 06/14/13 1634     Chief Complaint  Patient presents with  . Medical Clearance  . Headache  . Nausea     (Consider location/radiation/quality/duration/timing/severity/associated sxs/prior Treatment) Patient is a 48 y.o. female presenting with headaches. The history is provided by the patient.  Headache Associated symptoms: photophobia   Associated symptoms: no abdominal pain, no back pain, no diarrhea, no pain, no nausea, no neck stiffness, no numbness and no vomiting    patient presents for medical clearance and headache. She states she has a history of borderline personality disorder, PTSD and psychosis. She states she's been getting worse recently. She states she's been having hallucinations. She's had suicidal and homicidal thoughts. She denies substance abuse. She states she's been at [facility] and they did not help. She is a therapist and a psychiatrist. She states she has thought about cutting herself again. She states she has been taking her medications. She also has been having headaches with nausea photophobia. This is a typical headache for her that she's been dealing with for years. She states that her primary care doctor has her on a pain of 4. She states nothing works to help get better. The pain has been going for the last several days.  Past Medical History  Diagnosis Date  . Migraine   . PTSD (post-traumatic stress disorder)   . Anxiety   . Personality disorder   . Depression   . High cholesterol   . Hypothyroid   . ADHD (attention deficit hyperactivity disorder)    Past Surgical History  Procedure Laterality Date  . Cholecystectomy    . Mandible fracture surgery     History reviewed. No pertinent family history. History  Substance Use Topics  . Smoking status: Current Every Day Smoker -- 1.00 packs/day    Types: Cigarettes  . Smokeless tobacco:  Not on file  . Alcohol Use: No   OB History   Grav Para Term Preterm Abortions TAB SAB Ect Mult Living                 Review of Systems  Constitutional: Negative for activity change and appetite change.  Eyes: Positive for photophobia. Negative for pain.  Respiratory: Negative for chest tightness and shortness of breath.   Cardiovascular: Negative for chest pain and leg swelling.  Gastrointestinal: Negative for nausea, vomiting, abdominal pain and diarrhea.  Genitourinary: Negative for flank pain.  Musculoskeletal: Negative for back pain and neck stiffness.  Skin: Negative for rash.  Neurological: Positive for headaches. Negative for weakness and numbness.  Psychiatric/Behavioral: Positive for suicidal ideas, hallucinations and decreased concentration. Negative for behavioral problems. The patient is nervous/anxious.       Allergies  Compazine; Geodon; Haldol; Imitrex; Metoclopramide hcl; Penicillins; Wellbutrin; and Zofran  Home Medications   Prior to Admission medications   Medication Sig Start Date End Date Taking? Authorizing Provider  amphetamine-dextroamphetamine (ADDERALL) 20 MG tablet Take 20 mg by mouth 2 (two) times daily.   Yes Historical Provider, MD  busPIRone (BUSPAR) 10 MG tablet Take 10 mg by mouth 2 (two) times daily.   Yes Historical Provider, MD  diazepam (VALIUM) 10 MG tablet Take 10 mg by mouth every 6 (six) hours as needed.     Yes Historical Provider, MD  ezetimibe-simvastatin (VYTORIN) 10-80 MG per tablet Take 1 tablet by mouth at bedtime.     Yes  Historical Provider, MD  haloperidol (HALDOL) 2 MG tablet Take 2 mg by mouth 2 (two) times daily.   Yes Historical Provider, MD  levothyroxine (SYNTHROID, LEVOTHROID) 112 MCG tablet Take 112 mcg by mouth daily.     Yes Historical Provider, MD  lithium carbonate 300 MG capsule Take 300 mg by mouth 2 (two) times daily with a meal.   Yes Historical Provider, MD  oxymorphone (OPANA ER) 15 MG 12 hr tablet Take 15 mg  by mouth every 12 (twelve) hours.   Yes Historical Provider, MD  promethazine (PHENERGAN) 25 MG suppository Place 25 mg rectally every 6 (six) hours as needed (nausea).    Yes Historical Provider, MD  trazodone (DESYREL) 300 MG tablet Take 300 mg by mouth at bedtime.     Yes Historical Provider, MD  venlafaxine XR (EFFEXOR-XR) 150 MG 24 hr capsule Take 300 mg by mouth 2 (two) times daily.   Yes Historical Provider, MD  Vitamin D, Ergocalciferol, (DRISDOL) 50000 UNITS CAPS Take 50,000 Units by mouth every 7 (seven) days. Take on fridays    Yes Historical Provider, MD   BP 103/72  Pulse 84  Temp(Src) 98.5 F (36.9 C) (Oral)  Resp 16  SpO2 95%  LMP 12/04/2010 Physical Exam  Nursing note and vitals reviewed. Constitutional: She is oriented to person, place, and time. She appears well-developed and well-nourished.  HENT:  Head: Normocephalic and atraumatic.  Eyes: EOM are normal. Pupils are equal, round, and reactive to light.  Patient is wearing sunglasses  Neck: Normal range of motion. Neck supple.  Cardiovascular: Normal rate, regular rhythm and normal heart sounds.   No murmur heard. Pulmonary/Chest: Effort normal and breath sounds normal. No respiratory distress. She has no wheezes. She has no rales.  Abdominal: Soft. Bowel sounds are normal. She exhibits no distension. There is no tenderness. There is no rebound and no guarding.  Musculoskeletal: Normal range of motion.  Neurological: She is alert and oriented to person, place, and time. No cranial nerve deficit.  Skin: Skin is warm and dry.  Psychiatric: Her speech is normal.  Patient appears somewhat pressured    ED Course  Procedures (including critical care time) Labs Review Labs Reviewed  CBC - Abnormal; Notable for the following:    WBC 12.8 (*)    All other components within normal limits  COMPREHENSIVE METABOLIC PANEL - Abnormal; Notable for the following:    Total Bilirubin <0.2 (*)    GFR calc non Af Amer 83 (*)     All other components within normal limits  URINE RAPID DRUG SCREEN (HOSP PERFORMED) - Abnormal; Notable for the following:    Benzodiazepines POSITIVE (*)    All other components within normal limits  LITHIUM LEVEL - Abnormal; Notable for the following:    Lithium Lvl 0.60 (*)    All other components within normal limits  ETHANOL    Imaging Review No results found.   EKG Interpretation None      MDM   Final diagnoses:  Suicidal ideations  Hallucinations  Chronic headaches    Patient presents with hallucinations and suicidal and homicidal ideations. She presents for medical clearance. She also has chronic headaches. No relief with medications. She is medically cleared and has been seen by TTS and believe that it inpatient treatment. There is no beds behavioral health. Patient was started back on her home medications. Narcotics were reviewed in the controlled substance database.   Juliet RudeNathan R. Rubin PayorPickering, MD 06/15/13 0001

## 2013-06-14 NOTE — ED Notes (Signed)
Patient A&O x4. Has been seen recently at [facility] but reports "they didn't do anything for me." Does endorse SI with plan of cutting wrists. Did become tearful saying "I just feel so depressed and I'm in pain." In hall bed with direct supervision. Ambulatory. Calm and cooperative. MD at bedside.

## 2013-06-14 NOTE — ED Notes (Signed)
Pt has one purple bag with clothes and hairbrush. Pt has a hospital belonging bag with clothes and flip flops.  Locked in locker 32 in TCU
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
## 2013-06-15 DIAGNOSIS — F332 Major depressive disorder, recurrent severe without psychotic features: Secondary | ICD-10-CM

## 2013-06-15 DIAGNOSIS — F329 Major depressive disorder, single episode, unspecified: Secondary | ICD-10-CM | POA: Diagnosis present

## 2013-06-15 DIAGNOSIS — F4310 Post-traumatic stress disorder, unspecified: Secondary | ICD-10-CM

## 2013-06-15 DIAGNOSIS — R45851 Suicidal ideations: Secondary | ICD-10-CM

## 2013-06-15 MED ORDER — BUTALBITAL-APAP-CAFFEINE 50-325-40 MG PO TABS
2.0000 | ORAL_TABLET | Freq: Four times a day (QID) | ORAL | Status: DC | PRN
  Administered 2013-06-15: 2 via ORAL
  Filled 2013-06-15: qty 2

## 2013-06-15 NOTE — ED Notes (Addendum)
The Clinical research associate provided and explained the Wellness Activity Sheets for group therapy. The writer encouraged the patient to complete the activity; the sheets are at the patients bedside.
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
## 2013-06-15 NOTE — BH Assessment (Signed)
Per Clint Bolder, [facility] at [facility], there are no 400-hall beds available. Contacted the following facilities for placement:  BED AVAILABLE. FAXED CLINICAL INFORMATION:  [facility]: Per Lucilla Edin: Per [facility]: Per Greggory Stallion   AT CAPACITY:  [facility]: Per [facility]: Per [facility]: Per Dorathy Daft  [facility]: Per Mi[facility]: Per [facility]: Per Scheryl Mart[facility]: Per [facility]: Per Gwenette Greet  [facility]: Per [facility]: Per [facility]: Per [facility]   Pamalee Leyden, [facility], [facility] Triage Specialist [phone]

## 2013-06-15 NOTE — BH Assessment (Signed)
Received call from [place] at [facility] and Dr. Regino Schultze has accepted Pt after 0800. RN report should be called to [phone]. Notified Dr. Loren Racer and Hansel Starling, RN of acceptance.  Gunnar Fusi then called back and said Dr. Regino Schultze would only accept Pt is Pt was IVC. Discussed situation with Dr. Ranae Palms who agreed to complete IVC. Notified Gunnar Fusi at [facility] that we will accept their conditions and she agreed to accept Pt. Notified Hansel Starling, RN and Beatriz Stallion, TTS of situation.  Harlin Rain Ria Comment, [facility] Triage Specialist [phone]

## 2013-07-20 ENCOUNTER — Emergency Department (HOSPITAL_COMMUNITY)
Admission: EM | Admit: 2013-07-20 | Discharge: 2013-07-20 | Disposition: A | Discharge status: Home / Self Care | Payer: Medicare Other | Attending: Emergency Medicine | Admitting: Emergency Medicine

## 2013-07-20 ENCOUNTER — Encounter (HOSPITAL_COMMUNITY): Payer: Self-pay | Admitting: Emergency Medicine

## 2013-07-20 DIAGNOSIS — E7800 Pure hypercholesterolemia, unspecified: Secondary | ICD-10-CM | POA: Diagnosis not present

## 2013-07-20 DIAGNOSIS — F410 Panic disorder [episodic paroxysmal anxiety] without agoraphobia: Secondary | ICD-10-CM | POA: Insufficient documentation

## 2013-07-20 DIAGNOSIS — Z79899 Other long term (current) drug therapy: Secondary | ICD-10-CM | POA: Insufficient documentation

## 2013-07-20 DIAGNOSIS — E039 Hypothyroidism, unspecified: Secondary | ICD-10-CM | POA: Insufficient documentation

## 2013-07-20 DIAGNOSIS — G43009 Migraine without aura, not intractable, without status migrainosus: Secondary | ICD-10-CM | POA: Diagnosis present

## 2013-07-20 DIAGNOSIS — F411 Generalized anxiety disorder: Secondary | ICD-10-CM | POA: Diagnosis not present

## 2013-07-20 DIAGNOSIS — F17200 Nicotine dependence, unspecified, uncomplicated: Secondary | ICD-10-CM | POA: Insufficient documentation

## 2013-07-20 DIAGNOSIS — F4310 Post-traumatic stress disorder, unspecified: Secondary | ICD-10-CM | POA: Insufficient documentation

## 2013-07-20 DIAGNOSIS — G43909 Migraine, unspecified, not intractable, without status migrainosus: Secondary | ICD-10-CM

## 2013-07-20 DIAGNOSIS — F329 Major depressive disorder, single episode, unspecified: Secondary | ICD-10-CM | POA: Diagnosis not present

## 2013-07-20 DIAGNOSIS — F909 Attention-deficit hyperactivity disorder, unspecified type: Secondary | ICD-10-CM | POA: Diagnosis not present

## 2013-07-20 DIAGNOSIS — Z880 Allergy status to penicillin: Secondary | ICD-10-CM | POA: Diagnosis not present

## 2013-07-20 DIAGNOSIS — F3289 Other specified depressive episodes: Secondary | ICD-10-CM | POA: Diagnosis not present

## 2013-07-20 MED ORDER — HYDROMORPHONE HCL PF 1 MG/ML IJ SOLN
0.5000 mg | Freq: Once | INTRAMUSCULAR | Status: AC
  Administered 2013-07-20: 0.5 mg via INTRAVENOUS
  Filled 2013-07-20: qty 1

## 2013-07-20 MED ORDER — FENTANYL CITRATE 0.05 MG/ML IJ SOLN
50.0000 ug | Freq: Once | INTRAMUSCULAR | Status: AC
  Administered 2013-07-20: 50 ug via INTRAVENOUS
  Filled 2013-07-20: qty 2

## 2013-07-20 MED ORDER — LORAZEPAM 2 MG/ML IJ SOLN
1.0000 mg | Freq: Once | INTRAMUSCULAR | Status: AC
Start: 2013-07-20 — End: 2013-07-20
  Administered 2013-07-20: 1 mg via INTRAVENOUS
  Filled 2013-07-20: qty 1

## 2013-07-20 MED ORDER — PROMETHAZINE HCL 25 MG/ML IJ SOLN
25.0000 mg | Freq: Once | INTRAMUSCULAR | Status: AC
  Administered 2013-07-20: 25 mg via INTRAVENOUS
  Filled 2013-07-20: qty 1

## 2013-07-20 MED ORDER — DIPHENHYDRAMINE HCL 50 MG/ML IJ SOLN
25.0000 mg | Freq: Once | INTRAMUSCULAR | Status: AC
  Administered 2013-07-20: 25 mg via INTRAVENOUS
  Filled 2013-07-20: qty 1

## 2013-07-20 MED ORDER — SODIUM CHLORIDE 0.9 % IV BOLUS (SEPSIS)
1000.0000 mL | Freq: Once | INTRAVENOUS | Status: AC
  Administered 2013-07-20: 1000 mL via INTRAVENOUS

## 2013-07-20 NOTE — Discharge Instructions (Signed)

## 2013-07-20 NOTE — ED Notes (Signed)
Patient called out for pain medication and coke. Courtney PA made aware.

## 2013-07-20 NOTE — ED Provider Notes (Signed)
CSN: [id number]     Arrival date & time 07/20/13  1016 History   First MD Initiated Contact with Patient 07/20/13 1034     Chief Complaint  Patient presents with  . Migraine   HPI Comments: Patient states that this seems like her usual headache pains.  She denies any recent illnesses, neck stiffness, fever, rash, changes in vision, or numbness and weakness.  Patient states that she is also having a panic attack at this time.    Patient is a 48 y.o. female presenting with migraines.  Migraine This is a recurrent problem. The current episode started yesterday. The problem occurs constantly. The problem has been unchanged. Associated symptoms include fatigue, headaches, nausea and vomiting. Pertinent negatives include no abdominal pain, anorexia, arthralgias, change in bowel habit, chest pain, chills, congestion, coughing, diaphoresis, fever, joint swelling, myalgias, neck pain, numbness, rash, sore throat, swollen glands, urinary symptoms, vertigo, visual change or weakness. Exacerbated by: Sherlynn St[place] and [place]. She has tried NSAIDs for the symptoms. The treatment provided no relief.    Past Medical History  Diagnosis Date  . Migraine   . PTSD (post-traumatic stress disorder)   . Anxiety   . Personality disorder   . Depression   . High cholesterol   . Hypothyroid   . ADHD (attention deficit hyperactivity disorder)    Past Surgical History  Procedure Laterality Date  . Cholecystectomy    . Mandible fracture surgery     No family history on file. History  Substance Use Topics  . Smoking status: Current Every Day Smoker -- 1.00 packs/day    Types: Cigarettes  . Smokeless tobacco: Not on file  . Alcohol Use: No   OB History   Grav Para Term Preterm Abortions TAB SAB Ect Mult Living                 Review of Systems  Constitutional: Positive for fatigue. Negative for fever, chills and diaphoresis.  HENT: Negative for congestion and sore throat.   Respiratory: Negative for cough.    Cardiovascular: Negative for chest pain.  Gastrointestinal: Positive for nausea and vomiting. Negative for abdominal pain, diarrhea, constipation, anorexia and change in bowel habit.  Musculoskeletal: Negative for arthralgias, joint swelling, myalgias and neck pain.  Skin: Negative for rash.  Neurological: Positive for headaches. Negative for vertigo, weakness and numbness.  All other systems reviewed and are negative.     Allergies  Other; Compazine; Geodon; Imitrex; Metoclopramide hcl; Penicillins; Wellbutrin; and Zofran  Home Medications   Prior to Admission medications   Medication Sig Start Date End Date Taking? Authorizing Provider  amphetamine-dextroamphetamine (ADDERALL) 20 MG tablet Take 20 mg by mouth 2 (two) times daily.   Yes Historical Provider, MD  busPIRone (BUSPAR) 10 MG tablet Take 10 mg by mouth 2 (two) times daily.   Yes Historical Provider, MD  diazepam (VALIUM) 10 MG tablet Take 10 mg by mouth every 6 (six) hours as needed.     Yes Historical Provider, MD  ezetimibe-simvastatin (VYTORIN) 10-80 MG per tablet Take 1 tablet by mouth at bedtime.     Yes Historical Provider, MD  haloperidol (HALDOL) 2 MG tablet Take 2 mg by mouth 2 (two) times daily.   Yes Historical Provider, MD  levothyroxine (SYNTHROID, LEVOTHROID) 125 MCG tablet Take 125 mcg by mouth daily before breakfast.   Yes Historical Provider, MD  lithium carbonate 300 MG capsule Take 300 mg by mouth 2 (two) times daily with a meal.   Yes Historical Provider,  MD  oxymorphone (OPANA ER) 15 MG 12 hr tablet Take 15 mg by mouth every 12 (twelve) hours.   Yes Historical Provider, MD  prazosin (MINIPRESS) 1 MG capsule Take 2 mg by mouth at bedtime.   Yes Historical Provider, MD  trazodone (DESYREL) 300 MG tablet Take 300 mg by mouth at bedtime.     Yes Historical Provider, MD  venlafaxine XR (EFFEXOR-XR) 150 MG 24 hr capsule Take 300 mg by mouth 2 (two) times daily.   Yes Historical Provider, MD  Vitamin D,  Ergocalciferol, (DRISDOL) 50000 UNITS CAPS Take 50,000 Units by mouth every 7 (seven) days. Take on fridays    Yes Historical Provider, MD   BP 101/64  Pulse 70  Temp(Src) 99.4 F (37.4 C) (Oral)  Resp 14  Ht 5\' 3"  (1.6 m)  Wt 130 lb (58.968 kg)  BMI 23.03 kg/m2  SpO2 94%  LMP 12/04/2010 Physical Exam  Nursing note and vitals reviewed. Constitutional: She is oriented to person, place, and time. She appears well-developed and well-nourished. No distress.  HENT:  Head: Normocephalic and atraumatic.  Mouth/Throat: Oropharynx is clear and moist. No oropharyngeal exudate.  Eyes: Conjunctivae and EOM are normal. Pupils are equal, round, and reactive to light. No scleral icterus.  Neck: Normal range of motion. Neck supple. No JVD present. No Brudzinski's sign and no Kernig's sign noted. No thyromegaly present.  Cardiovascular: Normal rate, regular rhythm, normal heart sounds and intact distal pulses.  Exam reveals no gallop and no friction rub.   No murmur heard. Pulmonary/Chest: Effort normal and breath sounds normal. No respiratory distress. She has no wheezes. She has no rales. She exhibits no tenderness.  Abdominal: Soft. Bowel sounds are normal. She exhibits no distension and no mass. There is no tenderness. There is no rebound and no guarding.  Musculoskeletal: Normal range of motion.  Lymphadenopathy:    She has no cervical adenopathy.  Neurological: She is alert and oriented to person, place, and time. No cranial nerve deficit. Coordination normal.  Skin: Skin is warm and dry. She is not diaphoretic.  Psychiatric: Her speech is normal and behavior is normal. Judgment and thought content normal. Her mood appears anxious. She is not agitated, not aggressive, not hyperactive, not slowed, not withdrawn, not actively hallucinating and not combative. She expresses no homicidal and no suicidal ideation. She expresses no suicidal plans and no homicidal plans. She is attentive.    ED Course   Procedures (including critical care time) Labs Review Labs Reviewed - No data to display  Imaging Review No results found.   EKG Interpretation None      MDM   Final diagnoses:  Panic attack  Migraine without status migrainosus, not intractable, unspecified migraine type   Patient is a 48 y.o. Female who [facility] with migraine headache x 2 days and panic attack.  Patient states that this is similar to her usual migraines.  Patient is allergic to the majority of medications in the usual headache cocktail.  I have given her 1 L of NS, benadryl, phenergan, and fentanyl with little relief.  Patient was then given 1 mg dilaudid which gave her some relief.  Patient's anxiety attack was treated with 1 mg of ativan.  She states that her anxiety is much improved.  There are no focal neuro deficits on exam, negative kernig and brudzinski, and no meningeal signs at this time.  Patient is stable to be discharged home at this time.  I have advised  her to follow-up with her PCP for further care.  She states her understanding.  She was given return precautions of headache associated with fever and neck stiffness and stroke symptoms.      Clydie Braunourtney A Forucci, PA-C 07/20/13 1422

## 2013-07-20 NOTE — ED Notes (Signed)
NAD noted. VS are wnl. IV taken out. Pt given discharge instructions. All questions answered. Pt discharged by wheelchair with family home.

## 2013-07-22 ENCOUNTER — Encounter (HOSPITAL_COMMUNITY): Payer: Self-pay | Admitting: Emergency Medicine

## 2013-07-22 ENCOUNTER — Emergency Department (HOSPITAL_COMMUNITY)
Admission: EM | Admit: 2013-07-22 | Discharge: 2013-07-23 | Disposition: A | Discharge status: Home / Self Care | Payer: Medicare Other | Attending: Emergency Medicine | Admitting: Emergency Medicine

## 2013-07-22 DIAGNOSIS — F909 Attention-deficit hyperactivity disorder, unspecified type: Secondary | ICD-10-CM | POA: Insufficient documentation

## 2013-07-22 DIAGNOSIS — E7800 Pure hypercholesterolemia, unspecified: Secondary | ICD-10-CM | POA: Diagnosis not present

## 2013-07-22 DIAGNOSIS — F329 Major depressive disorder, single episode, unspecified: Secondary | ICD-10-CM | POA: Diagnosis not present

## 2013-07-22 DIAGNOSIS — E039 Hypothyroidism, unspecified: Secondary | ICD-10-CM | POA: Insufficient documentation

## 2013-07-22 DIAGNOSIS — F3289 Other specified depressive episodes: Secondary | ICD-10-CM | POA: Insufficient documentation

## 2013-07-22 DIAGNOSIS — G43109 Migraine with aura, not intractable, without status migrainosus: Secondary | ICD-10-CM | POA: Diagnosis not present

## 2013-07-22 DIAGNOSIS — R51 Headache: Secondary | ICD-10-CM | POA: Insufficient documentation

## 2013-07-22 DIAGNOSIS — R519 Headache, unspecified: Secondary | ICD-10-CM

## 2013-07-22 DIAGNOSIS — Z79899 Other long term (current) drug therapy: Secondary | ICD-10-CM | POA: Insufficient documentation

## 2013-07-22 DIAGNOSIS — Z880 Allergy status to penicillin: Secondary | ICD-10-CM | POA: Insufficient documentation

## 2013-07-22 DIAGNOSIS — F17200 Nicotine dependence, unspecified, uncomplicated: Secondary | ICD-10-CM | POA: Diagnosis not present

## 2013-07-22 DIAGNOSIS — F4310 Post-traumatic stress disorder, unspecified: Secondary | ICD-10-CM | POA: Diagnosis not present

## 2013-07-22 DIAGNOSIS — F411 Generalized anxiety disorder: Secondary | ICD-10-CM | POA: Insufficient documentation

## 2013-07-22 DIAGNOSIS — F609 Personality disorder, unspecified: Secondary | ICD-10-CM | POA: Insufficient documentation

## 2013-07-22 MED ORDER — OXYCODONE-ACETAMINOPHEN 5-325 MG PO TABS: 1.0000 | ORAL_TABLET | ORAL | Status: DC | PRN

## 2013-07-22 MED ORDER — DIPHENHYDRAMINE HCL 50 MG/ML IJ SOLN
25.0000 mg | Freq: Once | INTRAMUSCULAR | Status: AC
  Administered 2013-07-22: 25 mg via INTRAVENOUS
  Filled 2013-07-22: qty 1

## 2013-07-22 MED ORDER — FENTANYL CITRATE 0.05 MG/ML IJ SOLN
100.0000 ug | Freq: Once | INTRAMUSCULAR | Status: AC
Start: 2013-07-22 — End: 2013-07-22
  Administered 2013-07-22: 100 ug via INTRAVENOUS
  Filled 2013-07-22: qty 2

## 2013-07-22 MED ORDER — HYDROMORPHONE HCL PF 1 MG/ML IJ SOLN
1.0000 mg | Freq: Once | INTRAMUSCULAR | Status: AC
  Administered 2013-07-22: 1 mg via INTRAVENOUS
  Filled 2013-07-22: qty 1

## 2013-07-22 MED ORDER — PROMETHAZINE HCL 25 MG PO TABS: 25.0000 mg | ORAL_TABLET | Freq: Four times a day (QID) | ORAL | Status: DC | PRN

## 2013-07-22 MED ORDER — PROMETHAZINE HCL 25 MG/ML IJ SOLN
25.0000 mg | Freq: Once | INTRAMUSCULAR | Status: AC
  Administered 2013-07-22: 25 mg via INTRAVENOUS
  Filled 2013-07-22: qty 1

## 2013-07-22 MED ORDER — LORAZEPAM 2 MG/ML IJ SOLN
1.0000 mg | Freq: Once | INTRAMUSCULAR | Status: AC
  Administered 2013-07-22: 1 mg via INTRAVENOUS
  Filled 2013-07-22: qty 1

## 2013-07-22 NOTE — ED Provider Notes (Signed)
[id number] - 48 year old female resents to the emergency department for headache. She has been seen multiple times in the ED for headache, most recently 2 days ago. Care assumed at shift change from Dr. Estell HarpinZammit. Plan includes discharge when headache begins to improve.  Patient treated in ED with Dilaudid, Benadryl, fentanyl, Ativan, and Phenergan. Patient has had some improvement in symptoms since arrival with this regimen. Headache is not completely resolved; however, I believe patient is stable for discharge with further management as outpatient. Patient prescribed Percocet and Phenergan by Dr. Estell HarpinZammit. Return precautions provided.  Patient discharged from the ED in good condition. VSS.   Filed Vitals:   07/22/13 2100 07/22/13 2145 07/22/13 2230 07/22/13 2315  BP: 99/64 99/60 102/65 102/63  Pulse: 84 87 74   Temp:      TempSrc:      Resp:      SpO2: 95% 96% 94%      Antony MaduraKelly Lavaya Defreitas, PA-C 07/22/13 2335

## 2013-07-22 NOTE — Discharge Instructions (Signed)
Follow up with your md this week. °

## 2013-07-22 NOTE — ED Provider Notes (Signed)
Medical screening examination/treatment/procedure(s) were performed by non-physician practitioner and as supervising physician I was immediately available for consultation/collaboration.   EKG Interpretation None       Kla Bily, MD 07/22/13 1101 

## 2013-07-22 NOTE — ED Provider Notes (Signed)
CSN: [id number]     Arrival date & time 07/22/13  1832 History   First MD Initiated Contact with Patient 07/22/13 1918     Chief Complaint  Patient presents with  . Headache     (Consider location/radiation/quality/duration/timing/severity/associated sxs/prior Treatment) Patient is a 48 y.o. female presenting with headaches. The history is provided by the patient (pt complains of a headache). No language interpreter was used.  Headache Pain location:  Frontal Quality:  Dull Radiates to:  Does not radiate Severity currently:  4/10 Severity at highest:  6/10 Onset quality:  Gradual Timing:  Constant Progression:  Worsening Chronicity:  Recurrent Associated symptoms: no abdominal pain, no back pain, no congestion, no cough, no diarrhea, no fatigue, no seizures and no sinus pressure     Past Medical History  Diagnosis Date  . Migraine   . PTSD (post-traumatic stress disorder)   . Anxiety   . Personality disorder   . Depression   . High cholesterol   . Hypothyroid   . ADHD (attention deficit hyperactivity disorder)    Past Surgical History  Procedure Laterality Date  . Cholecystectomy    . Mandible fracture surgery     No family history on file. History  Substance Use Topics  . Smoking status: Current Every Day Smoker -- 1.00 packs/day    Types: Cigarettes  . Smokeless tobacco: Not on file  . Alcohol Use: No   OB History   Grav Para Term Preterm Abortions TAB SAB Ect Mult Living                 Review of Systems  Constitutional: Negative for appetite change and fatigue.  HENT: Negative for congestion, ear discharge and sinus pressure.   Eyes: Negative for discharge.  Respiratory: Negative for cough.   Cardiovascular: Negative for chest pain.  Gastrointestinal: Negative for abdominal pain and diarrhea.  Genitourinary: Negative for frequency and hematuria.  Musculoskeletal: Negative for back pain.  Skin: Negative for rash.  Neurological: Positive for headaches.  Negative for seizures.  Psychiatric/Behavioral: Negative for hallucinations.      Allergies  Other; Compazine; Geodon; Imitrex; Metoclopramide hcl; Penicillins; Wellbutrin; and Zofran  Home Medications   Prior to Admission medications   Medication Sig Start Date End Date Taking? Authorizing Provider  amphetamine-dextroamphetamine (ADDERALL) 20 MG tablet Take 20 mg by mouth 2 (two) times daily.    Historical Provider, MD  busPIRone (BUSPAR) 10 MG tablet Take 10 mg by mouth 2 (two) times daily.    Historical Provider, MD  diazepam (VALIUM) 10 MG tablet Take 10 mg by mouth every 6 (six) hours as needed.      Historical Provider, MD  ezetimibe-simvastatin (VYTORIN) 10-80 MG per tablet Take 1 tablet by mouth at bedtime.      Historical Provider, MD  haloperidol (HALDOL) 2 MG tablet Take 2 mg by mouth 2 (two) times daily.    Historical Provider, MD  levothyroxine (SYNTHROID, LEVOTHROID) 125 MCG tablet Take 125 mcg by mouth daily before breakfast.    Historical Provider, MD  lithium carbonate 300 MG capsule Take 300 mg by mouth 2 (two) times daily with a meal.    Historical Provider, MD  oxymorphone (OPANA ER) 15 MG 12 hr tablet Take 15 mg by mouth every 12 (twelve) hours.    Historical Provider, MD  prazosin (MINIPRESS) 1 MG capsule Take 2 mg by mouth at bedtime.    Historical Provider, MD  trazodone (DESYREL) 300 MG tablet Take 300 mg by mouth at  bedtime.      Historical Provider, MD  venlafaxine XR (EFFEXOR-XR) 150 MG 24 hr capsule Take 300 mg by mouth 2 (two) times daily.    Historical Provider, MD  Vitamin D, Ergocalciferol, (DRISDOL) 50000 UNITS CAPS Take 50,000 Units by mouth every 7 (seven) days. Take on fridays     Historical Provider, MD   BP 116/72  Pulse 99  Temp(Src) 98.7 F (37.1 C) (Oral)  Resp 18  SpO2 98%  LMP 12/04/2010 Physical Exam  Constitutional: She is oriented to person, place, and time. She appears well-developed.  HENT:  Head: Normocephalic.  Eyes: Conjunctivae  and EOM are normal. No scleral icterus.  Neck: Neck supple. No thyromegaly present.  Cardiovascular: Normal rate and regular rhythm.  Exam reveals no gallop and no friction rub.   No murmur heard. Pulmonary/Chest: No stridor. She has no wheezes. She has no rales. She exhibits no tenderness.  Abdominal: She exhibits no distension. There is no tenderness. There is no rebound.  Musculoskeletal: Normal range of motion. She exhibits no edema.  Lymphadenopathy:    She has no cervical adenopathy.  Neurological: She is oriented to person, place, and time. She exhibits normal muscle tone. Coordination normal.  Skin: No rash noted. No erythema.  Psychiatric: She has a normal mood and affect. Her behavior is normal.    ED Course  Procedures (including critical care time) Labs Review Labs Reviewed - No data to display  Imaging Review No results found.   EKG Interpretation None      MDM   Final diagnoses:  None    Chronic headaches    Benny LennertJoseph L Vaidehi Braddy, MD 07/22/13 [phone]

## 2013-07-22 NOTE — ED Notes (Signed)
The tp is c/o a migraine headache since Friday with nv.  She is also c/o being anxious and her  Valium is not helping

## 2013-07-23 NOTE — ED Provider Notes (Signed)
Medical screening examination/treatment/procedure(s) were performed by non-physician practitioner and as supervising physician I was immediately available for consultation/collaboration.   EKG Interpretation None        Miyoshi Ligas T Ilee Randleman, MD 07/23/13 1210 

## 2013-07-23 NOTE — ED Notes (Signed)
Patient reports she is feeling better,  1/10 pain. She verbalized understanding of discharge instructions. mother is taking her home

## 2013-08-25 ENCOUNTER — Emergency Department (HOSPITAL_COMMUNITY)
Admission: EM | Admit: 2013-08-25 | Discharge: 2013-08-25 | Disposition: A | Discharge status: Home / Self Care | Payer: Medicare Other | Attending: Emergency Medicine | Admitting: Emergency Medicine

## 2013-08-25 ENCOUNTER — Encounter (HOSPITAL_COMMUNITY): Payer: Self-pay | Admitting: Emergency Medicine

## 2013-08-25 DIAGNOSIS — F329 Major depressive disorder, single episode, unspecified: Secondary | ICD-10-CM | POA: Diagnosis not present

## 2013-08-25 DIAGNOSIS — E039 Hypothyroidism, unspecified: Secondary | ICD-10-CM | POA: Diagnosis not present

## 2013-08-25 DIAGNOSIS — F3289 Other specified depressive episodes: Secondary | ICD-10-CM | POA: Diagnosis not present

## 2013-08-25 DIAGNOSIS — M542 Cervicalgia: Secondary | ICD-10-CM | POA: Diagnosis not present

## 2013-08-25 DIAGNOSIS — E7800 Pure hypercholesterolemia, unspecified: Secondary | ICD-10-CM | POA: Insufficient documentation

## 2013-08-25 DIAGNOSIS — F411 Generalized anxiety disorder: Secondary | ICD-10-CM | POA: Diagnosis not present

## 2013-08-25 DIAGNOSIS — Z880 Allergy status to penicillin: Secondary | ICD-10-CM | POA: Insufficient documentation

## 2013-08-25 DIAGNOSIS — G43009 Migraine without aura, not intractable, without status migrainosus: Secondary | ICD-10-CM

## 2013-08-25 DIAGNOSIS — G43909 Migraine, unspecified, not intractable, without status migrainosus: Secondary | ICD-10-CM | POA: Diagnosis not present

## 2013-08-25 DIAGNOSIS — F17200 Nicotine dependence, unspecified, uncomplicated: Secondary | ICD-10-CM | POA: Diagnosis not present

## 2013-08-25 DIAGNOSIS — F4310 Post-traumatic stress disorder, unspecified: Secondary | ICD-10-CM | POA: Insufficient documentation

## 2013-08-25 DIAGNOSIS — Z79899 Other long term (current) drug therapy: Secondary | ICD-10-CM | POA: Insufficient documentation

## 2013-08-25 MED ORDER — HYDROMORPHONE HCL PF 1 MG/ML IJ SOLN
1.0000 mg | Freq: Once | INTRAMUSCULAR | Status: AC
  Administered 2013-08-25: 1 mg via INTRAVENOUS
  Filled 2013-08-25: qty 1

## 2013-08-25 MED ORDER — DIPHENHYDRAMINE HCL 50 MG/ML IJ SOLN
50.0000 mg | Freq: Once | INTRAMUSCULAR | Status: AC
  Administered 2013-08-25: 50 mg via INTRAVENOUS
  Filled 2013-08-25: qty 1

## 2013-08-25 MED ORDER — PROMETHAZINE HCL 25 MG/ML IJ SOLN
25.0000 mg | Freq: Once | INTRAMUSCULAR | Status: AC
  Administered 2013-08-25: 25 mg via INTRAVENOUS
  Filled 2013-08-25: qty 1

## 2013-08-25 MED ORDER — SODIUM CHLORIDE 0.9 % IV BOLUS (SEPSIS)
1000.0000 mL | Freq: Once | INTRAVENOUS | Status: AC
  Administered 2013-08-25: 1000 mL via INTRAVENOUS

## 2013-08-25 NOTE — Discharge Instructions (Signed)
Please follow up with a neurologist for further management of your recurrent headache.   Recurrent Migraine Headache A migraine headache is very bad, throbbing pain on one or both sides of your head. Recurrent migraines keep coming back. Talk to your doctor about what things may bring on (trigger) your migraine headaches. HOME CARE  Only take medicines as told by your doctor.  Lie down in a dark, quiet room when you have a migraine.  Keep a journal to find out if certain things bring on migraine headaches. For example, write down:  What you eat and drink.  How much sleep you get.  Any change to your diet or medicines.  Lessen how much alcohol you drink.  Quit smoking if you smoke.  Get enough sleep.  Lessen any stress in your life.  Keep lights dim if bright lights bother you or make your migraines worse. GET HELP IF:  Medicine does not help your migraines.  Your pain keeps coming back.  You have a fever. GET HELP RIGHT AWAY IF:   Your migraine becomes really bad.  You have a stiff neck.  You have trouble seeing.  Your muscles are weak, or you lose muscle control.  You lose your balance or have trouble walking.  You feel like you will pass out (faint), or you pass out.  You have really bad symptoms that are different than your first symptoms. MAKE SURE YOU:   Understand these instructions.  Will watch your condition.  Will get help right away if you are not doing well or get worse. Document Released: 10/14/2007 Document Revised: 01/09/2013 Document Reviewed: 09/11/2012 [facility] Patient Information 2015 [place], [place]. This information is not intended to replace advice given to you by your health care provider. Make sure you discuss any questions you have with your health care provider.

## 2013-08-25 NOTE — ED Provider Notes (Signed)
CSN: [id number]     Arrival date & time 08/25/13  1909 History   First MD Initiated Contact with Patient 08/25/13 2000     Chief Complaint  Patient presents with  . Migraine     (Consider location/radiation/quality/duration/timing/severity/associated sxs/prior Treatment) HPI  48 year old female with history of recurrent migraine, PTSD, personality disorder who presents for evaluation of headache. Patient report recurrent headaches as common as 4 times a week. This particular headache is worse than usual. There has been persistent since yesterday afternoon. Onset is gradual, pain is 10 out of 10. She described it as a sharp throbbing sensation to her forehead radiates to her neck with associated light and sound sensitivity, nausea vomiting, and not able to keep her medication down. She has had similar headaches in the past. There is no associated fever, hearing loss, vision changes, chest pain, shortness of breath, new numbness weakness or rash. She plan on followup with a neurologist next week. She request for a migraine cocktail which has helped her in the past. Her last ER visit was a month ago. She is well-known to the ER.  Past Medical History  Diagnosis Date  . Migraine   . PTSD (post-traumatic stress disorder)   . Anxiety   . Personality disorder   . Depression   . High cholesterol   . Hypothyroid   . ADHD (attention deficit hyperactivity disorder)    Past Surgical History  Procedure Laterality Date  . Cholecystectomy    . Mandible fracture surgery     No family history on file. History  Substance Use Topics  . Smoking status: Current Every Day Smoker -- 1.00 packs/day    Types: Cigarettes  . Smokeless tobacco: Not on file  . Alcohol Use: No   OB History   Grav Para Term Preterm Abortions TAB SAB Ect Mult Living                 Review of Systems  Constitutional: Negative for fever.  Musculoskeletal: Positive for neck pain.  Skin: Negative for rash.  Neurological:  Positive for headaches. Negative for numbness.  All other systems reviewed and are negative.     Allergies  Other; Toradol; Compazine; Geodon; Imitrex; Metoclopramide hcl; Penicillins; Wellbutrin; and Zofran  Home Medications   Prior to Admission medications   Medication Sig Start Date End Date Taking? Authorizing Provider  amphetamine-dextroamphetamine (ADDERALL) 20 MG tablet Take 20 mg by mouth 2 (two) times daily.    Historical Provider, MD  busPIRone (BUSPAR) 10 MG tablet Take 10 mg by mouth 2 (two) times daily.    Historical Provider, MD  diazepam (VALIUM) 10 MG tablet Take 10 mg by mouth 4 (four) times daily.     Historical Provider, MD  ezetimibe-simvastatin (VYTORIN) 10-80 MG per tablet Take 1 tablet by mouth at bedtime.      Historical Provider, MD  haloperidol (HALDOL) 2 MG tablet Take 2 mg by mouth 2 (two) times daily.    Historical Provider, MD  levothyroxine (SYNTHROID, LEVOTHROID) 125 MCG tablet Take 125 mcg by mouth daily before breakfast.    Historical Provider, MD  lithium carbonate 300 MG capsule Take 300 mg by mouth 2 (two) times daily with a meal.    Historical Provider, MD  oxyCODONE-acetaminophen (PERCOCET/ROXICET) 5-325 MG per tablet Take 1 tablet by mouth every 4 (four) hours as needed for moderate pain or severe pain. 07/22/13   Benny Lennert, MD  oxymorphone (OPANA ER) 15 MG 12 hr tablet Take 15 mg  by mouth every 12 (twelve) hours.    Historical Provider, MD  prazosin (MINIPRESS) 1 MG capsule Take 2 mg by mouth at bedtime.    Historical Provider, MD  promethazine (PHENERGAN) 25 MG tablet Take 1 tablet (25 mg total) by mouth every 6 (six) hours as needed for nausea or vomiting. 07/22/13   Benny LennertJoseph L Zammit, MD  trazodone (DESYREL) 300 MG tablet Take 300 mg by mouth at bedtime.      Historical Provider, MD  venlafaxine XR (EFFEXOR-XR) 150 MG 24 hr capsule Take 300 mg by mouth 2 (two) times daily.    Historical Provider, MD  Vitamin D, Ergocalciferol, (DRISDOL) 50000  UNITS CAPS Take 50,000 Units by mouth every 7 (seven) days. Take on fridays     Historical Provider, MD   BP 103/75  Pulse 80  Temp(Src) 98.9 F (37.2 C) (Oral)  Resp 14  Ht 5\' 3"  (1.6 m)  Wt 130 lb (58.968 kg)  BMI 23.03 kg/m2  SpO2 96%  LMP 12/04/2010 Physical Exam  Nursing note and vitals reviewed. Constitutional: She is oriented to person, place, and time. She appears well-developed and well-nourished. No distress.  HENT:  Head: Atraumatic.  Eyes: Conjunctivae and EOM are normal. Pupils are equal, round, and reactive to light.  Neck: Neck supple.  No nuchal rigidity  Neurological: She is alert and oriented to person, place, and time. She has normal strength. No cranial nerve deficit. She displays a negative Romberg sign. Coordination and gait normal. GCS eye subscore is 4. GCS verbal subscore is 5. GCS motor subscore is 6.  Skin: No rash noted.  Psychiatric: She has a normal mood and affect.    ED Course  Procedures (including critical care time)  Patient with acute on chronic return migraine without aura.  Headache similar to previous, no fever, neck stiffness, neuro findings or new symptoms to suggest more serious etiology.  I don't think SAH, ICH, meningitis, encephalitis, mass at this time.  No recent trauma.  I don't feel imaging necessary at this time.  Plan to control symptoms.   Labs Review Labs Reviewed - No data to display  Imaging Review No results found.   EKG Interpretation None      MDM   Final diagnoses:  Nonintractable migraine, unspecified migraine type    BP 106/67  Pulse 69  Temp(Src) 98.9 F (37.2 C) (Oral)  Resp 14  Ht 5\' 3"  (1.6 m)  Wt 130 lb (58.968 kg)  BMI 23.03 kg/m2  SpO2 94%  LMP 12/04/2010     Fayrene HelperBowie Christella App, PA-C 08/25/13 2221

## 2013-08-25 NOTE — ED Notes (Addendum)
Pt. reports migraine headache onset yesterday morning with nausea , vomitting , photophobia and sensitivity to loud noise.

## 2013-08-26 NOTE — ED Provider Notes (Signed)
Medical screening examination/treatment/procedure(s) were performed by non-physician practitioner and as supervising physician I was immediately available for consultation/collaboration.   EKG Interpretation None        Richardean Canalavid H Dequon Schnebly, MD 08/26/13 1705

## 2013-09-15 ENCOUNTER — Encounter (HOSPITAL_COMMUNITY): Payer: Self-pay | Admitting: Emergency Medicine

## 2013-09-15 ENCOUNTER — Emergency Department (HOSPITAL_COMMUNITY)
Admission: EM | Admit: 2013-09-15 | Discharge: 2013-09-15 | Disposition: A | Discharge status: Home / Self Care | Payer: Medicare Other | Attending: Emergency Medicine | Admitting: Emergency Medicine

## 2013-09-15 DIAGNOSIS — F329 Major depressive disorder, single episode, unspecified: Secondary | ICD-10-CM | POA: Diagnosis not present

## 2013-09-15 DIAGNOSIS — Z880 Allergy status to penicillin: Secondary | ICD-10-CM | POA: Insufficient documentation

## 2013-09-15 DIAGNOSIS — E039 Hypothyroidism, unspecified: Secondary | ICD-10-CM | POA: Insufficient documentation

## 2013-09-15 DIAGNOSIS — F17200 Nicotine dependence, unspecified, uncomplicated: Secondary | ICD-10-CM | POA: Insufficient documentation

## 2013-09-15 DIAGNOSIS — G43909 Migraine, unspecified, not intractable, without status migrainosus: Secondary | ICD-10-CM | POA: Insufficient documentation

## 2013-09-15 DIAGNOSIS — F3289 Other specified depressive episodes: Secondary | ICD-10-CM | POA: Insufficient documentation

## 2013-09-15 DIAGNOSIS — Z79899 Other long term (current) drug therapy: Secondary | ICD-10-CM | POA: Insufficient documentation

## 2013-09-15 DIAGNOSIS — G43809 Other migraine, not intractable, without status migrainosus: Secondary | ICD-10-CM | POA: Diagnosis not present

## 2013-09-15 DIAGNOSIS — F909 Attention-deficit hyperactivity disorder, unspecified type: Secondary | ICD-10-CM | POA: Insufficient documentation

## 2013-09-15 DIAGNOSIS — F4310 Post-traumatic stress disorder, unspecified: Secondary | ICD-10-CM | POA: Insufficient documentation

## 2013-09-15 MED ORDER — MORPHINE SULFATE 4 MG/ML IJ SOLN
4.0000 mg | Freq: Once | INTRAMUSCULAR | Status: AC
  Administered 2013-09-15: 4 mg via INTRAVENOUS
  Filled 2013-09-15: qty 1

## 2013-09-15 MED ORDER — PROMETHAZINE HCL 25 MG/ML IJ SOLN
25.0000 mg | Freq: Once | INTRAMUSCULAR | Status: AC
  Administered 2013-09-15: 25 mg via INTRAVENOUS
  Filled 2013-09-15 (×2): qty 1

## 2013-09-15 MED ORDER — HYDROMORPHONE HCL PF 1 MG/ML IJ SOLN
1.0000 mg | Freq: Once | INTRAMUSCULAR | Status: DC
Start: 2013-09-15 — End: 2013-09-15

## 2013-09-15 MED ORDER — HYDROMORPHONE HCL PF 1 MG/ML IJ SOLN
1.0000 mg | Freq: Once | INTRAMUSCULAR | Status: AC
  Administered 2013-09-15: 1 mg via INTRAVENOUS
  Filled 2013-09-15: qty 1

## 2013-09-15 MED ORDER — DIPHENHYDRAMINE HCL 50 MG/ML IJ SOLN
25.0000 mg | Freq: Once | INTRAMUSCULAR | Status: AC
  Administered 2013-09-15: 25 mg via INTRAVENOUS
  Filled 2013-09-15: qty 1

## 2013-09-15 MED ORDER — SODIUM CHLORIDE 0.9 % IV BOLUS (SEPSIS)
1000.0000 mL | Freq: Once | INTRAVENOUS | Status: AC
  Administered 2013-09-15: 1000 mL via INTRAVENOUS

## 2013-09-15 NOTE — ED Provider Notes (Signed)
CSN: [id number]     Arrival date & time 09/15/13  1608 History  This chart was scribed for non-physician practitioner, Marlon Pel, PA-C,working with [name], MD, by Karle Plumber, ED Scribe. This patient was seen in room C25C/C25C and the patient's care was started at 5:32 PM.  Chief Complaint  Patient presents with  . Migraine   Patient is a 48 y.o. female presenting with migraines. The history is provided by the patient. No language interpreter was used.  Migraine Associated symptoms include headaches.   HPI Comments:  [name] is a 48 y.o. female with PMH of migraines who presents to the Emergency Department complaining of a migraine that began last night. She reports associated nausea, vomiting, photophobia and sound sensitivity. She states she had two regular bowel movements today and two episodes of watery diarrhea which she states she does get with her migraines sometimes. She states the pain is a severe pain like her head "is in a vice" and is typical of her other migraines and radiates into her neck. She states she saw her PCP about four days ago and cannot get her normal migraine medications for another 13 days because she took too much and ran out early. She denies numbness, visual changes, weakness of the extremities, fevers or fatigue.  Past Medical History  Diagnosis Date  . Migraine   . PTSD (post-traumatic stress disorder)   . Anxiety   . Personality disorder   . Depression   . High cholesterol   . Hypothyroid   . ADHD (attention deficit hyperactivity disorder)    Past Surgical History  Procedure Laterality Date  . Cholecystectomy    . Mandible fracture surgery     No family history on file. History  Substance Use Topics  . Smoking status: Current Every Day Smoker -- 1.00 packs/day    Types: Cigarettes  . Smokeless tobacco: Not on file  . Alcohol Use: No   OB History   Grav Para Term Preterm Abortions TAB SAB Ect Mult Living                  Review of Systems  Constitutional: Negative for fever and fatigue.  Eyes: Positive for photophobia. Negative for visual disturbance.  Neurological: Positive for headaches. Negative for weakness and numbness.  All other systems reviewed and are negative.   Allergies  Compazine; Geodon; Imitrex; Metoclopramide hcl; Other; Penicillins; Toradol; Wellbutrin; and Zofran  Home Medications   Prior to Admission medications   Medication Sig Start Date End Date Taking? Authorizing Provider  amphetamine-dextroamphetamine (ADDERALL) 10 MG tablet Take 10 mg by mouth 2 (two) times daily.   Yes Historical Provider, MD  busPIRone (BUSPAR) 10 MG tablet Take 10 mg by mouth 2 (two) times daily.   Yes Historical Provider, MD  diazepam (VALIUM) 10 MG tablet Take 10 mg by mouth 4 (four) times daily.    Yes Historical Provider, MD  haloperidol (HALDOL) 2 MG tablet Take 2 mg by mouth 2 (two) times daily.   Yes Historical Provider, MD  levothyroxine (SYNTHROID, LEVOTHROID) 125 MCG tablet Take 125 mcg by mouth daily before breakfast.   Yes Historical Provider, MD  lithium carbonate 300 MG capsule Take 300 mg by mouth 2 (two) times daily with a meal.   Yes Historical Provider, MD  oxymorphone (OPANA ER) 15 MG 12 hr tablet Take 15 mg by mouth every 12 (twelve) hours.   Yes Historical Provider, MD  prazosin (MINIPRESS) 1 MG capsule Take 2 mg  by mouth at bedtime.   Yes Historical Provider, MD  promethazine (PHENERGAN) 25 MG suppository Place 25 mg rectally every 6 (six) hours as needed for nausea or vomiting.   Yes Historical Provider, MD  trazodone (DESYREL) 300 MG tablet Take 300 mg by mouth at bedtime.     Yes Historical Provider, MD  venlafaxine XR (EFFEXOR-XR) 150 MG 24 hr capsule Take 150 mg by mouth 2 (two) times daily.    Yes Historical Provider, MD   Triage Vitals: BP 115/72  Pulse 73  Temp(Src) 98.3 F (36.8 C) (Oral)  Resp 20  Ht  (1.6 m)  Wt 130 lb (58.968 kg)  BMI 23.03 kg/m2  SpO2 96%  LMP  12/04/2010 Physical Exam  Nursing note and vitals reviewed. Constitutional: She is oriented to person, place, and time. She appears well-developed and well-nourished. No distress.  HENT:  Head: Normocephalic and atraumatic.  Eyes: Conjunctivae and EOM are normal. Pupils are equal, round, and reactive to light.  Neck: Normal range of motion. Muscular tenderness present. No spinous process tenderness present. No edema, no erythema and normal range of motion present.  Cardiovascular: Normal rate.   Pulmonary/Chest: Effort normal.  Musculoskeletal: Normal range of motion.  Neurological: She is alert and oriented to person, place, and time. She has normal strength. No cranial nerve deficit or sensory deficit. GCS eye subscore is 4. GCS verbal subscore is 5. GCS motor subscore is 6.  Skin: Skin is warm and dry.  Psychiatric: She has a normal mood and affect. Her behavior is normal. Thought content normal.    ED Course  Procedures (including critical care time) DIAGNOSTIC STUDIES: Oxygen Saturation is 96% on RA, normal by my interpretation.   COORDINATION OF CARE: 5:38 PM- Will order IV fluids with IV promethazine and Benadryl. Pt verbalizes understanding and agrees to plan.  7:36 PM- Pt states her pain is an 8 and states this migraine is really bad. Informed pt that the next medication we could try is Fentanyl and she reports Fentanyl does not work for her. She requests Morphine stating that she usually takes 10 mg and states that usually works well and she likes the way it makes her fell. She follows that statement with "I don't mean that in a drug user way".   Medications  morphine 4 MG/ML injection 4 mg (not administered)  sodium chloride 0.9 % bolus 1,000 mL (0 mLs Intravenous Stopped 09/15/13 1841)  promethazine (PHENERGAN) injection 25 mg (25 mg Intravenous Given 09/15/13 1836)  diphenhydrAMINE (BENADRYL) injection 25 mg (25 mg Intravenous Given 09/15/13 1834)  HYDROmorphone (DILAUDID)  injection 1 mg (1 mg Intravenous Given 09/15/13 1916)    Labs Review Labs Reviewed - No data to display  Imaging Review No results found.   EKG Interpretation None      MDM   Final diagnoses:  Other migraine without status migrainosus, not intractable   6:15 pm Patient with headache and neck pain, says this is like her typical migraine. The patient does not look sick, nor appear to be in severe pain. Normal vital signs and no systemic symptoms. Her symptoms would be very atypical for [facility], meningitis, stroke, encephalitis   Will treat pain and re-eval.  7: 24 pm The patients been treated with  Medications  morphine 4 MG/ML injection 4 mg (not administered)  sodium chloride 0.9 % bolus 1,000 mL (0 mLs Intravenous Stopped 09/15/13 1841)  promethazine (PHENERGAN) injection 25 mg (25 mg Intravenous Given 09/15/13 1836)  diphenhydrAMINE (BENADRYL)  injection 25 mg (25 mg Intravenous Given 09/15/13 1834)  HYDROmorphone (DILAUDID) injection 1 mg (1 mg Intravenous Given 09/15/13 1916)   In the ED. This helped improve her pain to 3/10. Unfortunately could not receive Imitrex, Reglan or compazine due to allergies. She has normal vital signs. I will not write for any pain medications since she gets them from her PCP and he would not write them for her.  48 y.o.Caleen Jobs Naeve's  with back pain. No neurological deficits and normal neuro exam. Patient can walk. No loss of bowel or bladder control. No concern for cauda equina at this time base on HPI and physical exam findings. No fever, night sweats, weight loss, h/o cancer, IVDU.   Patient Plan 1. Treatment: rest and drink plenty of fluids 2. Follow Up: Please followup with your primary doctor for discussion of your diagnoses and further evaluation after today's visit; if you do not have a primary care doctor use the resource guide provided to find one  09/15/13 1633 98.3 F (36.8 C) 73 20 115/72 mmHg 96 % -- 130 lb (58.968 kg)  EJS  Patient/guardian has voiced understanding and agreed to follow-up with the PCP or specialist.  I personally performed the services described in this documentation, which was scribed in my presence. The recorded information has been reviewed and is accurate.    Dorthula Matas, PA-C 09/15/13 1942

## 2013-09-15 NOTE — ED Notes (Signed)
Pt reports migraine since last night with nvd which is typical for her. Pt also c/o light and sound sensitivity. Pt ran out of medications at home to take.

## 2013-09-15 NOTE — Discharge Instructions (Signed)

## 2013-09-18 NOTE — ED Provider Notes (Signed)
Medical screening examination/treatment/procedure(s) were performed by non-physician practitioner and as supervising physician I was immediately available for consultation/collaboration.   EKG Interpretation None       Cythina Mickelsen, MD 09/18/13 1553 

## 2013-09-20 ENCOUNTER — Emergency Department (HOSPITAL_COMMUNITY)
Admission: EM | Admit: 2013-09-20 | Discharge: 2013-09-20 | Disposition: A | Discharge status: Home / Self Care | Payer: Medicare Other | Attending: Emergency Medicine | Admitting: Emergency Medicine

## 2013-09-20 ENCOUNTER — Encounter (HOSPITAL_COMMUNITY): Payer: Self-pay | Admitting: Emergency Medicine

## 2013-09-20 DIAGNOSIS — F4310 Post-traumatic stress disorder, unspecified: Secondary | ICD-10-CM | POA: Insufficient documentation

## 2013-09-20 DIAGNOSIS — F411 Generalized anxiety disorder: Secondary | ICD-10-CM | POA: Insufficient documentation

## 2013-09-20 DIAGNOSIS — F3289 Other specified depressive episodes: Secondary | ICD-10-CM | POA: Diagnosis not present

## 2013-09-20 DIAGNOSIS — Z880 Allergy status to penicillin: Secondary | ICD-10-CM | POA: Insufficient documentation

## 2013-09-20 DIAGNOSIS — F329 Major depressive disorder, single episode, unspecified: Secondary | ICD-10-CM | POA: Diagnosis not present

## 2013-09-20 DIAGNOSIS — Z79899 Other long term (current) drug therapy: Secondary | ICD-10-CM | POA: Insufficient documentation

## 2013-09-20 DIAGNOSIS — E039 Hypothyroidism, unspecified: Secondary | ICD-10-CM | POA: Insufficient documentation

## 2013-09-20 DIAGNOSIS — F17200 Nicotine dependence, unspecified, uncomplicated: Secondary | ICD-10-CM | POA: Diagnosis not present

## 2013-09-20 DIAGNOSIS — G43909 Migraine, unspecified, not intractable, without status migrainosus: Secondary | ICD-10-CM | POA: Insufficient documentation

## 2013-09-20 DIAGNOSIS — G43809 Other migraine, not intractable, without status migrainosus: Secondary | ICD-10-CM

## 2013-09-20 DIAGNOSIS — F909 Attention-deficit hyperactivity disorder, unspecified type: Secondary | ICD-10-CM | POA: Insufficient documentation

## 2013-09-20 MED ORDER — HYDROMORPHONE HCL PF 1 MG/ML IJ SOLN
1.0000 mg | Freq: Once | INTRAMUSCULAR | Status: AC
  Administered 2013-09-20: 1 mg via INTRAVENOUS
  Filled 2013-09-20: qty 1

## 2013-09-20 MED ORDER — DIPHENHYDRAMINE HCL 50 MG/ML IJ SOLN
25.0000 mg | Freq: Once | INTRAMUSCULAR | Status: AC
  Administered 2013-09-20: 25 mg via INTRAVENOUS
  Filled 2013-09-20: qty 1

## 2013-09-20 MED ORDER — HYDROMORPHONE HCL PF 1 MG/ML IJ SOLN
1.0000 mg | Freq: Once | INTRAMUSCULAR | Status: AC
Start: 2013-09-20 — End: 2013-09-20
  Administered 2013-09-20: 1 mg via INTRAVENOUS
  Filled 2013-09-20: qty 1

## 2013-09-20 MED ORDER — SODIUM CHLORIDE 0.9 % IV BOLUS (SEPSIS)
1000.0000 mL | Freq: Once | INTRAVENOUS | Status: AC
  Administered 2013-09-20: 1000 mL via INTRAVENOUS

## 2013-09-20 MED ORDER — PROMETHAZINE HCL 25 MG/ML IJ SOLN
25.0000 mg | Freq: Once | INTRAMUSCULAR | Status: AC
  Administered 2013-09-20: 25 mg via INTRAVENOUS
  Filled 2013-09-20: qty 1

## 2013-09-20 NOTE — ED Provider Notes (Signed)
CSN: [id number]     Arrival date & time 09/20/13  1641 History   First MD Initiated Contact with Patient 09/20/13 1758     Chief Complaint  Patient presents with  . Migraine     (Consider location/radiation/quality/duration/timing/severity/associated sxs/prior Treatment) HPI Comments: Patient is a 48 year old female with history of migraines, PTSD, personality disorder, high cholesterol, hypothyroid, ADHD who presents to the emergency department today with migraine. She has had a migraine for the past day and a half. It has been gradually worsening. She describes the migraine has had "my head turning in a vice". She has associated nausea, photophobia, phonophobia. She denies blurry vision or double vision to me. Reports that her headache is typical for her migraines. She has an upcoming appointment with her primary care provider to discuss better preventative and breakthrough medications. She currently takes Opana for her migraines.   Patient is a 48 y.o. female presenting with migraines. The history is provided by the patient. No language interpreter was used.  Migraine Associated symptoms include headaches and nausea. Pertinent negatives include no abdominal pain, chest pain, neck pain or vomiting.    Past Medical History  Diagnosis Date  . Migraine   . PTSD (post-traumatic stress disorder)   . Anxiety   . Personality disorder   . Depression   . High cholesterol   . Hypothyroid   . ADHD (attention deficit hyperactivity disorder)    Past Surgical History  Procedure Laterality Date  . Cholecystectomy    . Mandible fracture surgery     History reviewed. No pertinent family history. History  Substance Use Topics  . Smoking status: Current Every Day Smoker -- 1.00 packs/day    Types: Cigarettes  . Smokeless tobacco: Not on file  . Alcohol Use: No   OB History   Grav Para Term Preterm Abortions TAB SAB Ect Mult Living                 Review of Systems  Eyes: Positive for  photophobia. Negative for visual disturbance.  Respiratory: Negative for shortness of breath.   Cardiovascular: Negative for chest pain.  Gastrointestinal: Positive for nausea. Negative for vomiting and abdominal pain.  Musculoskeletal: Negative for neck pain.  Neurological: Positive for headaches.  All other systems reviewed and are negative.     Allergies  Compazine; Geodon; Imitrex; Metoclopramide hcl; Other; Penicillins; Toradol; Wellbutrin; and Zofran  Home Medications   Prior to Admission medications   Medication Sig Start Date End Date Taking? Authorizing Provider  amphetamine-dextroamphetamine (ADDERALL) 10 MG tablet Take 10 mg by mouth 2 (two) times daily.    Historical Provider, MD  busPIRone (BUSPAR) 10 MG tablet Take 10 mg by mouth 2 (two) times daily.    Historical Provider, MD  diazepam (VALIUM) 10 MG tablet Take 10 mg by mouth 4 (four) times daily.     Historical Provider, MD  haloperidol (HALDOL) 2 MG tablet Take 2 mg by mouth 2 (two) times daily.    Historical Provider, MD  levothyroxine (SYNTHROID, LEVOTHROID) 125 MCG tablet Take 125 mcg by mouth daily before breakfast.    Historical Provider, MD  lithium carbonate 300 MG capsule Take 300 mg by mouth 2 (two) times daily with a meal.    Historical Provider, MD  oxymorphone (OPANA ER) 15 MG 12 hr tablet Take 15 mg by mouth every 12 (twelve) hours.    Historical Provider, MD  prazosin (MINIPRESS) 1 MG capsule Take 2 mg by mouth at bedtime.  Historical Provider, MD  promethazine (PHENERGAN) 25 MG suppository Place 25 mg rectally every 6 (six) hours as needed for nausea or vomiting.    Historical Provider, MD  trazodone (DESYREL) 300 MG tablet Take 300 mg by mouth at bedtime.      Historical Provider, MD  venlafaxine XR (EFFEXOR-XR) 150 MG 24 hr capsule Take 150 mg by mouth 2 (two) times daily.     Historical Provider, MD   BP 138/78  Pulse 72  Temp(Src) 99.5 F (37.5 C)  Resp 20  SpO2 95%  LMP 12/04/2010 Physical  Exam  Nursing note and vitals reviewed. Constitutional: She is oriented to person, place, and time. She appears well-developed and well-nourished. No distress.  Patient with sunglasses on  HENT:  Head: Normocephalic and atraumatic.  Right Ear: External ear normal.  Left Ear: External ear normal.  Nose: Nose normal.  Mouth/Throat: Oropharynx is clear and moist.  Eyes: Conjunctivae and EOM are normal. Pupils are equal, round, and reactive to light.  Neck: Normal range of motion. No rigidity.  No nuchal rigidity or meningeal signs  Cardiovascular: Normal rate, regular rhythm, normal heart sounds, intact distal pulses and normal pulses.   Pulses:      Radial pulses are 2+ on the right side, and 2+ on the left side.       Posterior tibial pulses are 2+ on the right side, and 2+ on the left side.  Pulmonary/Chest: Effort normal and breath sounds normal. No stridor. No respiratory distress. She has no wheezes. She has no rales.  Abdominal: Soft. She exhibits no distension.  Musculoskeletal: Normal range of motion.  Moves all extremities without guarding ataxia  Neurological: She is alert and oriented to person, place, and time. She has normal strength. No sensory deficit. Coordination normal. GCS eye subscore is 4. GCS verbal subscore is 5. GCS motor subscore is 6.  Reflex Scores:      Patellar reflexes are 2+ on the right side and 2+ on the left side. Finger-nose-finger normal, rapid alternating movements normal, grip strength 5 out of 5 bilaterally Strength 5 out of 5 in all extremities.  Skin: Skin is warm and dry. She is not diaphoretic. No erythema.  Psychiatric: She has a normal mood and affect. Her behavior is normal.    ED Course  Procedures (including critical care time) Labs Review Labs Reviewed - No data to display  Imaging Review No results found.   EKG Interpretation None      MDM   Final diagnoses:  Other migraine without status migrainosus, not intractable   Pt  HA treated and improved while in ED.  Presentation is like pts typical HA and non concerning for [facility], ICH, Meningitis, or temporal arteritis. Pt is afebrile with no focal neuro deficits, nuchal rigidity, or change in vision. Pt is to follow up with PCP to discuss prophylactic medication. Pt verbalizes understanding and is agreeable with plan to dc.    Mora Bellman, PA-C 09/20/13 2105

## 2013-09-20 NOTE — ED Notes (Signed)
Presents with migraine associated with nausea, blurred vision and sensitivity to light and sound. Typical for patient. She is alert and oriented.

## 2013-09-20 NOTE — Discharge Instructions (Signed)
Headaches, Frequently Asked Questions  MIGRAINE HEADACHES  Q: What is migraine? What causes it? How can I treat it?  A: Generally, migraine headaches begin as a dull ache. Then they develop into a constant, throbbing, and pulsating pain. You may experience pain at the temples. You may experience pain at the front or back of one or both sides of the head. The pain is usually accompanied by a combination of:  · Nausea.  · Vomiting.  · Sensitivity to light and noise.  Some people (about 15%) experience an aura (see below) before an attack. The cause of migraine is believed to be chemical reactions in the brain. Treatment for migraine may include over-the-counter or prescription medications. It may also include self-help techniques. These include relaxation training and biofeedback.   Q: What is an aura?  A: About 15% of people with migraine get an "aura". This is a sign of neurological symptoms that occur before a migraine headache. You may see wavy or jagged lines, dots, or flashing lights. You might experience tunnel vision or blind spots in one or both eyes. The aura can include visual or auditory hallucinations (something imagined). It may include disruptions in smell (such as strange odors), taste or touch. Other symptoms include:  · Numbness.  · A "pins and needles" sensation.  · Difficulty in recalling or speaking the correct word.  These neurological events may last as long as 60 minutes. These symptoms will fade as the headache begins.  Q: What is a trigger?  A: Certain physical or environmental factors can lead to or "trigger" a migraine. These include:  · Foods.  · Hormonal changes.  · Weather.  · Stress.  It is important to remember that triggers are different for everyone. To help prevent migraine attacks, you need to figure out which triggers affect you. Keep a headache diary. This is a good way to track triggers. The diary will help you talk to your healthcare professional about your condition.  Q: Does  weather affect migraines?  A: Bright sunshine, hot, humid conditions, and drastic changes in barometric pressure may lead to, or "trigger," a migraine attack in some people. But studies have shown that weather does not act as a trigger for everyone with migraines.  Q: What is the link between migraine and hormones?  A: Hormones start and regulate many of your body's functions. Hormones keep your body in balance within a constantly changing environment. The levels of hormones in your body are unbalanced at times. Examples are during menstruation, pregnancy, or menopause. That can lead to a migraine attack. In fact, about three quarters of all women with migraine report that their attacks are related to the menstrual cycle.   Q: Is there an increased risk of stroke for migraine sufferers?  A: The likelihood of a migraine attack causing a stroke is very remote. That is not to say that migraine sufferers cannot have a stroke associated with their migraines. In persons under age 40, the most common associated factor for stroke is migraine headache. But over the course of a person's normal life span, the occurrence of migraine headache may actually be associated with a reduced risk of dying from cerebrovascular disease due to stroke.   Q: What are acute medications for migraine?  A: Acute medications are used to treat the pain of the headache after it has started. Examples over-the-counter medications, NSAIDs, ergots, and triptans.   Q: What are the triptans?  A: Triptans are the newest class of   abortive medications. They are specifically targeted to treat migraine. Triptans are vasoconstrictors. They moderate some chemical reactions in the brain. The triptans work on receptors in your brain. Triptans help to restore the balance of a neurotransmitter called serotonin. Fluctuations in levels of serotonin are thought to be a main cause of migraine.   Q: Are over-the-counter medications for migraine effective?  A:  Over-the-counter, or "OTC," medications may be effective in relieving mild to moderate pain and associated symptoms of migraine. But you should see your caregiver before beginning any treatment regimen for migraine.   Q: What are preventive medications for migraine?  A: Preventive medications for migraine are sometimes referred to as "prophylactic" treatments. They are used to reduce the frequency, severity, and length of migraine attacks. Examples of preventive medications include antiepileptic medications, antidepressants, beta-blockers, calcium channel blockers, and NSAIDs (nonsteroidal anti-inflammatory drugs).  Q: Why are anticonvulsants used to treat migraine?  A: During the past few years, there has been an increased interest in antiepileptic drugs for the prevention of migraine. They are sometimes referred to as "anticonvulsants". Both epilepsy and migraine may be caused by similar reactions in the brain.   Q: Why are antidepressants used to treat migraine?  A: Antidepressants are typically used to treat people with depression. They may reduce migraine frequency by regulating chemical levels, such as serotonin, in the brain.   Q: What alternative therapies are used to treat migraine?  A: The term "alternative therapies" is often used to describe treatments considered outside the scope of conventional Western medicine. Examples of alternative therapy include acupuncture, acupressure, and yoga. Another common alternative treatment is herbal therapy. Some herbs are believed to relieve headache pain. Always discuss alternative therapies with your caregiver before proceeding. Some herbal products contain arsenic and other toxins.  TENSION HEADACHES  Q: What is a tension-type headache? What causes it? How can I treat it?  A: Tension-type headaches occur randomly. They are often the result of temporary stress, anxiety, fatigue, or anger. Symptoms include soreness in your temples, a tightening band-like sensation  around your head (a "vice-like" ache). Symptoms can also include a pulling feeling, pressure sensations, and contracting head and neck muscles. The headache begins in your forehead, temples, or the back of your head and neck. Treatment for tension-type headache may include over-the-counter or prescription medications. Treatment may also include self-help techniques such as relaxation training and biofeedback.  CLUSTER HEADACHES  Q: What is a cluster headache? What causes it? How can I treat it?  A: Cluster headache gets its name because the attacks come in groups. The pain arrives with little, if any, warning. It is usually on one side of the head. A tearing or bloodshot eye and a runny nose on the same side of the headache may also accompany the pain. Cluster headaches are believed to be caused by chemical reactions in the brain. They have been described as the most severe and intense of any headache type. Treatment for cluster headache includes prescription medication and oxygen.  SINUS HEADACHES  Q: What is a sinus headache? What causes it? How can I treat it?  A: When a cavity in the bones of the face and skull (a sinus) becomes inflamed, the inflammation will cause localized pain. This condition is usually the result of an allergic reaction, a tumor, or an infection. If your headache is caused by a sinus blockage, such as an infection, you will probably have a fever. An x-ray will confirm a sinus blockage. Your caregiver's   treatment might include antibiotics for the infection, as well as antihistamines or decongestants.   REBOUND HEADACHES  Q: What is a rebound headache? What causes it? How can I treat it?  A: A pattern of taking acute headache medications too often can lead to a condition known as "rebound headache." A pattern of taking too much headache medication includes taking it more than 2 days per week or in excessive amounts. That means more than the label or a caregiver advises. With rebound  headaches, your medications not only stop relieving pain, they actually begin to cause headaches. Doctors treat rebound headache by tapering the medication that is being overused. Sometimes your caregiver will gradually substitute a different type of treatment or medication. Stopping may be a challenge. Regularly overusing a medication increases the potential for serious side effects. Consult a caregiver if you regularly use headache medications more than 2 days per week or more than the label advises.  ADDITIONAL QUESTIONS AND ANSWERS  Q: What is biofeedback?  A: Biofeedback is a self-help treatment. Biofeedback uses special equipment to monitor your body's involuntary physical responses. Biofeedback monitors:  · Breathing.  · Pulse.  · Heart rate.  · Temperature.  · Muscle tension.  · Brain activity.  Biofeedback helps you refine and perfect your relaxation exercises. You learn to control the physical responses that are related to stress. Once the technique has been mastered, you do not need the equipment any more.  Q: Are headaches hereditary?  A: Four out of five (80%) of people that suffer report a family history of migraine. Scientists are not sure if this is genetic or a family predisposition. Despite the uncertainty, a child has a 50% chance of having migraine if one parent suffers. The child has a 75% chance if both parents suffer.   Q: Can children get headaches?  A: By the time they reach high school, most young people have experienced some type of headache. Many safe and effective approaches or medications can prevent a headache from occurring or stop it after it has begun.   Q: What type of doctor should I see to diagnose and treat my headache?  A: Start with your primary caregiver. Discuss his or her experience and approach to headaches. Discuss methods of classification, diagnosis, and treatment. Your caregiver may decide to recommend you to a headache specialist, depending upon your symptoms or other  physical conditions. Having diabetes, allergies, etc., may require a more comprehensive and inclusive approach to your headache. The National Head[employer] Foundation will provide, upon request, a list of NHF physician members in your state.  Document Released: 03/27/2003 Document Revised: 03/29/2011 Document Reviewed: 09/04/2007  ExitCare® Patient Information ©2015 [employer]. This information is not intended to replace advice given to you by your health care provider. Make sure you discuss any questions you have with your health care provider.      Migraine Headache  A migraine headache is very bad, throbbing pain on one or both sides of your head. Talk to your doctor about what things may bring on (trigger) your migraine headaches.  HOME CARE  · Only take medicines as told by your doctor.  · Lie down in a dark, quiet room when you have a migraine.  · Keep a journal to find out if certain things bring on migraine headaches. For example, write down:  ¨ What you eat and drink.  ¨ How much sleep you get.  ¨ Any change to your diet or medicines.  ·   Lessen how much alcohol you drink.  · Quit smoking if you smoke.  · Get enough sleep.  · Lessen any stress in your life.  · Keep lights dim if bright lights bother you or make your migraines worse.  GET HELP RIGHT AWAY IF:   · Your migraine becomes really bad.  · You have a fever.  · You have a stiff neck.  · You have trouble seeing.  · Your muscles are weak, or you lose muscle control.  · You lose your balance or have trouble walking.  · You feel like you will pass out (faint), or you pass out.  · You have really bad symptoms that are different than your first symptoms.  MAKE SURE YOU:   · Understand these instructions.  · Will watch your condition.  · Will get help right away if you are not doing well or get worse.  Document Released: 10/14/2007 Document Revised: 03/29/2011 Document Reviewed: 09/11/2012  ExitCare® Patient Information ©2015 [employer]. This information is  not intended to replace advice given to you by your health care provider. Make sure you discuss any questions you have with your health care provider.

## 2013-09-21 ENCOUNTER — Encounter (HOSPITAL_COMMUNITY): Payer: Self-pay | Admitting: Emergency Medicine

## 2013-09-21 ENCOUNTER — Emergency Department (HOSPITAL_COMMUNITY)
Admission: EM | Admit: 2013-09-21 | Discharge: 2013-09-21 | Disposition: A | Discharge status: Home / Self Care | Payer: Medicare Other | Attending: Emergency Medicine | Admitting: Emergency Medicine

## 2013-09-21 DIAGNOSIS — Z880 Allergy status to penicillin: Secondary | ICD-10-CM | POA: Diagnosis not present

## 2013-09-21 DIAGNOSIS — F4310 Post-traumatic stress disorder, unspecified: Secondary | ICD-10-CM | POA: Insufficient documentation

## 2013-09-21 DIAGNOSIS — F3289 Other specified depressive episodes: Secondary | ICD-10-CM | POA: Insufficient documentation

## 2013-09-21 DIAGNOSIS — F329 Major depressive disorder, single episode, unspecified: Secondary | ICD-10-CM | POA: Insufficient documentation

## 2013-09-21 DIAGNOSIS — F411 Generalized anxiety disorder: Secondary | ICD-10-CM | POA: Insufficient documentation

## 2013-09-21 DIAGNOSIS — F609 Personality disorder, unspecified: Secondary | ICD-10-CM | POA: Diagnosis not present

## 2013-09-21 DIAGNOSIS — G43819 Other migraine, intractable, without status migrainosus: Secondary | ICD-10-CM | POA: Diagnosis not present

## 2013-09-21 DIAGNOSIS — F17200 Nicotine dependence, unspecified, uncomplicated: Secondary | ICD-10-CM | POA: Insufficient documentation

## 2013-09-21 DIAGNOSIS — E039 Hypothyroidism, unspecified: Secondary | ICD-10-CM | POA: Insufficient documentation

## 2013-09-21 DIAGNOSIS — Z79899 Other long term (current) drug therapy: Secondary | ICD-10-CM | POA: Insufficient documentation

## 2013-09-21 DIAGNOSIS — G43909 Migraine, unspecified, not intractable, without status migrainosus: Secondary | ICD-10-CM | POA: Diagnosis present

## 2013-09-21 MED ORDER — LORAZEPAM 2 MG/ML IJ SOLN
1.0000 mg | Freq: Once | INTRAMUSCULAR | Status: AC
  Administered 2013-09-21: 1 mg via INTRAVENOUS
  Filled 2013-09-21: qty 1

## 2013-09-21 MED ORDER — PROMETHAZINE HCL 25 MG/ML IJ SOLN
25.0000 mg | Freq: Once | INTRAMUSCULAR | Status: AC
  Administered 2013-09-21: 25 mg via INTRAVENOUS
  Filled 2013-09-21: qty 1

## 2013-09-21 MED ORDER — SODIUM CHLORIDE 0.9 % IV BOLUS (SEPSIS)
1000.0000 mL | Freq: Once | INTRAVENOUS | Status: AC
  Administered 2013-09-21: 1000 mL via INTRAVENOUS

## 2013-09-21 MED ORDER — DIPHENHYDRAMINE HCL 50 MG/ML IJ SOLN
25.0000 mg | Freq: Once | INTRAMUSCULAR | Status: AC
  Administered 2013-09-21: 25 mg via INTRAVENOUS
  Filled 2013-09-21: qty 1

## 2013-09-21 MED ORDER — HYDROMORPHONE HCL PF 1 MG/ML IJ SOLN
2.0000 mg | Freq: Once | INTRAMUSCULAR | Status: AC
  Administered 2013-09-21: 2 mg via INTRAVENOUS
  Filled 2013-09-21: qty 2

## 2013-09-21 MED ORDER — SODIUM CHLORIDE 0.9 % IV SOLN: INTRAVENOUS | Status: DC

## 2013-09-21 NOTE — ED Notes (Signed)
Pt arrives stating she still has migraine. States seen here last night for same. States sees pain management for chronic pain again on the 11th. Pt states she took all of her home meds too soon and now is in pain again. Pt awake, alert, oriented x4, NAD at present. VSS

## 2013-09-21 NOTE — ED Provider Notes (Signed)
CSN: [id number]     Arrival date & time 09/21/13  1423 History   First MD Initiated Contact with Patient 09/21/13 1830     No chief complaint on file.    (Consider location/radiation/quality/duration/timing/severity/associated sxs/prior Treatment) HPI Comments: Patient here complaining of worsening chronic migraine. Seen here for similar symptoms yesterday and given medications which made her pain go to 0. Symptoms returned again today described as a viselike gripping to her temples. Headache is exactly like her prior migraines. She has had nausea and vomiting but no fever or chills. No neck pain. No syncope or near-syncope. No neurological findings. Patient has had photo and phonophobia. Nothing makes them improved.  The history is provided by the patient.    Past Medical History  Diagnosis Date  . Migraine   . PTSD (post-traumatic stress disorder)   . Anxiety   . Personality disorder   . Depression   . High cholesterol   . Hypothyroid   . ADHD (attention deficit hyperactivity disorder)    Past Surgical History  Procedure Laterality Date  . Cholecystectomy    . Mandible fracture surgery     No family history on file. History  Substance Use Topics  . Smoking status: Current Every Day Smoker -- 1.00 packs/day    Types: Cigarettes  . Smokeless tobacco: Not on file  . Alcohol Use: No   OB History   Grav Para Term Preterm Abortions TAB SAB Ect Mult Living                 Review of Systems  All other systems reviewed and are negative.     Allergies  Compazine; Geodon; Imitrex; Metoclopramide hcl; Other; Penicillins; Toradol; Wellbutrin; and Zofran  Home Medications   Prior to Admission medications   Medication Sig Start Date End Date Taking? Authorizing Provider  amphetamine-dextroamphetamine (ADDERALL) 10 MG tablet Take 10 mg by mouth 2 (two) times daily.    Historical Provider, MD  busPIRone (BUSPAR) 10 MG tablet Take 10 mg by mouth 2 (two) times daily.     Historical Provider, MD  diazepam (VALIUM) 10 MG tablet Take 10 mg by mouth 4 (four) times daily.     Historical Provider, MD  haloperidol (HALDOL) 2 MG tablet Take 2 mg by mouth 2 (two) times daily.    Historical Provider, MD  levothyroxine (SYNTHROID, LEVOTHROID) 125 MCG tablet Take 125 mcg by mouth daily before breakfast.    Historical Provider, MD  lithium carbonate 300 MG capsule Take 300 mg by mouth 2 (two) times daily with a meal.    Historical Provider, MD  oxymorphone (OPANA ER) 15 MG 12 hr tablet Take 15 mg by mouth every 12 (twelve) hours.    Historical Provider, MD  prazosin (MINIPRESS) 1 MG capsule Take 2 mg by mouth at bedtime.    Historical Provider, MD  promethazine (PHENERGAN) 25 MG suppository Place 25 mg rectally every 6 (six) hours as needed for nausea or vomiting.    Historical Provider, MD  trazodone (DESYREL) 300 MG tablet Take 300 mg by mouth at bedtime.      Historical Provider, MD  venlafaxine XR (EFFEXOR-XR) 150 MG 24 hr capsule Take 150 mg by mouth 2 (two) times daily.     Historical Provider, MD   BP 102/69  Pulse 80  Temp(Src) 98.7 F (37.1 C) (Oral)  Resp 16  Wt 130 lb (58.968 kg)  SpO2 100%  LMP 12/04/2010 Physical Exam  Nursing note and vitals reviewed. Constitutional: She is  oriented to person, place, and time. She appears well-developed and well-nourished.  Non-toxic appearance. No distress.  HENT:  Head: Normocephalic and atraumatic.  Eyes: Conjunctivae, EOM and lids are normal. Pupils are equal, round, and reactive to light.  Neck: Normal range of motion. Neck supple. No tracheal deviation present. No mass present.  Cardiovascular: Normal rate, regular rhythm and normal heart sounds.  Exam reveals no gallop.   No murmur heard. Pulmonary/Chest: Effort normal and breath sounds normal. No stridor. No respiratory distress. She has no decreased breath sounds. She has no wheezes. She has no rhonchi. She has no rales.  Abdominal: Soft. Normal appearance and  bowel sounds are normal. She exhibits no distension. There is no tenderness. There is no rebound and no CVA tenderness.  Musculoskeletal: Normal range of motion. She exhibits no edema and no tenderness.  Neurological: She is alert and oriented to person, place, and time. She has normal strength. No cranial nerve deficit or sensory deficit. GCS eye subscore is 4. GCS verbal subscore is 5. GCS motor subscore is 6.  Skin: Skin is warm and dry. No abrasion and no rash noted.  Psychiatric: She has a normal mood and affect. Her speech is normal and behavior is normal.    ED Course  Procedures (including critical care time) Labs Review Labs Reviewed - No data to display  Imaging Review No results found.   EKG Interpretation None      MDM   Final diagnoses:  None    Pt given pain meds for headache and feels better, normal neuro, no concern for sah or meningitis stable for d/c    Toy Baker, MD 09/21/13 2015

## 2013-09-21 NOTE — Discharge Instructions (Signed)

## 2013-09-21 NOTE — ED Provider Notes (Signed)
Medical screening examination/treatment/procedure(s) were performed by non-physician practitioner and as supervising physician I was immediately available for consultation/collaboration.   EKG Interpretation None        Glynn Octave, MD 09/21/13 [phone]

## 2013-09-21 NOTE — ED Notes (Signed)
Patient said her mouth was really dry and she wanted something to drink. Offered her ice which pt declined because they're hard on her false teeth, offered her water and pt requested sprite in a cup with ice with no straw. Received okay from nurse in charge and gave pt sprite with ice and instructed her to drink it slowly to make sure it doesn't upset her stomach.

## 2013-10-17 ENCOUNTER — Encounter (HOSPITAL_COMMUNITY): Payer: Self-pay | Admitting: Emergency Medicine

## 2013-10-17 ENCOUNTER — Emergency Department (HOSPITAL_COMMUNITY)
Admission: EM | Admit: 2013-10-17 | Discharge: 2013-10-18 | Disposition: A | Discharge status: Home / Self Care | Payer: Medicare Other | Source: Home / Self Care | Attending: Emergency Medicine | Admitting: Emergency Medicine

## 2013-10-17 DIAGNOSIS — E780 Pure hypercholesterolemia: Secondary | ICD-10-CM

## 2013-10-17 DIAGNOSIS — F331 Major depressive disorder, recurrent, moderate: Secondary | ICD-10-CM | POA: Diagnosis not present

## 2013-10-17 DIAGNOSIS — F4310 Post-traumatic stress disorder, unspecified: Secondary | ICD-10-CM | POA: Insufficient documentation

## 2013-10-17 DIAGNOSIS — F69 Unspecified disorder of adult personality and behavior: Secondary | ICD-10-CM | POA: Insufficient documentation

## 2013-10-17 DIAGNOSIS — Z87828 Personal history of other (healed) physical injury and trauma: Secondary | ICD-10-CM

## 2013-10-17 DIAGNOSIS — R45850 Homicidal ideations: Secondary | ICD-10-CM | POA: Insufficient documentation

## 2013-10-17 DIAGNOSIS — E039 Hypothyroidism, unspecified: Secondary | ICD-10-CM | POA: Insufficient documentation

## 2013-10-17 DIAGNOSIS — F419 Anxiety disorder, unspecified: Secondary | ICD-10-CM

## 2013-10-17 DIAGNOSIS — Z79899 Other long term (current) drug therapy: Secondary | ICD-10-CM | POA: Insufficient documentation

## 2013-10-17 DIAGNOSIS — F909 Attention-deficit hyperactivity disorder, unspecified type: Secondary | ICD-10-CM | POA: Insufficient documentation

## 2013-10-17 DIAGNOSIS — F329 Major depressive disorder, single episode, unspecified: Secondary | ICD-10-CM

## 2013-10-17 DIAGNOSIS — G43909 Migraine, unspecified, not intractable, without status migrainosus: Secondary | ICD-10-CM | POA: Insufficient documentation

## 2013-10-17 DIAGNOSIS — F17210 Nicotine dependence, cigarettes, uncomplicated: Secondary | ICD-10-CM

## 2013-10-17 DIAGNOSIS — F32A Depression, unspecified: Secondary | ICD-10-CM

## 2013-10-17 DIAGNOSIS — R45851 Suicidal ideations: Secondary | ICD-10-CM

## 2013-10-17 DIAGNOSIS — G43101 Migraine with aura, not intractable, with status migrainosus: Secondary | ICD-10-CM | POA: Insufficient documentation

## 2013-10-17 DIAGNOSIS — G43111 Migraine with aura, intractable, with status migrainosus: Secondary | ICD-10-CM

## 2013-10-17 LAB — COMPREHENSIVE METABOLIC PANEL
ALT: 6 U/L (ref 0–35)
AST: 10 U/L (ref 0–37)
Albumin: 3.3 g/dL — ABNORMAL LOW (ref 3.5–5.2)
Alkaline Phosphatase: 85 U/L (ref 39–117)
Anion gap: 12 (ref 5–15)
BUN: 5 mg/dL — ABNORMAL LOW (ref 6–23)
CALCIUM: 9.3 mg/dL (ref 8.4–10.5)
CO2: 22 mEq/L (ref 19–32)
Chloride: 106 mEq/L (ref 96–112)
Creatinine, Ser: 0.72 mg/dL (ref 0.50–1.10)
GFR calc Af Amer: >90 mL/min (ref >90)
GFR calc non Af Amer: >90 mL/min (ref >90)
GLUCOSE: 89 mg/dL (ref 70–99)
Potassium: 3.9 mEq/L (ref 3.7–5.3)
SODIUM: 140 meq/L (ref 137–147)
TOTAL PROTEIN: 6.7 g/dL (ref 6.0–8.3)
Total Bilirubin: 0.3 mg/dL (ref 0.3–1.2)

## 2013-10-17 LAB — CBC
HCT: 39.8 % (ref 36.0–46.0)
HEMOGLOBIN: 13.3 g/dL (ref 12.0–15.0)
MCH: 31.4 pg (ref 26.0–34.0)
MCHC: 33.4 g/dL (ref 30.0–36.0)
MCV: 93.9 fL (ref 78.0–100.0)
Platelets: 209 10*3/uL (ref 150–400)
RBC: 4.24 MIL/uL (ref 3.87–5.11)
RDW: 14.2 % (ref 11.5–15.5)
WBC: 11.4 10*3/uL — ABNORMAL HIGH (ref 4.0–10.5)

## 2013-10-17 LAB — ETHANOL: Alcohol, Ethyl (B): <11 mg/dL (ref 0–11)

## 2013-10-17 LAB — RAPID URINE DRUG SCREEN, HOSP PERFORMED
AMPHETAMINES: NOT DETECTED
Barbiturates: NOT DETECTED
Benzodiazepines: POSITIVE — AB
COCAINE: POSITIVE — AB
OPIATES: NOT DETECTED
Tetrahydrocannabinol: NOT DETECTED

## 2013-10-17 LAB — ACETAMINOPHEN LEVEL

## 2013-10-17 LAB — SALICYLATE LEVEL

## 2013-10-17 MED ORDER — LORAZEPAM 2 MG/ML IJ SOLN
1.0000 mg | Freq: Once | INTRAMUSCULAR | Status: AC
  Administered 2013-10-17: 1 mg via INTRAVENOUS
  Filled 2013-10-17: qty 1

## 2013-10-17 MED ORDER — LORAZEPAM 1 MG PO TABS
1.0000 mg | ORAL_TABLET | Freq: Three times a day (TID) | ORAL | Status: DC | PRN
  Administered 2013-10-18: 1 mg via ORAL
  Filled 2013-10-17: qty 1

## 2013-10-17 MED ORDER — SODIUM CHLORIDE 0.9 % IV BOLUS (SEPSIS)
1000.0000 mL | Freq: Once | INTRAVENOUS | Status: AC
  Administered 2013-10-17: 1000 mL via INTRAVENOUS

## 2013-10-17 MED ORDER — ZOLPIDEM TARTRATE 5 MG PO TABS: 5.0000 mg | ORAL_TABLET | Freq: Every evening | ORAL | Status: DC | PRN

## 2013-10-17 MED ORDER — PROMETHAZINE HCL 25 MG/ML IJ SOLN
25.0000 mg | Freq: Once | INTRAMUSCULAR | Status: AC
  Administered 2013-10-17: 25 mg via INTRAVENOUS
  Filled 2013-10-17 (×2): qty 1

## 2013-10-17 MED ORDER — HYDROMORPHONE HCL 1 MG/ML IJ SOLN
1.0000 mg | Freq: Once | INTRAMUSCULAR | Status: AC
  Administered 2013-10-17: 1 mg via INTRAVENOUS
  Filled 2013-10-17: qty 1

## 2013-10-17 MED ORDER — ACETAMINOPHEN 325 MG PO TABS: 650.0000 mg | ORAL_TABLET | ORAL | Status: DC | PRN

## 2013-10-17 MED ORDER — NICOTINE 21 MG/24HR TD PT24
21.0000 mg | MEDICATED_PATCH | Freq: Every day | TRANSDERMAL | Status: DC
  Administered 2013-10-17: 21 mg via TRANSDERMAL
  Filled 2013-10-17: qty 1

## 2013-10-17 MED ORDER — DIPHENHYDRAMINE HCL 50 MG/ML IJ SOLN
25.0000 mg | Freq: Once | INTRAMUSCULAR | Status: AC
  Administered 2013-10-17: 25 mg via INTRAVENOUS
  Filled 2013-10-17: qty 1

## 2013-10-17 MED ORDER — FENTANYL CITRATE 0.05 MG/ML IJ SOLN
50.0000 ug | Freq: Once | INTRAMUSCULAR | Status: AC
  Administered 2013-10-17: 50 ug via INTRAVENOUS
  Filled 2013-10-17: qty 2

## 2013-10-17 NOTE — ED Notes (Signed)
PT placed on 2LPM o2 via [place]. Per RN.

## 2013-10-17 NOTE — ED Notes (Signed)
Pt has been wanded and called for sitter

## 2013-10-17 NOTE — ED Notes (Signed)
Per pt sts migraine and she is having SI and HI thoughts. Denies plan.

## 2013-10-17 NOTE — ED Notes (Signed)
Cover sitter so she can go on break

## 2013-10-17 NOTE — ED Provider Notes (Signed)
CSN: [id number]     Arrival date & time 10/17/13  1413 History   First MD Initiated Contact with Patient 10/17/13 1552     Chief Complaint  Patient presents with  . Suicidal  . Migraine     (Consider location/radiation/quality/duration/timing/severity/associated sxs/prior Treatment) HPI Pt is a 48yo female with hx of migraine, PTSD, anxiety, personality disorder, depression, high cholesterol, and ADAH presenting to ED with c/o gradual onset "vice like" headache that started yesterday. Feels similar to previous migraines. Pain is constant, aching and throbbing, "like a band around my head" 10/10 at worse. Photophobia and phonophobia present, associated with nausea and vomiting.  No vomiting today. States she takes opana, however, due to increased stress, her headache is worsening as well as causing her to have worsening depression, SI and HI w/o specific plans. Reports remote hx of cutting but denies recent self injury. Denies [place] of etoh or drugs including cocaine or heroine. Denies numbness or tingling. Denies neck pain, fever, chest pain, SOB, or change in balance. Denies head trauma or being on blood thinners.   Past Medical History  Diagnosis Date  . Migraine   . PTSD (post-traumatic stress disorder)   . Anxiety   . Personality disorder   . Depression   . High cholesterol   . Hypothyroid   . ADHD (attention deficit hyperactivity disorder)    Past Surgical History  Procedure Laterality Date  . Cholecystectomy    . Mandible fracture surgery     History reviewed. No pertinent family history. History  Substance Use Topics  . Smoking status: Current Every Day Smoker -- 1.00 packs/day    Types: Cigarettes  . Smokeless tobacco: Not on file  . Alcohol Use: No   OB History   Grav Para Term Preterm Abortions TAB SAB Ect Mult Living                 Review of Systems  Constitutional: Negative for fever and chills.  Respiratory: Negative for cough and shortness of breath.    Cardiovascular: Negative for chest pain and palpitations.  Gastrointestinal: Positive for nausea and vomiting. Negative for abdominal pain.  Musculoskeletal: Negative for neck pain and neck stiffness.  Neurological: Positive for headaches. Negative for dizziness and light-headedness.  Psychiatric/Behavioral: Positive for suicidal ideas and self-injury ( remote hx of cutting). Negative for hallucinations. The patient is nervous/anxious.   All other systems reviewed and are negative.     Allergies  Compazine; Geodon; Imitrex; Metoclopramide hcl; Other; Penicillins; Toradol; Wellbutrin; and Zofran  Home Medications   Prior to Admission medications   Medication Sig Start Date End Date Taking? Authorizing Provider  amphetamine-dextroamphetamine (ADDERALL) 10 MG tablet Take 10 mg by mouth 2 (two) times daily.   Yes Historical Provider, MD  busPIRone (BUSPAR) 10 MG tablet Take 10 mg by mouth 2 (two) times daily.   Yes Historical Provider, MD  diazepam (VALIUM) 10 MG tablet Take 10 mg by mouth 4 (four) times daily.    Yes Historical Provider, MD  haloperidol (HALDOL) 2 MG tablet Take 2 mg by mouth 2 (two) times daily.   Yes Historical Provider, MD  levothyroxine (SYNTHROID, LEVOTHROID) 125 MCG tablet Take 125 mcg by mouth daily before breakfast.   Yes Historical Provider, MD  lithium carbonate 300 MG capsule Take 300 mg by mouth 2 (two) times daily with a meal.   Yes Historical Provider, MD  oxymorphone (OPANA ER) 15 MG 12 hr tablet Take 15 mg by mouth every 12 (twelve) hours.  Yes Historical Provider, MD  promethazine (PHENERGAN) 25 MG suppository Place 25 mg rectally every 6 (six) hours as needed for nausea or vomiting.   Yes Historical Provider, MD  trazodone (DESYREL) 300 MG tablet Take 300 mg by mouth at bedtime.     Yes Historical Provider, MD  venlafaxine XR (EFFEXOR-XR) 150 MG 24 hr capsule Take 150 mg by mouth 2 (two) times daily.    Yes Historical Provider, MD   BP 115/68  Pulse 60   Temp(Src) 98.3 F (36.8 C)  Resp 17  SpO2 99%  LMP 12/04/2010 Physical Exam  Nursing note and vitals reviewed. Constitutional: She is oriented to person, place, and time. She appears well-developed and well-nourished.  Pt lying on exam bed in darkened room with sunglasses on.  HENT:  Head: Normocephalic and atraumatic.  Eyes: Conjunctivae are normal. No scleral icterus.  Neck: Normal range of motion. Neck supple.  No nuchal rigidity or meningeal signs.  Cardiovascular: Normal rate, regular rhythm and normal heart sounds.   Pulmonary/Chest: Effort normal and breath sounds normal. No respiratory distress. She has no wheezes. She has no rales. She exhibits no tenderness.  Abdominal: Soft. Bowel sounds are normal. She exhibits no distension and no mass. There is no tenderness. There is no rebound and no guarding.  Musculoskeletal: Normal range of motion.  Neurological: She is alert and oriented to person, place, and time. She has normal strength. No cranial nerve deficit or sensory deficit. She displays a negative Romberg sign. Coordination and gait normal. GCS eye subscore is 4. GCS verbal subscore is 5. GCS motor subscore is 6.  CN II-XII grossly in tact. 5/5 strength bilaterally in upper and lower extremities, normal coordination and gait.   Skin: Skin is warm and dry.  Psychiatric: Her speech is normal. She is not agitated, not aggressive and not actively hallucinating. She exhibits a depressed mood. She expresses homicidal and suicidal ideation. She expresses no suicidal plans and no homicidal plans.    ED Course  Procedures (including critical care time) Labs Review Labs Reviewed  CBC - Abnormal; Notable for the following:    WBC 11.4 (*)    All other components within normal limits  COMPREHENSIVE METABOLIC PANEL - Abnormal; Notable for the following:    BUN 5 (*)    Albumin 3.3 (*)    All other components within normal limits  SALICYLATE LEVEL - Abnormal; Notable for the  following:    Salicylate Lvl <2.0 (*)    All other components within normal limits  URINE RAPID DRUG SCREEN (HOSP PERFORMED) - Abnormal; Notable for the following:    Cocaine POSITIVE (*)    Benzodiazepines POSITIVE (*)    All other components within normal limits  ETHANOL  ACETAMINOPHEN LEVEL    Imaging Review No results found.   EKG Interpretation None      MDM   Final diagnoses:  Intractable migraine with aura with status migrainosus  Suicidal ideation  Depression   Pt is a 48yo female with hx of migraines and PTSD presenting to ED with c/o gradually worsening headache. Feels similar to previous migraines. On exam, no focal neuro deficit. Not concerned for [facility], CVA, meningitis or other emergent process taking place at this time.  Pt is reporting worsening depression, accompanied with SI/HI w/o specific plan.  Will consult TTS for evaluation.    5:03 PM Per labs, pt is medically cleared for TTS evaluation. Migraine being tx in ED with dilaudid, phenergan and IV fluids.  Urine  drug screen positive for cocaine and benzodiazepines.  Pt is to be evaluated by TTS to determine appropriate disposition.   9:18 PM pt consistently asking for more and more pain medication, however, due to extensive allergy list, treating her migraine has been challenging.  Pt has received dilaudid, IV fluids, phenergan, benadryl, and ativan with minimal relief, states pain improved from 10/10 to 8/10  Discussed with pt that narcotics are not first line tx for migraines. Pt requested one additional dose of dilaudid which did improve her HA enough to allow pt to be moved to Pod C for psych evaluation.   1:08 AM Pt still waiting for TTS evaluation. Plan is for pt to be evaluated by TTS for appropriate disposition.          Junius FinnerErin O'Malley, PA-C 10/18/13 [phone]

## 2013-10-18 ENCOUNTER — Encounter (HOSPITAL_COMMUNITY): Payer: Self-pay | Admitting: *Deleted

## 2013-10-18 ENCOUNTER — Inpatient Hospital Stay (HOSPITAL_COMMUNITY)
Admission: AD | Admit: 2013-10-18 | Discharge: 2013-10-23 | DRG: 885 | Disposition: A | Discharge status: Home / Self Care | Payer: Medicare Other | Source: Intra-hospital | Attending: Psychiatry | Admitting: Psychiatry

## 2013-10-18 DIAGNOSIS — F609 Personality disorder, unspecified: Secondary | ICD-10-CM | POA: Diagnosis present

## 2013-10-18 DIAGNOSIS — F4310 Post-traumatic stress disorder, unspecified: Secondary | ICD-10-CM | POA: Diagnosis present

## 2013-10-18 DIAGNOSIS — F909 Attention-deficit hyperactivity disorder, unspecified type: Secondary | ICD-10-CM | POA: Diagnosis present

## 2013-10-18 DIAGNOSIS — F332 Major depressive disorder, recurrent severe without psychotic features: Secondary | ICD-10-CM

## 2013-10-18 DIAGNOSIS — F17210 Nicotine dependence, cigarettes, uncomplicated: Secondary | ICD-10-CM | POA: Diagnosis present

## 2013-10-18 DIAGNOSIS — G4700 Insomnia, unspecified: Secondary | ICD-10-CM | POA: Diagnosis present

## 2013-10-18 DIAGNOSIS — F1420 Cocaine dependence, uncomplicated: Secondary | ICD-10-CM | POA: Diagnosis present

## 2013-10-18 DIAGNOSIS — F331 Major depressive disorder, recurrent, moderate: Secondary | ICD-10-CM | POA: Diagnosis present

## 2013-10-18 DIAGNOSIS — F411 Generalized anxiety disorder: Secondary | ICD-10-CM | POA: Diagnosis present

## 2013-10-18 DIAGNOSIS — E780 Pure hypercholesterolemia: Secondary | ICD-10-CM | POA: Diagnosis present

## 2013-10-18 DIAGNOSIS — F329 Major depressive disorder, single episode, unspecified: Secondary | ICD-10-CM | POA: Diagnosis present

## 2013-10-18 DIAGNOSIS — E039 Hypothyroidism, unspecified: Secondary | ICD-10-CM | POA: Diagnosis present

## 2013-10-18 DIAGNOSIS — R45851 Suicidal ideations: Secondary | ICD-10-CM | POA: Diagnosis present

## 2013-10-18 MED ORDER — LEVOTHYROXINE SODIUM 125 MCG PO TABS
125.0000 ug | ORAL_TABLET | Freq: Every day | ORAL | Status: DC
  Filled 2013-10-18: qty 1

## 2013-10-18 MED ORDER — LITHIUM CARBONATE 300 MG PO CAPS
300.0000 mg | ORAL_CAPSULE | Freq: Two times a day (BID) | ORAL | Status: DC
  Administered 2013-10-19 – 2013-10-23 (×9): 300 mg via ORAL
  Filled 2013-10-18: qty 6
  Filled 2013-10-18 (×14): qty 1
  Filled 2013-10-18: qty 6

## 2013-10-18 MED ORDER — OXYMORPHONE HCL ER 10 MG PO T12A
10.0000 mg | EXTENDED_RELEASE_TABLET | Freq: Two times a day (BID) | ORAL | Status: DC
  Administered 2013-10-18: 10 mg via ORAL
  Filled 2013-10-18: qty 1

## 2013-10-18 MED ORDER — BUSPIRONE HCL 10 MG PO TABS
10.0000 mg | ORAL_TABLET | Freq: Two times a day (BID) | ORAL | Status: DC
  Administered 2013-10-18 – 2013-10-23 (×10): 10 mg via ORAL
  Filled 2013-10-18: qty 1
  Filled 2013-10-18: qty 2
  Filled 2013-10-18 (×2): qty 1
  Filled 2013-10-18: qty 6
  Filled 2013-10-18 (×3): qty 1
  Filled 2013-10-18: qty 6
  Filled 2013-10-18 (×5): qty 1
  Filled 2013-10-18: qty 2
  Filled 2013-10-18: qty 1

## 2013-10-18 MED ORDER — TRAZODONE HCL 100 MG PO TABS
100.0000 mg | ORAL_TABLET | Freq: Every evening | ORAL | Status: DC | PRN
  Administered 2013-10-18 – 2013-10-22 (×5): 100 mg via ORAL
  Filled 2013-10-18: qty 3
  Filled 2013-10-18 (×5): qty 1

## 2013-10-18 MED ORDER — VENLAFAXINE HCL ER 150 MG PO CP24
150.0000 mg | ORAL_CAPSULE | Freq: Two times a day (BID) | ORAL | Status: DC
  Administered 2013-10-18: 150 mg via ORAL
  Filled 2013-10-18 (×2): qty 1

## 2013-10-18 MED ORDER — BUSPIRONE HCL 10 MG PO TABS
10.0000 mg | ORAL_TABLET | Freq: Two times a day (BID) | ORAL | Status: DC
  Administered 2013-10-18: 10 mg via ORAL
  Filled 2013-10-18: qty 1

## 2013-10-18 MED ORDER — OXYMORPHONE HCL ER 15 MG PO TB12: 15.0000 mg | ORAL_TABLET | Freq: Two times a day (BID) | ORAL | Status: DC

## 2013-10-18 MED ORDER — LITHIUM CARBONATE 300 MG PO CAPS
300.0000 mg | ORAL_CAPSULE | Freq: Two times a day (BID) | ORAL | Status: DC
Start: 2013-10-18 — End: 2013-10-18
  Administered 2013-10-18: 300 mg via ORAL
  Filled 2013-10-18: qty 1

## 2013-10-18 MED ORDER — VENLAFAXINE HCL ER 150 MG PO CP24
150.0000 mg | ORAL_CAPSULE | Freq: Every day | ORAL | Status: DC
  Administered 2013-10-19 – 2013-10-23 (×5): 150 mg via ORAL
  Filled 2013-10-18 (×8): qty 1
  Filled 2013-10-18: qty 3

## 2013-10-18 MED ORDER — LEVOTHYROXINE SODIUM 125 MCG PO TABS
125.0000 ug | ORAL_TABLET | Freq: Every day | ORAL | Status: DC
  Administered 2013-10-19 – 2013-10-23 (×5): 125 ug via ORAL
  Filled 2013-10-18 (×7): qty 1

## 2013-10-18 MED ORDER — AMPHETAMINE-DEXTROAMPHETAMINE 10 MG PO TABS
10.0000 mg | ORAL_TABLET | Freq: Two times a day (BID) | ORAL | Status: DC
  Administered 2013-10-18: 10 mg via ORAL
  Filled 2013-10-18: qty 1

## 2013-10-18 MED ORDER — DIAZEPAM 5 MG PO TABS
10.0000 mg | ORAL_TABLET | Freq: Four times a day (QID) | ORAL | Status: DC
  Administered 2013-10-18 (×2): 10 mg via ORAL
  Filled 2013-10-18 (×2): qty 2

## 2013-10-18 MED ORDER — MAGNESIUM HYDROXIDE 400 MG/5ML PO SUSP: 30.0000 mL | Freq: Every day | ORAL | Status: DC | PRN

## 2013-10-18 MED ORDER — DIAZEPAM 5 MG PO TABS
10.0000 mg | ORAL_TABLET | Freq: Two times a day (BID) | ORAL | Status: DC
  Administered 2013-10-18 – 2013-10-19 (×2): 10 mg via ORAL
  Filled 2013-10-18 (×2): qty 2

## 2013-10-18 MED ORDER — ALUM & MAG HYDROXIDE-SIMETH 200-200-20 MG/5ML PO SUSP: 30.0000 mL | ORAL | Status: DC | PRN

## 2013-10-18 MED ORDER — MORPHINE SULFATE ER 15 MG PO TBCR
15.0000 mg | EXTENDED_RELEASE_TABLET | Freq: Two times a day (BID) | ORAL | Status: DC
  Administered 2013-10-18 – 2013-10-23 (×10): 15 mg via ORAL
  Filled 2013-10-18 (×10): qty 1

## 2013-10-18 MED ORDER — ACETAMINOPHEN 325 MG PO TABS: 650.0000 mg | ORAL_TABLET | Freq: Four times a day (QID) | ORAL | Status: DC | PRN

## 2013-10-18 MED ORDER — TRAZODONE HCL 50 MG PO TABS: 300.0000 mg | ORAL_TABLET | Freq: Every day | ORAL | Status: DC

## 2013-10-18 NOTE — ED Notes (Signed)
Patient took shower.

## 2013-10-18 NOTE — ED Notes (Signed)
[facility] transportation called. 

## 2013-10-18 NOTE — Tx Team (Signed)
Initial Interdisciplinary Treatment Plan   PATIENT STRESSORS: Financial difficulties Health problems Legal issue   PROBLEM LIST: Problem List/Patient Goals Date to be addressed Date deferred Reason deferred Estimated date of resolution  Anxiety 10-18-13           Depression 10-18-13           Sleep Disturbance 10-18-13           SI / HI 10-18-13                  DISCHARGE CRITERIA:  Ability to meet basic life and health needs Adequate post-discharge living arrangements Improved stabilization in mood, thinking, and/or behavior Motivation to continue treatment in a less acute level of care Safe-care adequate arrangements made Verbal commitment to aftercare and medication compliance  PRELIMINARY DISCHARGE PLAN: Outpatient therapy Return to previous living arrangement  PATIENT/FAMIILY INVOLVEMENT: This treatment plan has been presented to and reviewed with the patient, Glenford PeersChristine M Burzynski.  The patient and family have been given the opportunity to ask questions and make suggestions.  Sherryl MangesWesseh, Ezinne Yogi 10/18/2013, 6:38 PM
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
## 2013-10-18 NOTE — BH Assessment (Signed)
[facility] Assessment Progress Note  The following facilities have been contacted to refer pt with results as noted:  *[facility]: at 11:54 Jerilynn SomCalvin reports that he is uncertain about their admitting status.  He will check and call me back.  Results pending at this time. *[facility]: at 13:09 Albin FellingCarla reports that they are at capacity. [facility]: at 13:10 call rolled to voice mail for Jiles Haroldony Blanton and a message was left.  Response pending at this time. [facility]: at 13:12 a message was left for Darlene.  At 13:23 she called back to inform me that they only have geriatric beds at this time. Turner Daniels*[facility]: several calls were placed starting at 13:15.  In all cases they rolled to voice mail and a message was left.  Response pending at this time. Christell Constant*[facility]: at 13:18 Victorino DikeJennifer reports that they are unlikely to have beds, but welcomes referral.  Documentation was faxed. *[facility]: at 13:20 they report that they are at capacity. [facility]: several calls were placed starting at 13:15.  In all cases they rolled to voice mail and a message was left.  Response pending at this time.  Doylene Canninghomas Dustyn Armbrister, MA Triage Specialist 10/18/2013 @ 16:19

## 2013-10-18 NOTE — ED Notes (Signed)
Pt reports feelings on anxiety - declines offer for PO tylenol however requests her PRN ativan for anxiety.

## 2013-10-18 NOTE — ED Provider Notes (Signed)
[facility] Dawna PartKampos.   [name], MD 10/18/13 [phone]

## 2013-10-18 NOTE — ED Notes (Signed)
Message sent to pharmacy to request meds be sent to this location
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
## 2013-10-18 NOTE — ED Notes (Signed)
Patient states she has had a migraine (10/10) all night and she hasnt been able to sleep all night, feeling si/hi, very frustrated, states she feels very anxious.   Patient states she is in a lot of pain and nauseated and feels like shes going to throw up. She states she takes strong medicine at home and tylenol will not help.
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
## 2013-10-18 NOTE — ED Notes (Signed)
Called pharmacy again to check on pt meds.
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
## 2013-10-18 NOTE — ED Notes (Signed)
Patient requesting daily meds.  Spoke with Dr. Romeo AppleHarrison, to schedule daily meds.

## 2013-10-19 ENCOUNTER — Encounter (HOSPITAL_COMMUNITY): Payer: Self-pay | Admitting: Psychiatry

## 2013-10-19 DIAGNOSIS — F4310 Post-traumatic stress disorder, unspecified: Secondary | ICD-10-CM | POA: Diagnosis present

## 2013-10-19 DIAGNOSIS — R45850 Homicidal ideations: Secondary | ICD-10-CM

## 2013-10-19 DIAGNOSIS — F329 Major depressive disorder, single episode, unspecified: Secondary | ICD-10-CM

## 2013-10-19 MED ORDER — DIAZEPAM 5 MG PO TABS: 10.0000 mg | ORAL_TABLET | Freq: Three times a day (TID) | ORAL | Status: DC

## 2013-10-19 MED ORDER — BUTORPHANOL TARTRATE 10 MG/ML NA SOLN
1.0000 | NASAL | Status: DC | PRN
Start: 2013-10-19 — End: 2013-10-23
  Administered 2013-10-19 – 2013-10-23 (×10): 1 via NASAL

## 2013-10-19 MED ORDER — AMPHETAMINE-DEXTROAMPHET ER 10 MG PO CP24
10.0000 mg | ORAL_CAPSULE | Freq: Two times a day (BID) | ORAL | Status: DC
  Administered 2013-10-19 – 2013-10-23 (×8): 10 mg via ORAL
  Filled 2013-10-19 (×8): qty 1

## 2013-10-19 MED ORDER — NICOTINE 21 MG/24HR TD PT24
21.0000 mg | MEDICATED_PATCH | Freq: Every day | TRANSDERMAL | Status: DC
Start: 2013-10-19 — End: 2013-10-23
  Administered 2013-10-19 – 2013-10-22 (×4): 21 mg via TRANSDERMAL
  Filled 2013-10-19 (×7): qty 1

## 2013-10-19 MED ORDER — DIAZEPAM 5 MG PO TABS
10.0000 mg | ORAL_TABLET | Freq: Three times a day (TID) | ORAL | Status: DC
  Administered 2013-10-19 – 2013-10-23 (×12): 10 mg via ORAL
  Filled 2013-10-19 (×13): qty 2

## 2013-10-19 MED ORDER — OXYCODONE HCL 5 MG PO TABS
5.0000 mg | ORAL_TABLET | Freq: Four times a day (QID) | ORAL | Status: DC | PRN
  Administered 2013-10-19 – 2013-10-22 (×7): 5 mg via ORAL
  Filled 2013-10-19 (×7): qty 1

## 2013-10-19 NOTE — ED Provider Notes (Signed)
Medical screening examination/treatment/procedure(s) were performed by non-physician practitioner and as supervising physician I was immediately available for consultation/collaboration.   EKG Interpretation None        Akela Pocius T Apoorva Bugay, MD 10/19/13 0713 
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
## 2013-10-19 NOTE — Progress Notes (Signed)
D: Patient denies HI and A/V hallucinations; patient reports sleep to be; reports appetite to be ; reports energy level is ; reports ability to pay attention to; rates depression as 8/10; rates hopelessness 0/10; rates anxiety as 10/10; patient having thoughts of SI and reports that it is all the time; patient reports migraines and has been given pain medication; patient reports that her goal is to watch anger and to stay positive  A: Monitored q 15 minutes; patient encouraged to attend groups; patient educated about medications; patient given medications per physician orders; patient encouraged to express feelings and/or concerns  R: Patient is cooperative; patient is flat and sad; patient asking questions about her medications more than once and she wants to get it  done correctly and wants them to stay the way she has them; patient's interaction with staff and peers is appropriate; patient was able to set goal to talk with staff 1:1 when having feelings of SI; patient is taking medications as prescribed and tolerating medications; patient is attending all groups and engaging

## 2013-10-19 NOTE — Progress Notes (Signed)
[facility] Group Notes:  (Nursing/MHT/Case Management/Adjunct)  Date:  10/19/2013  Time:  10:34 PM  Type of Therapy:  AA  Participation Level:  Minimal  Participation Quality:  Appropriate  Affect:  Appropriate  Cognitive:  Appropriate  Insight:  Appropriate  Engagement in Group:  Engaged  Modes of Intervention:  Education  Summary of Progress/Problems: Pt. Attended group. Sondra ComeWilson, Alinna Siple J 10/19/2013, 10:34 PM

## 2013-10-19 NOTE — Progress Notes (Signed)
D: Pt denies SI/HI/AVH. Pt is pleasant and cooperative. Pt stated she was feeling ok, but had HA earlier.   A: Pt was offered support and encouragement. Pt was given scheduled medications. Pt was encourage to attend groups. Q 15 minute checks were done for safety.   R:Pt attends groups and interacts well with peers and staff. Pt is taking medication. Pt has no complaints at this time .Pt receptive to treatment and safety maintained on unit.

## 2013-10-19 NOTE — [facility] Group Notes (Signed)
   [facility]  10/19/2013  8:45 AM   Participation Quality: Alert, Appropriate and Oriented  Mood/Affect: Depressed and Flat  Depression Rating: 8  Anxiety Rating: 10  Thoughts of Suicide: Pt endorses SI/HI  Will you contract for safety? CSW continuing to assess  Current AVH: Pt denies  Plan for Discharge/Comments: Pt attended discharge planning group and actively participated in group. CSW provided pt with today's workbook. Patient reports that she is having a "tough time and going through a lot" at this time in her life. She endorses high levels of depression and anxiety, as well as SI/HI. She follow up with Dr. Nila [facility]hewWessen at [facility] [place] and states that she has an upcoming appointment on October 12th; She also sees a therapist Tommy RainwaterLynn Murrey at [facility]. She reports that her mother will take her home at discharge and that she can return to her own home.  Transportation Means: Pt reports access to transportation  Supports: No supports mentioned at this time  Samuella BruinKristin Deniese Oberry, MSW, [employer]CSWA Clinical Social Worker [facility] [phone]

## 2013-10-19 NOTE — Progress Notes (Signed)
Patient ID: Glenford PeersChristine M Reichel, female   DOB: [birth year], 48 y.o.   MRN: [id number] PER STATE REGULATIONS 482.30  THIS CHART WAS REVIEWED FOR MEDICAL NECESSITY WITH RESPECT TO THE PATIENT'S ADMISSION/ DURATION OF STAY.  NEXT REVIEW DATE: 10/22/2013  Willa RoughJENNIFER JONES Norberta Stobaugh, RN, BSN CASE MANAGER

## 2013-10-19 NOTE — [facility] Group Notes (Signed)
[facility] LCSW Group Therapy  Feelings Around Relapse 1:15 -2:30        10/19/2013   Type of Therapy:  Group Therapy  Participation Level:  Appropriate  Participation Quality:  Appropriate  Affect:  Appropriate  Cognitive:  Attentive Appropriate  Insight:  Developing/Improving  Engagement in Therapy: Developing/Improving  Modes of Intervention:  Discussion Exploration Problem-Solving Supportive  Summary of Progress/Problems:  The topic for today was feelings around relapse.    Patient processed feelings toward relapse and was able to relate to peers. She shared relapse for her would be isolation.  She shared she often forces herself to get dressed and get out of the house.  Patient shared she will not miss an appointment with with MD but often misses her therapy appointment. Patient identified coping skills that can be used to prevent a relapse.   Wynn BankerHodnett, Tramon Crescenzo Hairston 10/19/2013

## 2013-10-19 NOTE — Tx Team (Signed)
Interdisciplinary Treatment Plan Update (Adult) Date: 10/19/2013   Time Reviewed: 9:30 AM  Progress in Treatment: Attending groups: Yes Participating in groups: Yes Taking medication as prescribed: Yes Tolerating medication: Yes Family/Significant other contact made: No, CSW continuing to assess for appropriate collateral contact Patient understands diagnosis: Yes Discussing patient identified problems/goals with staff: Yes Medical problems stabilized or resolved: Yes Denies suicidal/homicidal ideation: No, patient endorsing SI/HI at this time Issues/concerns per patient self-inventory: Yes Other:  New problem(s) identified: N/A  Discharge Plan or Barriers: [place] And will have her mother pick her up at discharge. She plans to follow up with [facility]essen at [facility] [place] on Oct[place] 12th and therapist [facility]waterLynn Mur[facility] for outpatient services. Discharge anticipated for Monday 10/22/13.  Reason for Continuation of Hospitalization:  Depression Anxiety Medication Stabilization   Comments: N/A  Estimated length of stay: 3-5 days  For review of initial/current patient goals, please see plan of care.  Attendees: Patient:    Family:    Physician: Dr. Jama Flavorsobos; Dr. Dub MikesLugo 10/19/2013 9:30 AM  Nursing: Harold Barbanonecia Byrd; Leighton ParodyBritney Tyson; Shelda Jakesatty Duke , RN 10/19/2013 9:30 AM  Clinical Social Worker: Samuella BruinKristin Saleena Tamas,  LCSWA 10/19/2013 9:30 AM  Other: Juline PatchQuylle Hodnett, LCSW 10/19/2013 9:30 AM  Other: Leisa LenzValerie Enoch, [facility] Liaison 10/19/2013 9:30 AM  Other: Onnie BoerJennifer Clark, Case Manager 10/19/2013 9:30 AM  Other: Tomasita Morrowelora Sutton, P4CC  10/19/2013 9:30 AM  Other:    Other:    Other:    Other:    Other:        Scribe for Treatment Team:  Samuella BruinKristin Beauty Pless, MSW, [employer]CSWA [phone]
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
## 2013-10-20 DIAGNOSIS — F331 Major depressive disorder, recurrent, moderate: Principal | ICD-10-CM

## 2013-10-20 DIAGNOSIS — R45851 Suicidal ideations: Secondary | ICD-10-CM

## 2013-10-20 NOTE — [facility] Group Notes (Signed)
[facility] Group Notes:  (Nursing/MHT/Case Management/Adjunct)  Date:  10/20/2013  Time:  4:13 PM  Type of Therapy: Psychoeducational Skills- Healthy Coping Skills   Participation Level: Did Not Attend  [name], [name] Shanta 10/20/2013, 4:13 PM

## 2013-10-20 NOTE — Progress Notes (Signed)
D: Pt denies HI/AVH. Pt is pleasant and cooperative. Pt +ve for SI-contracts. Pt stated she was feeling like cutting, but contracts for safety with that also. Pt scared and nervous about the future and the friends she may have to let go to move forward in her life.   A: Pt was offered support and encouragement. Pt was given scheduled medications. Pt was encourage to attend groups. Q 15 minute checks were done for safety.   R:Pt attends groups and interacts well with peers and staff. Pt is taking medication.Pt receptive to treatment and safety maintained on unit.

## 2013-10-20 NOTE — Plan of Care (Signed)
Problem: Ineffective individual coping Goal: STG: Patient will remain free from self harm Outcome: Progressing Pt remains safe on unit, contracts for safety  Problem: Alteration in mood Goal: LTG-Patient reports reduction in suicidal thoughts (Patient reports reduction in suicidal thoughts and is able to verbalize a safety plan for whenever patient is feeling suicidal)  Outcome: Not Progressing Pt complains of passive SI-but contracts for safety

## 2013-10-20 NOTE — Progress Notes (Signed)
[facility] MD Progress Note  10/20/2013 3:21 PM ZYLA DASCENZO  MRN:  [id number]  Subjective:  Kayln reports, "I still hopeless and suicidal".  OWynona Canes is seen and chart reviewed. Patient continues to endorse ongoing depressive symptoms. She is rating her depression #8-9.  She is endorsing suicidal thoughts. However, Tierre is participating in groups, is observed interacting with the other patients. Her affect does not reflect her report of hopelessness. She is cooperative and taking her medications as prescribed.  Diagnosis:   DSM5: Schizophrenia Disorders:  NA Obsessive-Compulsive Disorders:  NA Trauma-Stressor Disorders:  Posttraumatic Stress Disorder (309.81) Substance/Addictive Disorders:  NA Depressive Disorders:  MDD (major depressive disorder)  Total Time spent with patient: 35 minutes  Axis I: MDD (major depressive disorder) Axis II: Deferred Axis III:  Past Medical History  Diagnosis Date  . Migraine   . PTSD (post-traumatic stress disorder)   . Anxiety   . Personality disorder   . Depression   . High cholesterol   . Hypothyroid   . ADHD (attention deficit hyperactivity disorder)    Axis IV: other psychosocial or environmental problems and mental illness, chronic Axis V: 41-50 serious symptoms  ADL's:  Impaired  Sleep: Fair  Appetite:  Good  Suicidal Ideation:  Plan:  Denies Intent:  Denies Means:  Denies Homicidal Ideation:  Plan:  Denies Intent:  Denies Means:  Denies AEB (as evidenced by):  Psychiatric Specialty Exam: Physical Exam  Psychiatric: Her speech is normal and behavior is normal. Judgment normal. Her mood appears anxious. Cognition and memory are normal. She exhibits a depressed mood. She expresses suicidal ideation. She expresses no suicidal plans.    Review of Systems  Constitutional: Negative.   HENT: Negative.   Eyes: Negative.   Respiratory: Negative.   Cardiovascular: Negative.   Gastrointestinal: Negative.    Genitourinary: Negative.   Musculoskeletal: Negative.   Skin: Negative.   Neurological: Negative.   Endo/Heme/Allergies: Negative.   Psychiatric/Behavioral: Positive for depression and suicidal ideas. Negative for hallucinations, memory loss and substance abuse. The patient is nervous/anxious and has insomnia.     Blood pressure 96/59, pulse 87, temperature 98.3 F (36.8 C), temperature source Oral, resp. rate 18, height 5\' 3"  (1.6 m), weight 60.782 kg (134 lb), last menstrual period 12/04/2010.Body mass index is 23.74 kg/(m^2).  General Appearance: Disheveled  Eye Contact::  Good  Speech:  Clear and Coherent  Volume:  Normal  Mood:  Depressed  Affect:  Flat  Thought Process:  Coherent and Intact  Orientation:  Full (Time, Place, and Person)  Thought Content:  Rumination  Suicidal Thoughts:  Yes.  without intent/plan  Homicidal Thoughts:  No  Memory:  Immediate;   Good Recent;   Good Remote;   Good  Judgement:  Fair  Insight:  Shallow  Psychomotor Activity:  Normal  Concentration:  Good  Recall:  Good  Fund of Knowledge:Fair  Language: Fair  Akathisia:  No  Handed:  Right  AIMS (if indicated):     Assets:  Communication Skills Desire for Improvement  Sleep:  Number of Hours: 5.25   Musculoskeletal: Strength & Muscle Tone: within normal limits Gait & Station: normal Patient leans: N/A  Current Medications: Current Facility-Administered Medications  Medication Dose Route Frequency Provider Last Rate Last Dose  . acetaminophen (TYLENOL) tablet 650 mg  650 mg Oral Q6H PRN Nehemiah Massed, MD      . alum & mag hydroxide-simeth (MAALOX/MYLANTA) 200-200-20 MG/5ML suspension 30 mL  30 mL Oral Q4H PRN Nehemiah Massed,  MD      . amphetamine-dextroamphetamine (ADDERALL XR) 24 hr capsule 10 mg  10 mg Oral BID Rachael FeeIrving A Lugo, MD   10 mg at 10/20/13 1428  . busPIRone (BUSPAR) tablet 10 mg  10 mg Oral BID Nehemiah MassedFernando Cobos, MD   10 mg at 10/20/13 0841  . butorphanol (STADOL) nasal spray  1 spray  1 spray Nasal Q4H PRN Rachael FeeIrving A Lugo, MD   1 spray at 10/20/13 0528  . diazepam (VALIUM) tablet 10 mg  10 mg Oral TID Rachael FeeIrving A Lugo, MD   10 mg at 10/20/13 1211  . levothyroxine (SYNTHROID, LEVOTHROID) tablet 125 mcg  125 mcg Oral QAC breakfast Nehemiah MassedFernando Cobos, MD   125 mcg at 10/20/13 [phone]  . lithium carbonate capsule 300 mg  300 mg Oral BID WC Nehemiah MassedFernando Cobos, MD   300 mg at 10/20/13 0841  . magnesium hydroxide (MILK OF MAGNESIA) suspension 30 mL  30 mL Oral Daily PRN Nehemiah MassedFernando Cobos, MD      . morphine (MS CONTIN) 12 hr tablet 15 mg  15 mg Oral Q12H Nehemiah MassedFernando Cobos, MD   15 mg at 10/20/13 0840  . nicotine (NICODERM CQ - dosed in mg/24 hours) patch 21 mg  21 mg Transdermal Daily Nehemiah MassedFernando Cobos, MD   21 mg at 10/20/13 0841  . oxyCODONE (Oxy IR/ROXICODONE) immediate release tablet 5 mg  5 mg Oral Q6H PRN Rachael FeeIrving A Lugo, MD   5 mg at 10/20/13 1211  . traZODone (DESYREL) tablet 100 mg  100 mg Oral QHS PRN Nehemiah MassedFernando Cobos, MD   100 mg at 10/19/13 2333  . venlafaxine XR (EFFEXOR-XR) 24 hr capsule 150 mg  150 mg Oral Q breakfast Nehemiah MassedFernando Cobos, MD   150 mg at 10/20/13 [id number]    Lab Results: No results found for this or any previous visit (from the past 48 hour(s)).  Physical Findings: AIMS: Facial and Oral Movements Muscles of Facial Expression: None, normal Lips and Perioral Area: None, normal Jaw: None, normal Tongue: None, normal,Extremity Movements Upper (arms, wrists, hands, fingers): None, normal Lower (legs, knees, ankles, toes): None, normal, Trunk Movements Neck, shoulders, hips: None, normal, Overall Severity Severity of abnormal movements (highest score from questions above): None, normal Incapacitation due to abnormal movements: None, normal Patient's awareness of abnormal movements (rate only patient's report): No Awareness, Dental Status Current problems with teeth and/or dentures?: No Does patient usually wear dentures?: No  CIWA:    COWS:     Treatment Plan Summary: Daily  contact with patient to assess and evaluate symptoms and progress in treatment Medication management  Plan: 1. Continue crisis management, mood stabilization & relapse prevention.. 2. Continue current medication management to reduce current symptoms to base line and improve the  patient's overall level of functioning;  3. Treat health problems as indicated - 4. Develop treatment plan to decrease risk of symptom exacerbation upon discharge and the need for  readmission. 5. Psycho-social education regarding relapse prevention and self care.  Medical Decision Making Problem Points:  Established problem, stable/improving (1), Review of last therapy session (1) and Review of psycho-social stressors (1) Data Points:  Review of medication regiment & side effects (2) Review of new medications or change in dosage (2)  I certify that inpatient services furnished can reasonably be expected to improve the patient's condition.   Armandina Stammerwoko, Agnes I, PMHNP-BC  10/20/2013, 3:21 PM  Patient seen, evaluated and I agree with notes by Nurse Practitioner. Thedore MinsMojeed Clarita Mcelvain, MD

## 2013-10-20 NOTE — Progress Notes (Signed)
[facility] Group Notes:  (Nursing/MHT/Case Management/Adjunct)  Date:  10/20/2013  Time:  10:59 PM  Type of Therapy:  AA  Participation Level:  Minimal  Participation Quality:  Appropriate  Affect:  Appropriate  Cognitive:  Appropriate  Insight:  Appropriate  Engagement in Group:  Engaged  Modes of Intervention:  Discussion and Education  Summary of Progress/Problems: Pt. Attended AA group.  Sondra ComeWilson, Rai Sinagra J 10/20/2013, 10:59 PM

## 2013-10-20 NOTE — [facility] Group Notes (Addendum)
[facility] Group Notes:  (Clinical Social Work)  10/20/2013     10-11AM  Summary of Progress/Problems:   The main focus of today's process group was to learn how to use a decisional balance exercise to move forward in the Stages of Change, which were described and discussed.  Motivational Interviewing and a worksheet were utilized to help patients explore in depth the perceived benefits and costs of a self-sabotaging behavior, as well as the  benefits and costs of replacing that with a healthy coping mechanism.   The patient expressed that she uses isolation a great deal, and participated heavily in the discussion listing the benefits/costs of isolation.  She feels she has made a decision to change in coming to the hospital for help, and is now in Preparation Stage of Change.  She was able to acknowledge a number of other self-sabotaging behaviors as well.  Type of Therapy:  Group Therapy - Process   Participation Level:  Active  Participation Quality:  Attentive and Sharing  Affect:  Depressed and Flat  Cognitive:  Appropriate and Oriented  Insight:  Engaged  Engagement in Therapy:  Engaged  Modes of Intervention:  Education, Motivational Interviewing  Ambrose MantleMareida Grossman-Orr, LCSW 10/20/2013, 12:23 PM

## 2013-10-20 NOTE — Progress Notes (Signed)
Patient ID: [name], female   DOB: [birth year], 48 y.o.   MRN: [id number]   D: Pt has been very flat and depressed on the unit today. Pt has also reported on/off SI, however contracts for safety. Pt reported that she was very anxious and in a lot of pain, pt has been getting as needed medication around the clock. Pt did not attend any groups, nor did she engage in treatment. Pt reported being negative HI, no AH/VH noted. A: 15 min checks continued for patient safety. R: Pt safety maintained.

## 2013-10-20 NOTE — [facility] Group Notes (Signed)
[facility] Group Notes:  (Nursing/MHT/Case Management/Adjunct)  Date:  10/20/2013  Time:  11:37 AM  Type of Therapy:  Psychoeducational Skills- Patient Self Inventory  Participation Level:  Did Not Attend  [name], [name] Shanta 10/20/2013, 11:37 AM
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
## 2013-10-21 MED ORDER — TRAZODONE HCL 50 MG PO TABS: 50.0000 mg | ORAL_TABLET | Freq: Once | ORAL | Status: DC

## 2013-10-21 NOTE — [facility] Group Notes (Signed)
[facility] Group Notes:  (Clinical Social Work)  10/21/2013  10:00-11:00AM  Summary of Progress/Problems:   The main focus of today's process group was to   1)  discuss the importance of adding supports  2)  define health supports versus unhealthy supports  3)  identify the patient's current unhealthy supports and plan how to handle them  4)  Identify the patient's current healthy supports and plan what to add.  An emphasis was placed on using counselor, doctor, therapy groups, 12-step groups, and problem-specific support groups to expand supports.    The patient expressed full comprehension of the concepts presented, and agreed that there is a need to add more supports.  The patient stated she cannot think of any current supports that are healthy, and talked about how hard it is going to be to have a discussion with her one friend to say that she must distance herself.  The patient offered continuous, appropriate support throughout group to other group members.  She also expressed her opinion that the hospital rules are correct in discouraging patients from exchanging phone numbers.    Type of Therapy:  Process Group with Motivational Interviewing  Participation Level:  Active  Participation Quality:  Attentive, Sharing and Supportive  Affect:  Blunted and Depressed  Cognitive:  Alert, Appropriate and Oriented  Insight:  Engaged  Engagement in Therapy:  Engaged  Modes of Intervention:   Education, Support and Processing, Activity  Pilgrim's PrideMareida Grossman-Orr, LCSW 10/21/2013, 12:15pm

## 2013-10-21 NOTE — Progress Notes (Signed)
Patient ID: [name], female   DOB: 10-Nov-1965, 48 y.o.   MRN: [id number] [facility] MD Progress Note  10/21/2013 2:16 PM [name]  MRN:  [id number]  Subjective:  [name] reports, "I'm still feeling suicidal and I thought about cutting myself today. I have migraine headaches, that is why I'm lying down".  OWynona Roth is seen and chart reviewed. Patient continues to endorse suicidal ideations. She says her suicidal ideations can last from 7 - 10 days. She is visible on the unit in the early part of the day, but is in her room during this assessment because she says she has headaches. She denies Hi, AVH.  Diagnosis:   DSM5: Schizophrenia Disorders:  NA Obsessive-Compulsive Disorders:  NA Trauma-Stressor Disorders:  Posttraumatic Stress Disorder (309.81) Substance/Addictive Disorders:  NA Depressive Disorders:  MDD (major depressive disorder)  Total Time spent with patient: 35 minutes  Axis I: MDD (major depressive disorder) Axis II: Deferred Axis III:  Past Medical History  Diagnosis Date  . Migraine   . PTSD (post-traumatic stress disorder)   . Anxiety   . Personality disorder   . Depression   . High cholesterol   . Hypothyroid   . ADHD (attention deficit hyperactivity disorder)    Axis IV: other psychosocial or environmental problems and mental illness, chronic Axis V: 41-50 serious symptoms  ADL's:  Impaired  Sleep: Fair  Appetite:  Good  Suicidal Ideation:  Plan:  cutting wrist Intent:  Denies Means:  Denies Homicidal Ideation:  Plan:  Denies Intent:  Denies Means:  Denies AEB (as evidenced by):  Psychiatric Specialty Exam: Physical Exam  Psychiatric: Her speech is normal and behavior is normal. Judgment normal. Her mood appears anxious. Cognition and memory are normal. She exhibits a depressed mood. She expresses suicidal ideation. She expresses no suicidal plans.    Review of Systems  Constitutional: Negative.   HENT: Negative.   Eyes:  Negative.   Respiratory: Negative.   Cardiovascular: Negative.   Gastrointestinal: Negative.   Genitourinary: Negative.   Musculoskeletal: Negative.   Skin: Negative.   Neurological: Negative.   Endo/Heme/Allergies: Negative.   Psychiatric/Behavioral: Positive for depression and suicidal ideas. Negative for hallucinations, memory loss and substance abuse. The patient is nervous/anxious and has insomnia.     Blood pressure 92/61, pulse 89, temperature 98.3 F (36.8 C), temperature source Oral, resp. rate 18, height 5\' 3"  (1.6 m), weight 60.782 kg (134 lb), last menstrual period 12/04/2010.Body mass index is 23.74 kg/(m^2).  General Appearance: Disheveled  Eye Contact::  Good  Speech:  Clear and Coherent  Volume:  Normal  Mood:  Depressed  Affect:  Flat  Thought Process:  Coherent and Intact  Orientation:  Full (Time, Place, and Person)  Thought Content:  Rumination  Suicidal Thoughts:  Yes.  with intent/plan (planning on cutting her wrists)  Homicidal Thoughts:  No  Memory:  Immediate;   Good Recent;   Good Remote;   Good  Judgement:  Fair  Insight:  Shallow  Psychomotor Activity:  Normal  Concentration:  Good  Recall:  Good  Fund of Knowledge:Fair  Language: Fair  Akathisia:  No  Handed:  Right  AIMS (if indicated):     Assets:  Communication Skills Desire for Improvement  Sleep:  Number of Hours: 5.75   Musculoskeletal: Strength & Muscle Tone: within normal limits Gait & Station: normal Patient leans: N/A  Current Medications: Current Facility-Administered Medications  Medication Dose Route Frequency Provider Last Rate Last Dose  . acetaminophen (  TYLENOL) tablet 650 mg  650 mg Oral Q6H PRN Janet Massed, MD      . alum & mag hydroxide-simeth (MAALOX/MYLANTA) 200-200-20 MG/5ML suspension 30 mL  30 mL Oral Q4H PRN Janet Massed, MD      . amphetamine-dextroamphetamine (ADDERALL XR) 24 hr capsule 10 mg  10 mg Oral BID Janet Fee, MD   10 mg at 10/21/13 [phone]  .  busPIRone (BUSPAR) tablet 10 mg  10 mg Oral BID Janet Massed, MD   10 mg at 10/21/13 [phone]  . butorphanol (STADOL) nasal spray 1 spray  1 spray Nasal Q4H PRN Janet Fee, MD   1 spray at 10/21/13 1114  . diazepam (VALIUM) tablet 10 mg  10 mg Oral TID Janet Fee, MD   10 mg at 10/21/13 [phone]  . levothyroxine (SYNTHROID, LEVOTHROID) tablet 125 mcg  125 mcg Oral QAC breakfast Janet Massed, MD   125 mcg at 10/21/13 0604  . lithium carbonate capsule 300 mg  300 mg Oral BID WC Janet Massed, MD   300 mg at 10/21/13 8119  . magnesium hydroxide (MILK OF MAGNESIA) suspension 30 mL  30 mL Oral Daily PRN Janet Massed, MD      . morphine (MS CONTIN) 12 hr tablet 15 mg  15 mg Oral Q12H Janet Massed, MD   15 mg at 10/21/13 [phone]  . nicotine (NICODERM CQ - dosed in mg/24 hours) patch 21 mg  21 mg Transdermal Daily Janet Massed, MD   21 mg at 10/21/13 [phone]  . oxyCODONE (Oxy IR/ROXICODONE) immediate release tablet 5 mg  5 mg Oral Q6H PRN Janet Fee, MD   5 mg at 10/20/13 2304  . traZODone (DESYREL) tablet 100 mg  100 mg Oral QHS PRN Janet Massed, MD   100 mg at 10/20/13 2304  . venlafaxine XR (EFFEXOR-XR) 24 hr capsule 150 mg  150 mg Oral Q breakfast Janet Massed, MD   150 mg at 10/21/13 2130    Lab Results: No results found for this or any previous visit (from the past 48 hour(s)).  Physical Findings: AIMS: Facial and Oral Movements Muscles of Facial Expression: None, normal Lips and Perioral Area: None, normal Jaw: None, normal Tongue: None, normal,Extremity Movements Upper (arms, wrists, hands, fingers): None, normal Lower (legs, knees, ankles, toes): None, normal, Trunk Movements Neck, shoulders, hips: None, normal, Overall Severity Severity of abnormal movements (highest score from questions above): None, normal Incapacitation due to abnormal movements: None, normal Patient's awareness of abnormal movements (rate only patient's report): No Awareness, Dental Status Current problems  with teeth and/or dentures?: No Does patient usually wear dentures?: No  CIWA:    COWS:     Treatment Plan Summary: Daily contact with patient to assess and evaluate symptoms and progress in treatment Medication management  Plan: 1. Continue crisis management, mood stabilization & relapse prevention.. 2. Continue current medication management to reduce current symptoms to base line and improve the  patient's overall level of functioning; Adderall XR 10 mg for ADHD, Buspar 10 mg for anxiety, Lithium for mood stabilization, Valium for anxiety, Effexor XR 150 mg for depression & Trazodone 150 mg for insomnia. 3. Treat health problems as indicated - Resumed all pertinent home medications. 4. Develop treatment plan to decrease risk of symptom exacerbation upon discharge and the need for  readmission. 5. Psycho-social education regarding relapse prevention and self care.  Medical Decision Making Problem Points:  Established problem, stable/improving (1), Review of last therapy session (1) and Review  of psycho-social stressors (1) Data Points:  Review of medication regiment & side effects (2) Review of new medications or change in dosage (2)  I certify that inpatient services furnished can reasonably be expected to improve the patient's condition.   Armandina Stammerwoko, Agnes I, PMHNP-BC  10/21/2013, 2:16 PM   Patient seen, evaluated and I agree with notes by Nurse Practitioner. Thedore MinsMojeed Estefania Kamiya, MD

## 2013-10-21 NOTE — [facility] Group Notes (Signed)
[facility] Group Notes:  (Nursing/MHT/Case Management/Adjunct)  Date:  10/21/2013  Time:  12:21 PM  Type of Therapy:  Psychoeducational Skills-Pt Self Inventory Group   Participation Level:  Did Not Attend  [name], Aland Chestnutt Shanta 10/21/2013, 12:21 PM

## 2013-10-21 NOTE — Progress Notes (Signed)
Patient did attend the evening speaker AA meeting.  

## 2013-10-21 NOTE — Progress Notes (Addendum)
Patient ID: [name], female   DOB: Jun 23, 1965, 48 y.o.   MRN: [id number]   D: Pt continues to be very flat and depressed on the unit today. Pt also continues to be very anxious, and in severe pain. Pt did not attend any groups nor did she engage in treatment. Pt reported that she was positive SI, however able to contract for safety. Pt reported being negative HI, no AH/VH noted. A: 15 min checks continued for patient safety. R: Pt safety maintained.

## 2013-10-21 NOTE — [facility] Group Notes (Signed)
[facility] Group Notes:  (Nursing/MHT/Case Management/Adjunct)  Date:  10/21/2013  Time:  6:05 PM  Type of Therapy:  Nurse Education  Participation Level:  Did Not Attend  Participation Quality:  did not attend  Affect:  did not attend  Cognitive:  did not attend  Insight:  None  Engagement in Group:  did not attend  Modes of Intervention:  did not attend  Summary of Progress/Problems: pt did not attend she was in her bed asleep.   Jule SerKent, Rieley Khalsa Gail 10/21/2013, 6:05 PM

## 2013-10-22 DIAGNOSIS — F411 Generalized anxiety disorder: Secondary | ICD-10-CM

## 2013-10-22 LAB — LITHIUM LEVEL: Lithium Lvl: 0.6 mEq/L — ABNORMAL LOW (ref 0.80–1.40)

## 2013-10-22 NOTE — Progress Notes (Signed)
Patient ID: [name], female   DOB: 1965-03-28, 48 y.o.   MRN: [id number] She has been up and has gone to part of the groups today., Interacting with peers and staff. Has requested and received prn med 's for c/o chronic headache/migrains. She reports that the pain medications provided little relief. Stated that she had been having migraines since she was 48 yo. Self inventory: Depression 8. Hopelessness 8, anxiety 10, SI yes at times, has contracted for safety,. Has c/o pain 8, goal : is to stay positive by talking to people. Stated she has had a h/o migraines starting age 48.   She has been joking around with peer. Her affect looks brighter this afternoon.

## 2013-10-22 NOTE — [facility] Group Notes (Signed)
[facility] LCSW Group Therapy 10/22/2013  1:15 pm  Type of Therapy: Group Therapy Participation Level: Active  Participation Quality: Attentive, Sharing and Supportive  Affect: Depressed and Flat  Cognitive: Alert and Oriented  Insight: Developing/Improving and Engaged  Engagement in Therapy: Developing/Improving and Engaged  Modes of Intervention: Clarification, Confrontation, Discussion, Education, Exploration,  Limit-setting, Orientation, Problem-solving, Rapport Building, Dance movement psychotherapisteality Testing, Socialization and Support  Summary of Progress/Problems: Pt identified obstacles faced currently and processed barriers involved in overcoming these obstacles. Pt identified steps necessary for overcoming these obstacles and explored motivation (internal and external) for facing these difficulties head on. Pt further identified one area of concern in their lives and chose a goal to focus on for today. Patient identified her obstacle as isolating herself and being afraid of relapsing. CSW provided emotional support and discussed importance of finding support system.   Samuella BruinKristin Tiffanny Lamarche, MSW, [employer] IncLCSWA Clinical Social Worker [facility] [phone]

## 2013-10-22 NOTE — Progress Notes (Signed)
Patient ID: Janet PeersChristine M Naff, female   DOB: [birth year], 48 y.o.   MRN: [id number] PER STATE REGULATIONS 482.30  THIS CHART WAS REVIEWED FOR MEDICAL NECESSITY WITH RESPECT TO THE PATIENT'S ADMISSION/ DURATION OF STAY.  NEXT REVIEW DATE: 10/26/2013  Willa RoughJENNIFER JONES Marisa Hage, RN, BSN CASE MANAGER

## 2013-10-22 NOTE — Progress Notes (Signed)
[facility] MD Progress Note  10/22/2013 4:00 PM [name]  MRN:  [id number] Subjective:  [name] states that she had a "rough weekend" can't verbalize why or how it was "rough" she states she does not feel ready to go back home. She states she pushes herself to go to groups as she is not taking how they can help for granted.  Diagnosis:   DSM5: Trauma-Stressor Disorders:  Posttraumatic Stress Disorder (309.81) Depressive Disorders:  Major Depressive Disorder - Severe (296.23) Total Time spent with patient: 30 minutes  Axis I: Generalized Anxiety Disorder  ADL's:  Intact  Sleep: Poor  Appetite:  Fair Psychiatric Specialty Exam: Physical Exam  Review of Systems  Constitutional: Positive for malaise/fatigue.  Eyes: Negative.   Respiratory: Negative.   Cardiovascular: Negative.   Gastrointestinal: Negative.   Genitourinary: Negative.   Musculoskeletal: Negative.   Skin: Negative.   Neurological: Positive for headaches.  Endo/Heme/Allergies: Negative.   Psychiatric/Behavioral: Positive for depression. The patient is nervous/anxious and has insomnia.     Blood pressure 96/50, pulse 84, temperature 98.5 F (36.9 C), temperature source Oral, resp. rate 16, height 5\' 3"  (1.6 m), weight 60.782 kg (134 lb), last menstrual period 12/04/2010.Body mass index is 23.74 kg/(m^2).  General Appearance: Fairly Groomed  Patent attorney::  Fair  Speech:  Clear and Coherent, Slow and not spontaneous  Volume:  Decreased  Mood:  Depressed  Affect:  Restricted  Thought Process:  Coherent and Goal Directed  Orientation:  Full (Time, Place, and Person)  Thought Content:  symptoms worries concerns  Suicidal Thoughts:  intermittent  Homicidal Thoughts:  No  Memory:  Immediate;   Fair Recent;   Fair Remote;   Fair  Judgement:  Fair  Insight:  Present and Shallow  Psychomotor Activity:  Decreased  Concentration:  Fair  Recall:  [employer] of Knowledge:NA  Language: Fair  Akathisia:  No   Handed:    AIMS (if indicated):     Assets:  Desire for Improvement Housing  Sleep:  Number of Hours: 4.75   Musculoskeletal: Strength & Muscle Tone: within normal limits Gait & Station: normal Patient leans: N/A  Current Medications: Current Facility-Administered Medications  Medication Dose Route Frequency Provider Last Rate Last Dose  . acetaminophen (TYLENOL) tablet 650 mg  650 mg Oral Q6H PRN Nehemiah Massed, MD      . alum & mag hydroxide-simeth (MAALOX/MYLANTA) 200-200-20 MG/5ML suspension 30 mL  30 mL Oral Q4H PRN Nehemiah Massed, MD      . amphetamine-dextroamphetamine (ADDERALL XR) 24 hr capsule 10 mg  10 mg Oral BID Rachael Fee, MD   10 mg at 10/22/13 1451  . busPIRone (BUSPAR) tablet 10 mg  10 mg Oral BID Nehemiah Massed, MD   10 mg at 10/22/13 7829  . butorphanol (STADOL) nasal spray 1 spray  1 spray Nasal Q4H PRN Rachael Fee, MD   1 spray at 10/22/13 1138  . diazepam (VALIUM) tablet 10 mg  10 mg Oral TID Rachael Fee, MD   10 mg at 10/22/13 1448  . levothyroxine (SYNTHROID, LEVOTHROID) tablet 125 mcg  125 mcg Oral QAC breakfast Nehemiah Massed, MD   125 mcg at 10/22/13 0535  . lithium carbonate capsule 300 mg  300 mg Oral BID WC Nehemiah Massed, MD   300 mg at 10/22/13 5621  . magnesium hydroxide (MILK OF MAGNESIA) suspension 30 mL  30 mL Oral Daily PRN Nehemiah Massed, MD      . morphine (MS CONTIN)  12 hr tablet 15 mg  15 mg Oral Q12H Nehemiah MassedFernando Cobos, MD   15 mg at 10/22/13 0813  . nicotine (NICODERM CQ - dosed in mg/24 hours) patch 21 mg  21 mg Transdermal Daily Nehemiah MassedFernando Cobos, MD   21 mg at 10/22/13 0815  . oxyCODONE (Oxy IR/ROXICODONE) immediate release tablet 5 mg  5 mg Oral Q6H PRN Rachael FeeIrving A Donita Newland, MD   5 mg at 10/22/13 1453  . traZODone (DESYREL) tablet 100 mg  100 mg Oral QHS PRN Nehemiah MassedFernando Cobos, MD   100 mg at 10/21/13 2300  . traZODone (DESYREL) tablet 50 mg  50 mg Oral Once Evanna Janann Augustori Burkett, NP      . venlafaxine XR (EFFEXOR-XR) 24 hr capsule 150 mg  150 mg Oral Q  breakfast Nehemiah MassedFernando Cobos, MD   150 mg at 10/22/13 [id number]    Lab Results:  Results for orders placed during the Roth encounter of 10/18/13 (from the past 48 hour(s))  LITHIUM LEVEL     Status: Abnormal   Collection Time    10/22/13  6:15 AM      Result Value Ref Range   Lithium Lvl 0.60 (*) 0.80 - 1.40 mEq/L   Comment: [facility]    Physical Findings: AIMS: Facial and Oral Movements Muscles of Facial Expression: None, normal Lips and Perioral Area: None, normal Jaw: None, normal Tongue: None, normal,Extremity Movements Upper (arms, wrists, hands, fingers): None, normal Lower (legs, knees, ankles, toes): None, normal, Trunk Movements Neck, shoulders, hips: None, normal, Overall Severity Severity of abnormal movements (highest score from questions above): None, normal Incapacitation due to abnormal movements: None, normal Patient's awareness of abnormal movements (rate only patient's report): No Awareness, Dental Status Current problems with teeth and/or dentures?: No Does patient usually wear dentures?: No  CIWA:    COWS:     Treatment Plan Summary: Daily contact with patient to assess and evaluate symptoms and progress in treatment Medication management  Plan: Supportive approach/coping skills           Will continue to try to help with mindfulness strategies with a goal of getting to depend           less on all the medications she is taking.  Medical Decision Making Problem Points:  Review of psycho-social stressors (1) Data Points:  Review of medication regiment & side effects (2)  I certify that inpatient services furnished can reasonably be expected to improve the patient's condition.   Stanislaus Kaltenbach A 10/22/2013, 4:00 PM

## 2013-10-22 NOTE — [facility] Group Notes (Signed)
   [facility] Note  10/22/2013  8:45 AM   Participation Quality: Alert, Appropriate and Oriented  Mood/Affect: Depressed and Flat  Depression Rating: 8  Anxiety Rating: 10  Thoughts of Suicide: Pt endorsing SI/HI  Will you contract for safety? CSW continuing to assess  Current AVH: Pt denies  Plan for Discharge/Comments: Pt attended discharge planning group and actively participated in group. CSW provided pt with today's workbook. Patient endorsing high levels of depression and anxiety today, as well as SI/HI. She reports that she plans to discharge home to follow up with her current outpatient providers [facility] and therapist Marline BackboneLynn Murray.  Transportation Means: Pt reports access to transportation  Supports: No supports mentioned at this time  Samuella BruinKristin Glory Graefe, MSW, [employer]CSWA Clinical Social Worker [facility] [phone]

## 2013-10-22 NOTE — Progress Notes (Signed)
Adult Psychoeducational Group Note  Date:  10/22/2013 Time:  10:00am Group Topic/Focus:  Therapeutic Activity  Participation Level:  Minimal  Participation Quality:  Appropriate and Attentive  Affect:  Appropriate  Cognitive:  Alert and Appropriate  Insight: Good  Engagement in Group:  Engaged  Modes of Intervention:  Discussion and Education  Additional Comments:  Pt attended group and discussion was on name one positive thing about yourself. Pt stated she is a respectful person.  Shelly BombardGarner, Piedad Standiford D 10/22/2013, 10:56 AM

## 2013-10-22 NOTE — Progress Notes (Signed)
D:  Pt continues to endorse SI-contracts for safety, pt stated she VH- seeing a man. Pt was told she was safe, and she replied "my psychosis tells me something else" Pt denies HI/AH. Pt is pleasant and cooperative. Pt goal for today is to work on her communication with her mother. Pt took her night meds at 11p and then was under the table in the medication room saying she was paranoid. Pt was coaxed to going into her room. Pt fell asleep 30 min later  A: Pt was offered support and encouragement. Pt was given scheduled medications. Pt was encourage to attend groups. Q 15 minute checks were done for safety.   R: Pt is taking medication.Pt receptive to treatment and safety maintained on unit.

## 2013-10-22 NOTE — Progress Notes (Signed)
D:  Pt continues to endorse SI-contracts. Pt denies HI/AVH. Pt is pleasant and cooperative. Pt says she is ready to go home tomorrow, and ready to go  A: Pt was offered support and encouragement. Pt was given scheduled medications. Pt was encourage to attend groups. Q 15 minute checks were done for safety.  R:Pt attends groups and interacts well with peers and staff. Pt is taking medication. Pt has no complaints.Pt receptive to treatment and safety maintained on unit.

## 2013-10-23 DIAGNOSIS — F4310 Post-traumatic stress disorder, unspecified: Secondary | ICD-10-CM

## 2013-10-23 DIAGNOSIS — F332 Major depressive disorder, recurrent severe without psychotic features: Secondary | ICD-10-CM

## 2013-10-23 MED ORDER — TRAZODONE HCL 100 MG PO TABS: 100.0000 mg | ORAL_TABLET | Freq: Every evening | ORAL | Status: AC | PRN

## 2013-10-23 MED ORDER — LITHIUM CARBONATE 300 MG PO CAPS: 300.0000 mg | ORAL_CAPSULE | Freq: Two times a day (BID) | ORAL | Status: AC

## 2013-10-23 MED ORDER — OXYCODONE HCL 5 MG PO TABS: 5.0000 mg | ORAL_TABLET | Freq: Four times a day (QID) | ORAL | Status: AC | PRN

## 2013-10-23 MED ORDER — AMPHETAMINE-DEXTROAMPHETAMINE 10 MG PO TABS: 10.0000 mg | ORAL_TABLET | Freq: Two times a day (BID) | ORAL | Status: AC

## 2013-10-23 MED ORDER — LEVOTHYROXINE SODIUM 125 MCG PO TABS: 125.0000 ug | ORAL_TABLET | Freq: Every day | ORAL | Status: AC

## 2013-10-23 MED ORDER — BUSPIRONE HCL 10 MG PO TABS: 10.0000 mg | ORAL_TABLET | Freq: Two times a day (BID) | ORAL | Status: AC

## 2013-10-23 MED ORDER — VENLAFAXINE HCL ER 150 MG PO CP24: 150.0000 mg | ORAL_CAPSULE | Freq: Every day | ORAL | Status: AC

## 2013-10-23 MED ORDER — DIAZEPAM 10 MG PO TABS: 10.0000 mg | ORAL_TABLET | Freq: Three times a day (TID) | ORAL | Status: AC

## 2013-10-23 MED ORDER — MORPHINE SULFATE ER 15 MG PO TBCR: 15.0000 mg | EXTENDED_RELEASE_TABLET | Freq: Two times a day (BID) | ORAL | Status: AC
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
## 2013-10-23 NOTE — [facility] Suicide Risk Assessment (Signed)
[facility] INPATIENT:  Family/Significant Other Suicide Prevention Education  Suicide Prevention Education:  Patient Refusal for Family/Significant Other Suicide Prevention Education: The patient [name] has refused to provide written consent for family/significant other to be provided Family/Significant Other Suicide Prevention Education during admission and/or prior to discharge.  Physician notified. SPE reviewed with patient and she was provided with brochure.  Bridey Brookover, West CarboKristin L 10/23/2013, 10:01 AM

## 2013-10-23 NOTE — Progress Notes (Signed)
Patient ID: [name], female   DOB: Dec 23, 1965, 48 y.o.   MRN: [id number] She has been up interacting with peers and staff. Self inventory: depression 8, hopelessness 8, anxiety 9, SI positive ( said" She just had ideal ation  That she would not harm herself) Dr. Dub MikesLugo informed.

## 2013-10-23 NOTE — Progress Notes (Signed)
[facility] :  Will you be returning to the same living situation after discharge: Yes,  patient will be returning home At discharge, do you have transportation home?:Yes,  patient's mother will pick her up Do you have the ability to pay for your medications:Yes,  patient will be provided prescriptions and verbalized her ability to obtain her medications.  Release of information consent forms completed and in the chart;  Patient's signature needed at discharge.  Patient to Follow up at: Follow-up Information   Follow up with Marline BackboneLynn Murray On 10/30/2013. (Therapy appointment at 12 pm. Please call office if you need to reschedule.)    Contact information:   [address] [place], [place] [address]   Phone: [phone]  Fax: [phone]      [facility] [place] On 10/24/2013. [facility](Hospital Follow-up appointment at 3 pm with counselor Toni Amendourtney. Please call office if you need to reschedule.)    Contact information:   Phone: [phone]  Fax: [phone]  Address: [address], [place], [place] [address]      Patient denies SI/HI:   Yes,  patient endorses passive SI but feels safe for discharge and can contract for safety    Safety Planning and Suicide Prevention discussed:  Yes,  with patient.  Jaylise Peek, [place] CarboKristin L 10/23/2013, 10:05 AM

## 2013-10-23 NOTE — Tx Team (Signed)
Interdisciplinary Treatment Plan Update (Adult) Date: 10/23/2013   Time Reviewed: 9:30 AM  Progress in Treatment: Attending groups: Yes Participating in groups: Yes Taking medication as prescribed: Yes Tolerating medication: Yes Family/Significant other contact made: No, CSW continuing to assess for appropriate collateral contact Patient understands diagnosis: Yes Discussing patient identified problems/goals with staff: Yes Medical problems stabilized or resolved: Yes Denies suicidal/homicidal ideation: No, patient endorsing SI/HI at this time Issues/concerns per patient self-inventory: Yes Other:  New problem(s) identified: N/A  Discharge Plan or Barriers: [place] And will have her mother pick her up at discharge. [facility] [place] and [facility] for outpatient services. Discharge anticipated for Tuesday 10/23/13.  Reason for Continuation of Hospitalization:  Depression Anxiety Medication Stabilization   Comments: N/A  Estimated length of stay: Discharge anticipated for today 10/23/13  For review of initial/current patient goals, please see plan of care.  Attendees: Patient:    Family:    Physician: Dr. Jama Flavorsobos; Dr. Dub MikesLugo 10/23/2013 9:30 AM  Nursing: Leighton ParodyBritney Tyson; [place] Minersonna Shimp,  RN 10/23/2013 9:30 AM  Clinical Social Worker: Samuella BruinKristin Zair Borawski,  LCSWA 10/23/2013 9:30 AM  Other: Juline PatchQuylle Hodnett, LCSW 10/23/2013 9:30 AM  Other: Leisa LenzValerie Enoch, [facility] Liaison 10/23/2013 9:30 AM  Other: Onnie BoerJennifer Clark, Case Manager 10/23/2013 9:30 AM  Other:  10/23/2013 9:30 AM  Other:  10/23/2013 9:30 AM  Other:    Other:    Other:    Other:        Scribe for Treatment Team:  Samuella BruinKristin Garrett Bowring, MSW, [employer]CSWA [phone]

## 2013-10-23 NOTE — Progress Notes (Signed)
Patient ID: [name], female   DOB: 16-Jun-1965, 48 y.o.   MRN: [id number] She has been discharged home and her mother picked her up. She voiced understanding of discharge instruction about medications and of follow up plan. All belongs were taken home with her. On self inventory sheet she marked having SI thoughts When asked she stated" I have ideations of SI but I would not harm myself. Dr. Dub MikesLugo made aware of her positive SI thoughts and he  Said to chart what she said. See above.
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
## 2013-10-23 NOTE — Progress Notes (Signed)
Patient ID: Janet PeersChristine M Roth, female   DOB: [birth year], 48 y.o.   MRN: [id number] She was discharged home and was picked up by her aunt. She voiced understanding of discharge teaching and of the follow up care. She denies thoughts of SI and all belonging were taken home with her.

## 2013-10-23 NOTE — Plan of Care (Signed)
Problem: Alteration in mood Goal: LTG-Patient reports reduction in suicidal thoughts (Patient reports reduction in suicidal thoughts and is able to verbalize a safety plan for whenever patient is feeling suicidal)  Outcome: Not Progressing Pt SI-but contracts Goal: STG-Patient reports thoughts of self-harm to staff Outcome: Progressing Pt stated she still feels like cutting

## 2013-10-23 NOTE — Progress Notes (Signed)
The focus of this group is to educate the patient on the purpose and policies of crisis stabilization and provide a format to answer questions about their admission.  The group details unit policies and expectations of patients while admitted. Patient attended this group. 

## 2013-10-26 NOTE — Progress Notes (Addendum)
Patient Discharge Instructions:  After Visit Summary (AVS):   Faxed to:  10/26/13 Discharge Summary Note:   Faxed to:  10/26/13 Psychiatric Admission Assessment Note:   Faxed to:  10/26/13 Suicide Risk Assessment - Discharge Assessment:   Faxed to:  10/26/13 Faxed/Sent to the Next Level Care provider:  10/26/13 Faxed to [facility] @ [phone] [facility] @ [phone]  Jerelene ReddenSheena E Thynedale, 10/26/2013, 1:52 PM

## 2013-12-23 ENCOUNTER — Encounter (HOSPITAL_COMMUNITY): Payer: Self-pay | Admitting: *Deleted

## 2013-12-23 ENCOUNTER — Emergency Department (HOSPITAL_COMMUNITY)
Admission: EM | Admit: 2013-12-23 | Discharge: 2013-12-23 | Disposition: A | Discharge status: Home / Self Care | Payer: Medicare Other | Attending: Emergency Medicine | Admitting: Emergency Medicine

## 2013-12-23 DIAGNOSIS — G43909 Migraine, unspecified, not intractable, without status migrainosus: Secondary | ICD-10-CM | POA: Diagnosis not present

## 2013-12-23 DIAGNOSIS — F419 Anxiety disorder, unspecified: Secondary | ICD-10-CM | POA: Insufficient documentation

## 2013-12-23 DIAGNOSIS — Z79899 Other long term (current) drug therapy: Secondary | ICD-10-CM | POA: Insufficient documentation

## 2013-12-23 DIAGNOSIS — Z8639 Personal history of other endocrine, nutritional and metabolic disease: Secondary | ICD-10-CM | POA: Diagnosis not present

## 2013-12-23 DIAGNOSIS — Z720 Tobacco use: Secondary | ICD-10-CM | POA: Diagnosis not present

## 2013-12-23 DIAGNOSIS — F4310 Post-traumatic stress disorder, unspecified: Secondary | ICD-10-CM | POA: Diagnosis not present

## 2013-12-23 DIAGNOSIS — G43009 Migraine without aura, not intractable, without status migrainosus: Secondary | ICD-10-CM

## 2013-12-23 DIAGNOSIS — F902 Attention-deficit hyperactivity disorder, combined type: Secondary | ICD-10-CM | POA: Diagnosis not present

## 2013-12-23 DIAGNOSIS — F329 Major depressive disorder, single episode, unspecified: Secondary | ICD-10-CM | POA: Diagnosis not present

## 2013-12-23 DIAGNOSIS — R1110 Vomiting, unspecified: Secondary | ICD-10-CM | POA: Diagnosis present

## 2013-12-23 MED ORDER — PROMETHAZINE HCL 25 MG/ML IJ SOLN
25.0000 mg | Freq: Once | INTRAMUSCULAR | Status: AC
  Administered 2013-12-23: 25 mg via INTRAVENOUS
  Filled 2013-12-23: qty 1

## 2013-12-23 MED ORDER — HYDROMORPHONE HCL 1 MG/ML IJ SOLN
1.0000 mg | Freq: Once | INTRAMUSCULAR | Status: AC
Start: 2013-12-23 — End: 2013-12-23
  Administered 2013-12-23: 1 mg via INTRAVENOUS
  Filled 2013-12-23: qty 1

## 2013-12-23 MED ORDER — DIPHENHYDRAMINE HCL 50 MG/ML IJ SOLN
25.0000 mg | Freq: Once | INTRAMUSCULAR | Status: AC
  Administered 2013-12-23: 25 mg via INTRAVENOUS
  Filled 2013-12-23: qty 1

## 2013-12-23 MED ORDER — SODIUM CHLORIDE 0.9 % IV BOLUS (SEPSIS)
1000.0000 mL | Freq: Once | INTRAVENOUS | Status: AC
  Administered 2013-12-23: 1000 mL via INTRAVENOUS

## 2013-12-23 MED ORDER — HYDROMORPHONE HCL 1 MG/ML IJ SOLN
1.0000 mg | Freq: Once | INTRAMUSCULAR | Status: AC
  Administered 2013-12-23: 1 mg via INTRAVENOUS
  Filled 2013-12-23: qty 1

## 2013-12-23 NOTE — Discharge Instructions (Signed)

## 2013-12-23 NOTE — ED Provider Notes (Signed)
TIME SEEN: 2:16 PM  CHIEF COMPLAINT: Migraine headache  HPI: Pt is a 48 y.o. F with history of migraine headaches, anxiety, PTSD, depression who presents emergency department with complaints of a throbbing diffuse headache that she states feels similar to her prior migraines. She states she takes Opana at home for her migraine headaches. She reports that she is taking medicine at home without relief. She has had nausea, vomiting. Headache worse with lights and sounds. No numbness, tingling or focal weakness. No fever, neck pain or neck stiffness. No head injury. Not on anticoagulation. She states the only thing that helps for her pain is Dilaudid, Benadryl and Phenergan. She is allergic to Toradol, morphine, Compazine, Reglan, steroids.  ROS: See HPI Constitutional: no fever  Eyes: no drainage  ENT: no runny nose   Cardiovascular:  no chest pain  Resp: no SOB  GI: no vomiting GU: no dysuria Integumentary: no rash  Allergy: no hives  Musculoskeletal: no leg swelling  Neurological: no slurred speech ROS otherwise negative  PAST MEDICAL HISTORY/PAST SURGICAL HISTORY:  Past Medical History  Diagnosis Date  . Migraine   . PTSD (post-traumatic stress disorder)   . Anxiety   . Personality disorder   . Depression   . High cholesterol   . Hypothyroid   . ADHD (attention deficit hyperactivity disorder)     MEDICATIONS:  Prior to Admission medications   Medication Sig Start Date End Date Taking? Authorizing Provider  amphetamine-dextroamphetamine (ADDERALL) 10 MG tablet Take 1 tablet (10 mg total) by mouth 2 (two) times daily. For ADHD 10/23/13   Sanjuana KavaAgnes I Nwoko, NP  busPIRone (BUSPAR) 10 MG tablet Take 1 tablet (10 mg total) by mouth 2 (two) times daily. For anxiety 10/23/13   Sanjuana KavaAgnes I Nwoko, NP  diazepam (VALIUM) 10 MG tablet Take 1 tablet (10 mg total) by mouth 3 (three) times daily. For anxiety 10/23/13   Sanjuana KavaAgnes I Nwoko, NP  levothyroxine (SYNTHROID, LEVOTHROID) 125 MCG tablet Take 1 tablet  (125 mcg total) by mouth daily before breakfast. For low thyroid function 10/23/13   Sanjuana KavaAgnes I Nwoko, NP  lithium carbonate 300 MG capsule Take 1 capsule (300 mg total) by mouth 2 (two) times daily with a meal. For mood stabilization 10/23/13   Sanjuana KavaAgnes I Nwoko, NP  morphine (MS CONTIN) 15 MG 12 hr tablet Take 1 tablet (15 mg total) by mouth every 12 (twelve) hours. For severe pain 10/23/13   Sanjuana KavaAgnes I Nwoko, NP  oxyCODONE (OXY IR/ROXICODONE) 5 MG immediate release tablet Take 1 tablet (5 mg total) by mouth every 6 (six) hours as needed for severe pain or breakthrough pain. 10/23/13   Sanjuana KavaAgnes I Nwoko, NP  traZODone (DESYREL) 100 MG tablet Take 1 tablet (100 mg total) by mouth at bedtime as needed for sleep. For sleep 10/23/13   Sanjuana KavaAgnes I Nwoko, NP  venlafaxine XR (EFFEXOR-XR) 150 MG 24 hr capsule Take 1 capsule (150 mg total) by mouth daily with breakfast. For depression 10/23/13   Sanjuana KavaAgnes I Nwoko, NP    ALLERGIES:  Allergies  Allergen Reactions  . Compazine Rash  . Geodon [Ziprasidone Hydrochloride] Rash  . Imitrex [Sumatriptan Base] Rash  . Metoclopramide Hcl Rash  . Other Rash and Other (See Comments)    All steroids: "makes me crazy"  . Penicillins Itching and Rash  . Toradol [Ketorolac Tromethamine] Hives  . Wellbutrin [Bupropion Hcl] Rash  . Zofran Rash    SOCIAL HISTORY:  History  Substance Use Topics  . Smoking status: Current  Every Day Smoker -- 1.00 packs/day    Types: Cigarettes  . Smokeless tobacco: Not on file  . Alcohol Use: No    FAMILY HISTORY: History reviewed. No pertinent family history.  EXAM: BP 106/67 mmHg  Pulse 105  Temp(Src) 98.8 F (37.1 C)  Resp 18  Ht 5\' 3"  (1.6 m)  Wt 130 lb (58.968 kg)  BMI 23.03 kg/m2  SpO2 99%  LMP 12/04/2010 CONSTITUTIONAL: Alert and oriented and responds appropriately to questions. Well-appearing; well-nourished HEAD: Normocephalic EYES: Conjunctivae clear, PERRL ENT: normal nose; no rhinorrhea; moist mucous membranes; pharynx without  lesions noted NECK: Supple, no meningismus, no LAD  CARD: RRR; S1 and S2 appreciated; no murmurs, no clicks, no rubs, no gallops RESP: Normal chest excursion without splinting or tachypnea; breath sounds clear and equal bilaterally; no wheezes, no rhonchi, no rales,  ABD/GI: Normal bowel sounds; non-distended; soft, non-tender, no rebound, no guarding BACK:  The back appears normal and is non-tender to palpation, there is no CVA tenderness EXT: Normal ROM in all joints; non-tender to palpation; no edema; normal capillary refill; no cyanosis    SKIN: Normal color for age and race; warm NEURO: Moves all extremities equally, sensation to light touch intact diffusely, cranial nerves II through XII intact, normal gait PSYCH: The patient's mood and manner are appropriate. Grooming and personal hygiene are appropriate.  MEDICAL DECISION MAKING: Patient here with her typical migraine headache. We'll attempt to control symptoms. I do not feel she needs head imaging. We'll give Dilaudid, Phenergan, Benadryl, IV fluids.  ED PROGRESS: Patient reports that she is having some improvement of her headache. She states it normally takes "3 rounds of pain medication to get my pain under control". Discussed with patient that she is very well-appearing and I will give her 1 more round of Dilaudid and Phenergan but I feel she can be discharged. She is comfortable with this plan. She has follow-up with her PCP in 4 days.     Layla MawKristen N Lexiana Spindel, DO 12/23/13 1624

## 2013-12-23 NOTE — ED Notes (Signed)
Pt has hx of migraines, reports severe headache and n/v since last night, sensitivity to light.

## 2013-12-25 ENCOUNTER — Encounter (HOSPITAL_COMMUNITY): Payer: Self-pay | Admitting: Emergency Medicine

## 2013-12-25 ENCOUNTER — Emergency Department (HOSPITAL_COMMUNITY)
Admission: EM | Admit: 2013-12-25 | Discharge: 2013-12-25 | Disposition: A | Discharge status: Home / Self Care | Payer: Medicare Other | Attending: Emergency Medicine | Admitting: Emergency Medicine

## 2013-12-25 DIAGNOSIS — E039 Hypothyroidism, unspecified: Secondary | ICD-10-CM | POA: Insufficient documentation

## 2013-12-25 DIAGNOSIS — R519 Headache, unspecified: Secondary | ICD-10-CM

## 2013-12-25 DIAGNOSIS — F329 Major depressive disorder, single episode, unspecified: Secondary | ICD-10-CM | POA: Insufficient documentation

## 2013-12-25 DIAGNOSIS — G43909 Migraine, unspecified, not intractable, without status migrainosus: Secondary | ICD-10-CM | POA: Diagnosis present

## 2013-12-25 DIAGNOSIS — Z79899 Other long term (current) drug therapy: Secondary | ICD-10-CM | POA: Diagnosis not present

## 2013-12-25 DIAGNOSIS — F419 Anxiety disorder, unspecified: Secondary | ICD-10-CM | POA: Diagnosis not present

## 2013-12-25 DIAGNOSIS — Z880 Allergy status to penicillin: Secondary | ICD-10-CM | POA: Insufficient documentation

## 2013-12-25 DIAGNOSIS — R51 Headache: Secondary | ICD-10-CM

## 2013-12-25 DIAGNOSIS — Z720 Tobacco use: Secondary | ICD-10-CM | POA: Insufficient documentation

## 2013-12-25 DIAGNOSIS — R110 Nausea: Secondary | ICD-10-CM | POA: Diagnosis not present

## 2013-12-25 DIAGNOSIS — F909 Attention-deficit hyperactivity disorder, unspecified type: Secondary | ICD-10-CM | POA: Diagnosis not present

## 2013-12-25 MED ORDER — HYDROMORPHONE HCL 1 MG/ML IJ SOLN
1.0000 mg | Freq: Once | INTRAMUSCULAR | Status: AC
  Administered 2013-12-25: 1 mg via INTRAVENOUS
  Filled 2013-12-25: qty 1

## 2013-12-25 MED ORDER — DIPHENHYDRAMINE HCL 50 MG/ML IJ SOLN
25.0000 mg | Freq: Once | INTRAMUSCULAR | Status: AC
  Administered 2013-12-25: 25 mg via INTRAVENOUS
  Filled 2013-12-25: qty 1

## 2013-12-25 MED ORDER — SODIUM CHLORIDE 0.9 % IV BOLUS (SEPSIS)
500.0000 mL | Freq: Once | INTRAVENOUS | Status: AC
  Administered 2013-12-25: 500 mL via INTRAVENOUS

## 2013-12-25 MED ORDER — PROMETHAZINE HCL 25 MG/ML IJ SOLN
25.0000 mg | Freq: Once | INTRAMUSCULAR | Status: AC
  Administered 2013-12-25: 25 mg via INTRAVENOUS
  Filled 2013-12-25: qty 1

## 2013-12-25 NOTE — ED Notes (Signed)
PT ASKED FOR MEDS TO BE GIVEN FAST W/O BEING DILUTED W/NS X 2. RN ADVISED PT NO. PT THEN ASKED IF THEY MAY BE ADMINISTERED IN THE HUB CLOSEST TO THE IV SITE - DILAUDID DILUTED W/9ML NS AND BENADRYL DILUTED W/9ML NS GIVEN AT THIS SITE. PHENERGAN GIVEN AT UPPER HUB.

## 2013-12-25 NOTE — ED Notes (Signed)
MD at the bedside  

## 2013-12-25 NOTE — ED Notes (Signed)
Pt c/o generalized HA x 2 days with N/V that is like typical migraine for pt; pt seen here multiple times in past for same

## 2013-12-25 NOTE — ED Provider Notes (Signed)
CSN: [id number]     Arrival date & time 12/25/13  1442 History   First MD Initiated Contact with Patient 12/25/13 1634     Chief Complaint  Patient presents with  . Headache     (Consider location/radiation/quality/duration/timing/severity/associated sxs/prior Treatment) HPI [name] is a 48 y.o. female with a history of chronic migraines, anxiety, psychiatric issues comes in for evaluation of migraine. Patient states this migraine is typical of her chronic migraines. Started earlier today, feels like a vice grip around her head and runs down the back of her neck. She reports taking Opana at home without relief. She has associated nausea without vomiting, photophobia and phonophobia. Reports pain is a 10 out of 10 at this time but combination of Dilaudid, Phenergan and Benadryl brings this pain down to a 1. She reports having a follow-up appointment with her primary care, Dr. Hurley Cisco on Friday at 10:00. She denies any chest pain, shortness of breath, numbness or weakness, neck stiffness.   Past Medical History  Diagnosis Date  . Migraine   . PTSD (post-traumatic stress disorder)   . Anxiety   . Personality disorder   . Depression   . High cholesterol   . Hypothyroid   . ADHD (attention deficit hyperactivity disorder)    Past Surgical History  Procedure Laterality Date  . Cholecystectomy    . Mandible fracture surgery     History reviewed. No pertinent family history. History  Substance Use Topics  . Smoking status: Current Every Day Smoker -- 1.00 packs/day    Types: Cigarettes  . Smokeless tobacco: Not on file  . Alcohol Use: No   OB History    No data available     Review of Systems  Constitutional: Negative for fever.  HENT: Negative for sore throat.   Eyes: Negative for visual disturbance.  Respiratory: Negative for shortness of breath.   Cardiovascular: Negative for chest pain.  Gastrointestinal: Positive for nausea. Negative for abdominal pain.  Endocrine:  Negative for polyuria.  Genitourinary: Negative for dysuria.  Skin: Negative for rash.  Neurological: Positive for headaches.      Allergies  Compazine; Geodon; Imitrex; Metoclopramide hcl; Other; Penicillins; Toradol; Wellbutrin; and Zofran  Home Medications   Prior to Admission medications   Medication Sig Start Date End Date Taking? Authorizing Provider  amphetamine-dextroamphetamine (ADDERALL) 10 MG tablet Take 1 tablet (10 mg total) by mouth 2 (two) times daily. For ADHD 10/23/13  Yes Sanjuana Kava, NP  butorphanol (STADOL) 10 MG/ML nasal spray Place 1 spray into the nose every 4 (four) hours as needed for headache.   Yes Historical Provider, MD  diazepam (VALIUM) 10 MG tablet Take 1 tablet (10 mg total) by mouth 3 (three) times daily. For anxiety 10/23/13  Yes Sanjuana Kava, NP  levothyroxine (SYNTHROID, LEVOTHROID) 125 MCG tablet Take 1 tablet (125 mcg total) by mouth daily before breakfast. For low thyroid function 10/23/13  Yes Sanjuana Kava, NP  lithium carbonate 300 MG capsule Take 1 capsule (300 mg total) by mouth 2 (two) times daily with a meal. For mood stabilization 10/23/13  Yes Sanjuana Kava, NP  oxycodone (OXY-IR) 5 MG capsule Take 5 mg by mouth every 6 (six) hours as needed for pain.   Yes Historical Provider, MD  oxymorphone (OPANA ER) 15 MG 12 hr tablet Take 15 mg by mouth every 12 (twelve) hours.   Yes Historical Provider, MD  traZODone (DESYREL) 100 MG tablet Take 1 tablet (100 mg total) by  mouth at bedtime as needed for sleep. For sleep Patient taking differently: Take 300 mg by mouth at bedtime as needed for sleep. For sleep 10/23/13  Yes Sanjuana KavaAgnes I Nwoko, NP  venlafaxine XR (EFFEXOR-XR) 150 MG 24 hr capsule Take 1 capsule (150 mg total) by mouth daily with breakfast. For depression Patient taking differently: Take 150 mg by mouth 2 (two) times daily. For depression 10/23/13  Yes Sanjuana KavaAgnes I Nwoko, NP  busPIRone (BUSPAR) 10 MG tablet Take 1 tablet (10 mg total) by mouth 2 (two)  times daily. For anxiety Patient not taking: Reported on 12/25/2013 10/23/13   Sanjuana KavaAgnes I Nwoko, NP  morphine (MS CONTIN) 15 MG 12 hr tablet Take 1 tablet (15 mg total) by mouth every 12 (twelve) hours. For severe pain Patient not taking: Reported on 12/25/2013 10/23/13   Sanjuana KavaAgnes I Nwoko, NP  oxyCODONE (OXY IR/ROXICODONE) 5 MG immediate release tablet Take 1 tablet (5 mg total) by mouth every 6 (six) hours as needed for severe pain or breakthrough pain. Patient not taking: Reported on 12/25/2013 10/23/13   Sanjuana KavaAgnes I Nwoko, NP   BP 124/86 mmHg  Pulse 75  Temp(Src) 99 F (37.2 C) (Oral)  Resp 17  Ht 5\' 3"  (1.6 m)  Wt 130 lb (58.968 kg)  BMI 23.03 kg/m2  SpO2 96%  LMP 12/04/2010 Physical Exam  Constitutional: She is oriented to person, place, and time. She appears well-developed and well-nourished.  HENT:  Head: Normocephalic and atraumatic.  Mouth/Throat: Oropharynx is clear and moist.  Eyes: Conjunctivae and EOM are normal. Pupils are equal, round, and reactive to light. Right eye exhibits no discharge. Left eye exhibits no discharge. No scleral icterus.  Neck: Neck supple.  No meningeal signs  Cardiovascular: Normal rate, regular rhythm and normal heart sounds.   Pulmonary/Chest: Effort normal and breath sounds normal. No respiratory distress. She has no wheezes. She has no rales.  Abdominal: Soft. There is no tenderness.  Musculoskeletal: She exhibits no tenderness.  Neurological: She is alert and oriented to person, place, and time.  Cranial Nerves II-XII grossly intact. No focal neurodeficits. Patient appears to be mentating at her baseline at this time. Gait baseline without ataxia  Skin: Skin is warm and dry. No rash noted.  Psychiatric: She has a normal mood and affect.  Nursing note and vitals reviewed.   ED Course  Procedures (including critical care time) Labs Review Labs Reviewed - No data to display  Imaging Review No results found.   EKG Interpretation None     Meds  given in ED:  Medications  sodium chloride 0.9 % bolus 500 mL (0 mLs Intravenous Stopped 12/25/13 1829)  HYDROmorphone (DILAUDID) injection 1 mg (1 mg Intravenous Given 12/25/13 1735)  promethazine (PHENERGAN) injection 25 mg (25 mg Intravenous Given 12/25/13 1729)  diphenhydrAMINE (BENADRYL) injection 25 mg (25 mg Intravenous Given 12/25/13 1726)  diphenhydrAMINE (BENADRYL) injection 25 mg (25 mg Intravenous Given 12/25/13 1940)  HYDROmorphone (DILAUDID) injection 1 mg (1 mg Intravenous Given 12/25/13 1940)  promethazine (PHENERGAN) injection 25 mg (25 mg Intravenous Given 12/25/13 1940)    Discharge Medication List as of 12/25/2013  8:16 PM     Filed Vitals:   12/25/13 1815 12/25/13 1830 12/25/13 1915 12/25/13 2023  BP: 110/58   124/86  Pulse: 95 85 80 75  Temp:      TempSrc:      Resp: 18   17  Height:      Weight:      SpO2: 96% 96%  95% 96%    MDM  [name] is a 48 y.o. female with a history of chronic migraine comes in for evaluation of acute migraine.  Vitals stable - WNL -afebrile Pt resting comfortably in ED. migraine cocktail resolved headache. PE--not concerning further acute or emergent pathology. No meningismus.  Normal neuro exam  Low concern at this point for any other emergent causes for headache. Doubt SAH, CVA or other ICH. No evidence of meningitis. No evidence of vascular compromise. Migraine follows her typical pattern.   Discussed the need for her to follow-up with her primary care doctor, Dr. land on Friday at 10 AM for her regularly scheduled appointment in order to get her pain under control. Patient displays aberrent drug behavior in the ED with nurses during administration of analgesia. Discussed f/u with PCP and return precautions, pt very amenable to plan. Patient stable, in good condition and is appropriate for discharge  Prior to patient discharge, I discussed and reviewed this case with Dr.Zavitz    Final diagnoses:  Acute nonintractable  headache, unspecified headache type       Sharlene MottsBenjamin W Amit Meloy, PA-C 12/26/13 1313  Enid SkeensJoshua M Zavitz, MD 12/31/13 [phone]

## 2013-12-25 NOTE — Discharge Instructions (Signed)
You were evaluated today in the ED for your migraine. There does not appear to be an emergent cause for your symptoms. Please follow-up with your primary care for your regularly scheduled appointment on Friday. Return to ED for worsening symptoms.

## 2013-12-25 NOTE — ED Notes (Signed)
Pt. Left with all belongings 

## 2014-02-17 ENCOUNTER — Encounter (HOSPITAL_COMMUNITY): Payer: Self-pay | Admitting: Physical Medicine and Rehabilitation

## 2014-02-17 ENCOUNTER — Emergency Department (HOSPITAL_COMMUNITY): Payer: Medicare Other

## 2014-02-17 ENCOUNTER — Emergency Department (HOSPITAL_COMMUNITY)
Admission: EM | Admit: 2014-02-17 | Discharge: 2014-02-17 | Disposition: A | Discharge status: Home / Self Care | Payer: Medicare Other | Attending: Emergency Medicine | Admitting: Emergency Medicine

## 2014-02-17 DIAGNOSIS — R51 Headache: Secondary | ICD-10-CM | POA: Diagnosis present

## 2014-02-17 DIAGNOSIS — Z79899 Other long term (current) drug therapy: Secondary | ICD-10-CM | POA: Diagnosis not present

## 2014-02-17 DIAGNOSIS — F909 Attention-deficit hyperactivity disorder, unspecified type: Secondary | ICD-10-CM | POA: Insufficient documentation

## 2014-02-17 DIAGNOSIS — G43909 Migraine, unspecified, not intractable, without status migrainosus: Secondary | ICD-10-CM | POA: Diagnosis not present

## 2014-02-17 DIAGNOSIS — F419 Anxiety disorder, unspecified: Secondary | ICD-10-CM | POA: Insufficient documentation

## 2014-02-17 DIAGNOSIS — F329 Major depressive disorder, single episode, unspecified: Secondary | ICD-10-CM | POA: Diagnosis not present

## 2014-02-17 DIAGNOSIS — Z720 Tobacco use: Secondary | ICD-10-CM | POA: Insufficient documentation

## 2014-02-17 DIAGNOSIS — Z880 Allergy status to penicillin: Secondary | ICD-10-CM | POA: Insufficient documentation

## 2014-02-17 DIAGNOSIS — E039 Hypothyroidism, unspecified: Secondary | ICD-10-CM | POA: Diagnosis not present

## 2014-02-17 DIAGNOSIS — G43009 Migraine without aura, not intractable, without status migrainosus: Secondary | ICD-10-CM

## 2014-02-17 MED ORDER — DIPHENHYDRAMINE HCL 50 MG/ML IJ SOLN
12.5000 mg | Freq: Once | INTRAMUSCULAR | Status: AC
  Administered 2014-02-17: 12.5 mg via INTRAVENOUS
  Filled 2014-02-17: qty 1

## 2014-02-17 MED ORDER — SODIUM CHLORIDE 0.9 % IV BOLUS (SEPSIS)
1000.0000 mL | Freq: Once | INTRAVENOUS | Status: AC
  Administered 2014-02-17: 1000 mL via INTRAVENOUS

## 2014-02-17 MED ORDER — DIPHENHYDRAMINE HCL 50 MG/ML IJ SOLN
25.0000 mg | Freq: Once | INTRAMUSCULAR | Status: AC
  Administered 2014-02-17: 25 mg via INTRAVENOUS
  Filled 2014-02-17: qty 1

## 2014-02-17 MED ORDER — HYDROMORPHONE HCL 1 MG/ML IJ SOLN
1.0000 mg | Freq: Once | INTRAMUSCULAR | Status: AC
  Administered 2014-02-17: 1 mg via INTRAVENOUS
  Filled 2014-02-17: qty 1

## 2014-02-17 MED ORDER — KETOROLAC TROMETHAMINE 30 MG/ML IJ SOLN
30.0000 mg | Freq: Once | INTRAMUSCULAR | Status: AC
  Administered 2014-02-17: 30 mg via INTRAVENOUS
  Filled 2014-02-17: qty 1

## 2014-02-17 MED ORDER — PROMETHAZINE HCL 25 MG/ML IJ SOLN
25.0000 mg | Freq: Once | INTRAMUSCULAR | Status: AC
  Administered 2014-02-17: 25 mg via INTRAVENOUS
  Filled 2014-02-17: qty 1

## 2014-02-17 NOTE — Discharge Instructions (Signed)

## 2014-02-17 NOTE — ED Notes (Addendum)
Pt visitor out at desk asking if patient can have Ativan.

## 2014-02-17 NOTE — ED Notes (Signed)
Pt presents to department for evaluation of headache, onset yesterday, states nausea/vomiting, blurred vision and photosensitivity. Also states she tripped and fell on rock in yard yesterday, abrasion noted to R head, denies LOC. Pt is alert and oriented x4.

## 2014-02-17 NOTE — ED Notes (Signed)
Pt complaining about how the only things she want is Ativan and Dilaudid. Pt told she does not have to have these meds ordered but patient said she will go ahead and get them.

## 2014-02-17 NOTE — ED Notes (Signed)
EDP at bedside  

## 2014-02-17 NOTE — ED Provider Notes (Signed)
CSN: [id number]     Arrival date & time 02/17/14  1222 History   First MD Initiated Contact with Patient 02/17/14 1523     Chief Complaint  Patient presents with  . Headache     (Consider location/radiation/quality/duration/timing/severity/associated sxs/prior Treatment) Patient is a 49 y.o. female presenting with migraines. The history is provided by the patient.  Migraine This is a recurrent problem. The current episode started today. The problem occurs constantly. The problem has been gradually worsening. Associated symptoms include headaches and nausea. Pertinent negatives include no abdominal pain, chest pain, chills, congestion, coughing, fever, myalgias, neck pain, numbness, rash, sore throat, vertigo, visual change, vomiting or weakness. Exacerbated by: light/sound. She has tried nothing for the symptoms. The treatment provided no relief.  HA today is same as previous migraines. Gradual onset. No neck pain.  Past Medical History  Diagnosis Date  . Migraine   . PTSD (post-traumatic stress disorder)   . Anxiety   . Personality disorder   . Depression   . High cholesterol   . Hypothyroid   . ADHD (attention deficit hyperactivity disorder)    Past Surgical History  Procedure Laterality Date  . Cholecystectomy    . Mandible fracture surgery     No family history on file. History  Substance Use Topics  . Smoking status: Current Every Day Smoker -- 1.00 packs/day    Types: Cigarettes  . Smokeless tobacco: Not on file  . Alcohol Use: No   OB History    No data available     Review of Systems  Constitutional: Negative for fever and chills.  HENT: Negative for congestion, rhinorrhea and sore throat.   Eyes: Negative for visual disturbance.  Respiratory: Negative for cough and shortness of breath.   Cardiovascular: Negative for chest pain, palpitations and leg swelling.  Gastrointestinal: Positive for nausea. Negative for vomiting, abdominal pain, diarrhea and  constipation.  Genitourinary: Negative for dysuria, hematuria, vaginal bleeding and vaginal discharge.  Musculoskeletal: Negative for myalgias, back pain and neck pain.  Skin: Negative for rash.  Neurological: Positive for headaches. Negative for dizziness, vertigo, seizures, weakness, light-headedness and numbness.  All other systems reviewed and are negative.     Allergies  Compazine; Geodon; Imitrex; Metoclopramide hcl; Other; Penicillins; Toradol; Wellbutrin; and Zofran  Home Medications   Prior to Admission medications   Medication Sig Start Date End Date Taking? Authorizing Provider  amphetamine-dextroamphetamine (ADDERALL) 10 MG tablet Take 1 tablet (10 mg total) by mouth 2 (two) times daily. For ADHD 10/23/13   Sanjuana Kava, NP  busPIRone (BUSPAR) 10 MG tablet Take 1 tablet (10 mg total) by mouth 2 (two) times daily. For anxiety Patient not taking: Reported on 12/25/2013 10/23/13   Sanjuana Kava, NP  butorphanol (STADOL) 10 MG/ML nasal spray Place 1 spray into the nose every 4 (four) hours as needed for headache.    Historical Provider, MD  diazepam (VALIUM) 10 MG tablet Take 1 tablet (10 mg total) by mouth 3 (three) times daily. For anxiety 10/23/13   Sanjuana Kava, NP  levothyroxine (SYNTHROID, LEVOTHROID) 125 MCG tablet Take 1 tablet (125 mcg total) by mouth daily before breakfast. For low thyroid function 10/23/13   Sanjuana Kava, NP  lithium carbonate 300 MG capsule Take 1 capsule (300 mg total) by mouth 2 (two) times daily with a meal. For mood stabilization 10/23/13   Sanjuana Kava, NP  morphine (MS CONTIN) 15 MG 12 hr tablet Take 1 tablet (15 mg total) by  mouth every 12 (twelve) hours. For severe pain Patient not taking: Reported on 12/25/2013 10/23/13   Sanjuana Kava, NP  oxyCODONE (OXY IR/ROXICODONE) 5 MG immediate release tablet Take 1 tablet (5 mg total) by mouth every 6 (six) hours as needed for severe pain or breakthrough pain. Patient not taking: Reported on 12/25/2013  10/23/13   Sanjuana Kava, NP  oxycodone (OXY-IR) 5 MG capsule Take 5 mg by mouth every 6 (six) hours as needed for pain.    Historical Provider, MD  oxymorphone (OPANA ER) 15 MG 12 hr tablet Take 15 mg by mouth every 12 (twelve) hours.    Historical Provider, MD  traZODone (DESYREL) 100 MG tablet Take 1 tablet (100 mg total) by mouth at bedtime as needed for sleep. For sleep Patient taking differently: Take 300 mg by mouth at bedtime as needed for sleep. For sleep 10/23/13   Sanjuana Kava, NP  venlafaxine XR (EFFEXOR-XR) 150 MG 24 hr capsule Take 1 capsule (150 mg total) by mouth daily with breakfast. For depression Patient taking differently: Take 150 mg by mouth 2 (two) times daily. For depression 10/23/13   Sanjuana Kava, NP   BP 105/71 mmHg  Pulse 87  Temp(Src) 99.3 F (37.4 C) (Oral)  Resp 18  SpO2 99%  LMP 12/04/2010 Physical Exam  Constitutional: She is oriented to person, place, and time. She appears well-developed and well-nourished. No distress.  HENT:  Head: Normocephalic and atraumatic.  Eyes: Conjunctivae are normal.  Neck: Normal range of motion.  Cardiovascular: Normal rate, regular rhythm, normal heart sounds and intact distal pulses.   No murmur heard. Pulmonary/Chest: Effort normal and breath sounds normal. No respiratory distress. She has no wheezes. She has no rales. She exhibits no tenderness.  Abdominal: Soft. Bowel sounds are normal. She exhibits no distension.  Musculoskeletal: Normal range of motion.  Neurological: She is alert and oriented to person, place, and time. No cranial nerve deficit.  HDS, AAOx4. PERRL, EOMI, TML, face sym. CN 2-12 grossly intact. 5/5 sym, no drift, SILT, normal gait and coordination.    Skin: Skin is warm and dry.  Psychiatric: She has a normal mood and affect.  Nursing note and vitals reviewed.   ED Course  Procedures (including critical care time) Labs Review Labs Reviewed - No data to display  Imaging Review No results  found.   EKG Interpretation None      MDM   Final diagnoses:  None    AMESHIA PEWITT is a 49 y.o. fem with H&P as above. HCT NAICA. This pt is in nad, afvss, non-toxic appearing.  HA onset was slow, not quick or thunderclap, doubt ich.  Pt has no focal neuro sx, neuro exam is wnl, no visual disturbance, no dizziness/lightheadedness and HA is described as typical HA, doubt intracranial abnormality (aneurysm or mass) and vertebral artery or carotid artery dissection.  No infectious sx, no meningismus, afebrile, no ams, doubt meningitis.  No sinus ttp, doubt sinusitis.  There is nothing on hx or exam to give me c/f dental or ear etiology.  No tenderness over temporal artery, doubt temporal arteritis.  No tearing or eye pain, doubt cluster HA.  Nothing in hx to concern me for CO poisoning.  No hyperesthesia or rash to concern me for zoster.  Pt describes HA as their typical HA and most likely migraine HA.  The pt received the following treatments:  Medications  diphenhydrAMINE (BENADRYL) injection 25 mg (not administered)  sodium chloride 0.9 %  bolus 1,000 mL (1,000 mLs Intravenous New Bag/Given 02/17/14 1608)  diphenhydrAMINE (BENADRYL) injection 12.5 mg (12.5 mg Intravenous Given 02/17/14 1608)  promethazine (PHENERGAN) injection 25 mg (25 mg Intravenous Given 02/17/14 1609)  promethazine (PHENERGAN) injection 25 mg (25 mg Intravenous Given 02/17/14 1728)  diphenhydrAMINE (BENADRYL) injection 12.5 mg (12.5 mg Intravenous Given 02/17/14 1729)  ketorolac (TORADOL) 30 MG/ML injection 30 mg (30 mg Intravenous Given 02/17/14 1729)   Following treatment pt's symptoms improved  Clinical Impression: 1. Migraine without aura and without status migrainosus, not intractable     Disposition: Discharge  Condition: Good  I have discussed the results, Dx and Tx plan with the pt(& family if present). He/she/they expressed understanding and agree(s) with the plan. Discharge instructions discussed at  great length. Strict return precautions discussed and pt &/or family have verbalized understanding of the instructions. No further questions at time of discharge.    New Prescriptions   No medications on file    Follow Up: Treasa Schoolhillip Land, PA-C [address] [place] [id number] [phone]   As needed  [facility] [id number] [place] [id number] [phone]  If symptoms worsen   Pt seen in conjunction with Dr. Glynn OctaveStephen Rancour, MD  Ames DuraStephen Elain Wixon, DO [facility] Resident - PGY-2      Ames DuraStephen Bannon Giammarco, MD 02/17/14 1743  Glynn OctaveStephen Rancour, MD 02/18/14 [phone]

## 2014-02-17 NOTE — ED Notes (Signed)
PT returned from CT

## 2014-02-18 ENCOUNTER — Emergency Department (HOSPITAL_COMMUNITY)
Admission: EM | Admit: 2014-02-18 | Discharge: 2014-02-18 | Disposition: A | Discharge status: Home / Self Care | Payer: Medicare Other | Attending: Emergency Medicine | Admitting: Emergency Medicine

## 2014-02-18 ENCOUNTER — Encounter (HOSPITAL_COMMUNITY): Payer: Self-pay | Admitting: Emergency Medicine

## 2014-02-18 DIAGNOSIS — F329 Major depressive disorder, single episode, unspecified: Secondary | ICD-10-CM | POA: Diagnosis not present

## 2014-02-18 DIAGNOSIS — F419 Anxiety disorder, unspecified: Secondary | ICD-10-CM | POA: Diagnosis not present

## 2014-02-18 DIAGNOSIS — G43909 Migraine, unspecified, not intractable, without status migrainosus: Secondary | ICD-10-CM | POA: Diagnosis not present

## 2014-02-18 DIAGNOSIS — Z79899 Other long term (current) drug therapy: Secondary | ICD-10-CM | POA: Diagnosis not present

## 2014-02-18 DIAGNOSIS — R45851 Suicidal ideations: Secondary | ICD-10-CM | POA: Diagnosis present

## 2014-02-18 DIAGNOSIS — Z720 Tobacco use: Secondary | ICD-10-CM | POA: Diagnosis not present

## 2014-02-18 DIAGNOSIS — R519 Headache, unspecified: Secondary | ICD-10-CM

## 2014-02-18 DIAGNOSIS — R51 Headache: Secondary | ICD-10-CM

## 2014-02-18 DIAGNOSIS — Z880 Allergy status to penicillin: Secondary | ICD-10-CM | POA: Insufficient documentation

## 2014-02-18 DIAGNOSIS — F32A Depression, unspecified: Secondary | ICD-10-CM

## 2014-02-18 DIAGNOSIS — F4310 Post-traumatic stress disorder, unspecified: Secondary | ICD-10-CM

## 2014-02-18 DIAGNOSIS — G8929 Other chronic pain: Secondary | ICD-10-CM | POA: Insufficient documentation

## 2014-02-18 DIAGNOSIS — E039 Hypothyroidism, unspecified: Secondary | ICD-10-CM | POA: Diagnosis not present

## 2014-02-18 DIAGNOSIS — F332 Major depressive disorder, recurrent severe without psychotic features: Secondary | ICD-10-CM

## 2014-02-18 DIAGNOSIS — F909 Attention-deficit hyperactivity disorder, unspecified type: Secondary | ICD-10-CM | POA: Insufficient documentation

## 2014-02-18 LAB — CBC WITH DIFFERENTIAL/PLATELET
BASOS PCT: 1 % (ref 0–1)
Basophils Absolute: 0.1 10*3/uL (ref 0.0–0.1)
EOS PCT: 2 % (ref 0–5)
Eosinophils Absolute: 0.1 10*3/uL (ref 0.0–0.7)
HEMATOCRIT: 40.4 % (ref 36.0–46.0)
Hemoglobin: 14.1 g/dL (ref 12.0–15.0)
LYMPHS ABS: 2.4 10*3/uL (ref 0.7–4.0)
Lymphocytes Relative: 31 % (ref 12–46)
MCH: 32.6 pg (ref 26.0–34.0)
MCHC: 34.9 g/dL (ref 30.0–36.0)
MCV: 93.5 fL (ref 78.0–100.0)
MONOS PCT: 6 % (ref 3–12)
Monocytes Absolute: 0.4 10*3/uL (ref 0.1–1.0)
Neutro Abs: 4.6 10*3/uL (ref 1.7–7.7)
Neutrophils Relative %: 60 % (ref 43–77)
Platelets: 214 10*3/uL (ref 150–400)
RBC: 4.32 MIL/uL (ref 3.87–5.11)
RDW: 13.1 % (ref 11.5–15.5)
WBC: 7.5 10*3/uL (ref 4.0–10.5)

## 2014-02-18 LAB — COMPREHENSIVE METABOLIC PANEL
ALBUMIN: 3.2 g/dL — AB (ref 3.5–5.2)
ALT: 16 U/L (ref 0–35)
AST: 18 U/L (ref 0–37)
Alkaline Phosphatase: 88 U/L (ref 39–117)
Anion gap: 10 (ref 5–15)
BUN: 7 mg/dL (ref 6–23)
CHLORIDE: 112 mmol/L (ref 96–112)
CO2: 19 mmol/L (ref 19–32)
Calcium: 8.8 mg/dL (ref 8.4–10.5)
Creatinine, Ser: 0.85 mg/dL (ref 0.50–1.10)
GFR calc Af Amer: >90 mL/min (ref >90)
GFR, EST NON AFRICAN AMERICAN: 79 mL/min — AB (ref >90)
Glucose, Bld: 101 mg/dL — ABNORMAL HIGH (ref 70–99)
Potassium: 3.7 mmol/L (ref 3.5–5.1)
Sodium: 141 mmol/L (ref 135–145)
TOTAL PROTEIN: 6.3 g/dL (ref 6.0–8.3)
Total Bilirubin: 0.4 mg/dL (ref 0.3–1.2)

## 2014-02-18 LAB — RAPID URINE DRUG SCREEN, HOSP PERFORMED
Amphetamines: NOT DETECTED
BARBITURATES: NOT DETECTED
Benzodiazepines: POSITIVE — AB
COCAINE: NOT DETECTED
OPIATES: NOT DETECTED
TETRAHYDROCANNABINOL: NOT DETECTED

## 2014-02-18 LAB — ETHANOL

## 2014-02-18 MED ORDER — AMPHETAMINE-DEXTROAMPHETAMINE 10 MG PO TABS
10.0000 mg | ORAL_TABLET | Freq: Two times a day (BID) | ORAL | Status: DC
  Administered 2014-02-18: 10 mg via ORAL
  Filled 2014-02-18: qty 1

## 2014-02-18 MED ORDER — TRAZODONE HCL 100 MG PO TABS: 300.0000 mg | ORAL_TABLET | Freq: Every evening | ORAL | Status: DC | PRN

## 2014-02-18 MED ORDER — OXYCODONE HCL 5 MG PO TABS
5.0000 mg | ORAL_TABLET | Freq: Four times a day (QID) | ORAL | Status: DC | PRN
  Administered 2014-02-18: 5 mg via ORAL
  Filled 2014-02-18: qty 1

## 2014-02-18 MED ORDER — MORPHINE SULFATE ER 30 MG PO TBCR
30.0000 mg | EXTENDED_RELEASE_TABLET | Freq: Two times a day (BID) | ORAL | Status: DC
  Administered 2014-02-18: 30 mg via ORAL
  Filled 2014-02-18: qty 1

## 2014-02-18 MED ORDER — BUSPIRONE HCL 10 MG PO TABS
10.0000 mg | ORAL_TABLET | Freq: Two times a day (BID) | ORAL | Status: DC
  Administered 2014-02-18: 10 mg via ORAL
  Filled 2014-02-18: qty 1

## 2014-02-18 MED ORDER — PROMETHAZINE HCL 25 MG/ML IJ SOLN: 25.0000 mg | Freq: Once | INTRAMUSCULAR | Status: DC

## 2014-02-18 MED ORDER — LEVOTHYROXINE SODIUM 125 MCG PO TABS
125.0000 ug | ORAL_TABLET | Freq: Every day | ORAL | Status: DC
  Filled 2014-02-18 (×3): qty 1

## 2014-02-18 MED ORDER — VENLAFAXINE HCL ER 150 MG PO CP24: 150.0000 mg | ORAL_CAPSULE | Freq: Every day | ORAL | Status: DC

## 2014-02-18 MED ORDER — PROMETHAZINE HCL 25 MG/ML IJ SOLN
25.0000 mg | Freq: Once | INTRAMUSCULAR | Status: AC
  Administered 2014-02-18: 25 mg via INTRAMUSCULAR
  Filled 2014-02-18 (×2): qty 1

## 2014-02-18 MED ORDER — LITHIUM CARBONATE 300 MG PO CAPS
300.0000 mg | ORAL_CAPSULE | Freq: Two times a day (BID) | ORAL | Status: DC
  Administered 2014-02-18: 300 mg via ORAL
  Filled 2014-02-18: qty 1

## 2014-02-18 MED ORDER — HYDROMORPHONE HCL 1 MG/ML IJ SOLN
1.0000 mg | Freq: Once | INTRAMUSCULAR | Status: AC
  Administered 2014-02-18: 1 mg via INTRAMUSCULAR
  Filled 2014-02-18: qty 1

## 2014-02-18 MED ORDER — KETOROLAC TROMETHAMINE 60 MG/2ML IM SOLN
60.0000 mg | Freq: Once | INTRAMUSCULAR | Status: AC
  Administered 2014-02-18: 60 mg via INTRAMUSCULAR
  Filled 2014-02-18: qty 2

## 2014-02-18 MED ORDER — NICOTINE 21 MG/24HR TD PT24: 21.0000 mg | MEDICATED_PATCH | Freq: Every day | TRANSDERMAL | Status: DC | PRN

## 2014-02-18 MED ORDER — DIAZEPAM 5 MG PO TABS
10.0000 mg | ORAL_TABLET | Freq: Three times a day (TID) | ORAL | Status: DC | PRN
  Administered 2014-02-18: 10 mg via ORAL
  Filled 2014-02-18 (×2): qty 2

## 2014-02-18 NOTE — ED Notes (Signed)
Pt verbalized understanding of discharge instructions. Pt has no further questions. Pt being discharged home with her mother driving.

## 2014-02-18 NOTE — ED Notes (Signed)
Dr Patria Manecampos aware pt with temp 100
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
## 2014-02-18 NOTE — ED Notes (Signed)
Pt has participated in her telepsych with conrad np

## 2014-02-18 NOTE — ED Provider Notes (Signed)
4:19 PM Patient denies suicidal or homicidal thoughts.  Her headache is nonspecific and likely migraine in nature.  Head CT yesterday was normal.  Patient was seen and evaluated by the psychiatry team believe she is safe for discharge home.  I do not believe the patient is a threat to herself or to others at this time.  Please see consultation note for complete details.  Janet CoKevin [name], MD 02/18/14 [phone]

## 2014-02-18 NOTE — ED Notes (Signed)
Trained Sitter is at bedside at this time.
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
## 2014-02-18 NOTE — [facility] Counselor (Signed)
Writer informed TTS (Najah) of the consult.  

## 2014-02-18 NOTE — ED Notes (Signed)
PATIENT REPORTS DR Olegario ShearerLANDON MANAGES HER MIGRAINE HEADACHES. STATES SHE HAS THERAPY ON TUESDAYS AND WILL BE STARTING A NEW SUPPORT GROUP ON Friday. STATES "I JUST NEED TO GET HOME SO I CAN DO THE THINGS THAT HELP ME FEEL BETTER LIKE WRITING IN MY JOURNAL AND TALKING TO MY MOTHER
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
## 2014-02-18 NOTE — ED Notes (Signed)
PT MOTHER HAS CALLED AND REQUESTED TO SPEAK WITH DR CAMPOS. DR CAMPOS GIVEN PHONE NUMBER AND MESSAGE

## 2014-02-18 NOTE — ED Notes (Signed)
Machine placed in patient room for TTS.
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
## 2014-02-18 NOTE — ED Notes (Signed)
Called charge to notify of Suicidal/Homicidal.   Staffing called to get sitter for patient.   Mother of patient is at bedside at this time.   Patient near the nurses station.

## 2014-02-18 NOTE — ED Notes (Signed)
MD Patria Maneampos made aware that pts headache has not improved and that she is nauseated at this time. MD reports that he will place and order for IM phenergan at this time.

## 2014-02-18 NOTE — ED Notes (Signed)
Pt wanded by security. 

## 2014-02-18 NOTE — ED Notes (Signed)
Sitter placed with patient.   Suicidal precautions and homicidal precautions intiated and maintained.

## 2014-02-19 ENCOUNTER — Emergency Department (HOSPITAL_COMMUNITY)
Admission: EM | Admit: 2014-02-19 | Discharge: 2014-02-19 | Disposition: A | Discharge status: Home / Self Care | Payer: Medicare Other | Attending: Emergency Medicine | Admitting: Emergency Medicine

## 2014-02-19 ENCOUNTER — Encounter (HOSPITAL_COMMUNITY): Payer: Self-pay | Admitting: Emergency Medicine

## 2014-02-19 DIAGNOSIS — Z880 Allergy status to penicillin: Secondary | ICD-10-CM | POA: Diagnosis not present

## 2014-02-19 DIAGNOSIS — Z3202 Encounter for pregnancy test, result negative: Secondary | ICD-10-CM | POA: Diagnosis not present

## 2014-02-19 DIAGNOSIS — R45851 Suicidal ideations: Secondary | ICD-10-CM | POA: Diagnosis not present

## 2014-02-19 DIAGNOSIS — E039 Hypothyroidism, unspecified: Secondary | ICD-10-CM | POA: Insufficient documentation

## 2014-02-19 DIAGNOSIS — G8929 Other chronic pain: Secondary | ICD-10-CM | POA: Diagnosis not present

## 2014-02-19 DIAGNOSIS — G43809 Other migraine, not intractable, without status migrainosus: Secondary | ICD-10-CM

## 2014-02-19 DIAGNOSIS — Z720 Tobacco use: Secondary | ICD-10-CM | POA: Insufficient documentation

## 2014-02-19 DIAGNOSIS — R51 Headache: Secondary | ICD-10-CM | POA: Diagnosis present

## 2014-02-19 DIAGNOSIS — Z79899 Other long term (current) drug therapy: Secondary | ICD-10-CM | POA: Insufficient documentation

## 2014-02-19 LAB — RAPID URINE DRUG SCREEN, HOSP PERFORMED
Amphetamines: POSITIVE — AB
BARBITURATES: NOT DETECTED
Benzodiazepines: POSITIVE — AB
Cocaine: NOT DETECTED
Opiates: POSITIVE — AB
Tetrahydrocannabinol: NOT DETECTED

## 2014-02-19 LAB — COMPREHENSIVE METABOLIC PANEL
ALBUMIN: 3.6 g/dL (ref 3.5–5.2)
ALK PHOS: 94 U/L (ref 39–117)
ALT: 19 U/L (ref 0–35)
AST: 37 U/L (ref 0–37)
Anion gap: 9 (ref 5–15)
BUN: <5 mg/dL — ABNORMAL LOW (ref 6–23)
CO2: 21 mmol/L (ref 19–32)
CREATININE: 0.9 mg/dL (ref 0.50–1.10)
Calcium: 8.8 mg/dL (ref 8.4–10.5)
Chloride: 107 mmol/L (ref 96–112)
GFR calc Af Amer: 85 mL/min — ABNORMAL LOW (ref >90)
GFR calc non Af Amer: 73 mL/min — ABNORMAL LOW (ref >90)
Glucose, Bld: 96 mg/dL (ref 70–99)
Potassium: 3.5 mmol/L (ref 3.5–5.1)
SODIUM: 137 mmol/L (ref 135–145)
Total Bilirubin: 0.3 mg/dL (ref 0.3–1.2)
Total Protein: 6.8 g/dL (ref 6.0–8.3)

## 2014-02-19 LAB — CBC
HEMATOCRIT: 42 % (ref 36.0–46.0)
HEMOGLOBIN: 14.5 g/dL (ref 12.0–15.0)
MCH: 32.5 pg (ref 26.0–34.0)
MCHC: 34.5 g/dL (ref 30.0–36.0)
MCV: 94.2 fL (ref 78.0–100.0)
Platelets: 231 10*3/uL (ref 150–400)
RBC: 4.46 MIL/uL (ref 3.87–5.11)
RDW: 13.2 % (ref 11.5–15.5)
WBC: 13.6 10*3/uL — ABNORMAL HIGH (ref 4.0–10.5)

## 2014-02-19 LAB — ETHANOL: Alcohol, Ethyl (B): <5 mg/dL (ref 0–9)

## 2014-02-19 LAB — ACETAMINOPHEN LEVEL

## 2014-02-19 LAB — SALICYLATE LEVEL: Salicylate Lvl: <4 mg/dL (ref 2.8–20.0)

## 2014-02-19 LAB — POC URINE PREG, ED: Preg Test, Ur: NEGATIVE

## 2014-02-19 MED ORDER — ACETAMINOPHEN 500 MG PO TABS
1000.0000 mg | ORAL_TABLET | Freq: Once | ORAL | Status: AC
  Administered 2014-02-19: 1000 mg via ORAL
  Filled 2014-02-19: qty 2

## 2014-02-19 MED ORDER — KETOROLAC TROMETHAMINE 60 MG/2ML IM SOLN
30.0000 mg | Freq: Once | INTRAMUSCULAR | Status: AC
  Administered 2014-02-19: 30 mg via INTRAMUSCULAR
  Filled 2014-02-19: qty 2

## 2014-02-19 MED ORDER — METHOCARBAMOL 500 MG PO TABS
1000.0000 mg | ORAL_TABLET | Freq: Once | ORAL | Status: AC
  Administered 2014-02-19: 1000 mg via ORAL
  Filled 2014-02-19: qty 2

## 2014-02-19 MED ORDER — METHOCARBAMOL 500 MG PO TABS: 1000.0000 mg | ORAL_TABLET | Freq: Four times a day (QID) | ORAL | Status: AC | PRN

## 2014-02-19 MED ORDER — BUTALBITAL-APAP-CAFFEINE 50-325-40 MG PO TABS: 1.0000 | ORAL_TABLET | Freq: Four times a day (QID) | ORAL | Status: AC | PRN

## 2014-02-19 NOTE — ED Notes (Signed)
Patient unable to give urine sample at this time

## 2014-02-19 NOTE — Discharge Instructions (Signed)
Please follow with your primary care doctor in the next 2 days for a check-up. They must obtain records for further management.   Do not hesitate to return to the Emergency Department for any new, worsening or concerning symptoms.    Migraine Headache A migraine headache is very bad, throbbing pain on one or both sides of your head. Talk to your doctor about what things may bring on (trigger) your migraine headaches. HOME CARE  Only take medicines as told by your doctor.  Lie down in a dark, quiet room when you have a migraine.  Keep a journal to find out if certain things bring on migraine headaches. For example, write down:  What you eat and drink.  How much sleep you get.  Any change to your diet or medicines.  Lessen how much alcohol you drink.  Quit smoking if you smoke.  Get enough sleep.  Lessen any stress in your life.  Keep lights dim if bright lights bother you or make your migraines worse. GET HELP RIGHT AWAY IF:   Your migraine becomes really bad.  You have a fever.  You have a stiff neck.  You have trouble seeing.  Your muscles are weak, or you lose muscle control.  You lose your balance or have trouble walking.  You feel like you will pass out (faint), or you pass out.  You have really bad symptoms that are different than your first symptoms. MAKE SURE YOU:   Understand these instructions.  Will watch your condition.  Will get help right away if you are not doing well or get worse. Document Released: 10/14/2007 Document Revised: 03/29/2011 Document Reviewed: 09/11/2012 [facility] Patient Information 2015 [employer] [place]. This information is not intended to replace advice given to you by your health care provider. Make sure you discuss any questions you have with your health care provider. .Marland Kitchen

## 2014-02-19 NOTE — ED Notes (Signed)
STATES MIGRAINE DR IS DR LAND - STATES HE PRESCRIBES STADOL. LAST TOOK LAST PM WHICH DID NOT HELP. STATES HAS NOT BEEN ABLE TO GET IN CONTACT W/HIM. STATES WANTS TO GO TO [facility] BECAUSE "THEY GIVE ME MEDS AND DON'T TAKE THEM AWAY LIKE THE OTHER PLACES". PT NOTED TO BE WEARING SUNGLASSES - STATES D/T MIGRAINE.

## 2014-02-19 NOTE — [facility] Counselor (Addendum)
Per Donell SievertSpencer Simon, NP, pt does not meet criteria for inpatient treatment. Pt to follow up with her psychiatrist and other OPT resources tomorrow. TTS Counselor faxed a [employer] managerno-harm contract and [employer] [facility].  TTS Counselor shared disposition with Wyn[facility] EmeryNicole [facility]isciotta [facility]C [facility].   Cyndie MullAnna Gaylene Moylan, [facility] Triage Specialist
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
## 2014-02-19 NOTE — ED Notes (Signed)
Pt c/o migraine today with N/V; pt sts SI without a plan; pt seen here yesterday for same; pt tearful

## 2014-02-19 NOTE — BHH Counselor (Deleted)
TTS Counselor [facility] and asked for tele-assessment cart to be placed in pt's room. Counselor also reviewed pt's chart and MD note in preparation for behavioral health assessment.  Assessment to begin shortly.   Cyndie MullAnna Cadence Haslam, [facility] Triage Specialist

## 2014-02-19 NOTE — ED Provider Notes (Addendum)
CSN: [id number]     Arrival date & time 02/19/14  1622 History   First MD Initiated Contact with Patient 02/19/14 1703     Chief Complaint  Patient presents with  . Headache  . Suicidal     (Consider location/radiation/quality/duration/timing/severity/associated sxs/prior Treatment) HPI   [name] is a 49 y.o. female complaining of 10 out of 10 diffuse headache onset 4 days ago and mild suicidal ideation without plan. Patient takes oxycodone for her chronic pain, headaches started after she ran out of her medication several days ago. 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 She denies fever, chills, chest pain, shortness of breath, nausea, vomiting, thunderclap onset, headache exacerbation with Valsalva, worsening in the morning. Cervicalgia, change in vision, dysarthria, ataxia, homicidal ideation, drug or alcohol abuse, auditory or visual hallucinations.  Past Medical History  Diagnosis Date  . Migraine   . PTSD (post-traumatic stress disorder)   . Anxiety   . Personality disorder   . Depression   . High cholesterol   . Hypothyroid   . ADHD (attention deficit hyperactivity disorder)    Past Surgical History  Procedure Laterality Date  . Cholecystectomy    . Mandible fracture surgery     History reviewed. No pertinent family history. History  Substance Use Topics  . Smoking status: Current Every Day Smoker -- 1.00 packs/day    Types: Cigarettes  . Smokeless tobacco: Not on file  . Alcohol Use: No   OB History    No data available     Review of Systems  10 systems reviewed and found to be negative, except as noted in the HPI.   Allergies  Compazine; Geodon; Imitrex; Metoclopramide hcl; Other; Penicillins; Wellbutrin; and Zofran  Home Medications   Prior to Admission medications   Medication Sig Start Date End Date Taking? Authorizing Provider  amphetamine-dextroamphetamine (ADDERALL) 10 MG tablet Take 1 tablet (10 mg total) by  mouth 2 (two) times daily. For ADHD 10/23/13  Yes Sanjuana KavaAgnes I Nwoko, NP  butorphanol (STADOL) 10 MG/ML nasal spray Place 1 spray into the nose every 4 (four) hours as needed for headache.   Yes Historical Provider, MD  diazepam (VALIUM) 10 MG tablet Take 1 tablet (10 mg total) by mouth 3 (three) times daily. For anxiety Patient taking differently: Take 10 mg by mouth 4 (four) times daily. For anxiety 10/23/13  Yes Sanjuana KavaAgnes I Nwoko, NP  haloperidol (HALDOL) 2 MG tablet Take 2 mg by mouth 2 (two) times daily.   Yes Historical Provider, MD  levothyroxine (SYNTHROID, LEVOTHROID) 125 MCG tablet Take 1 tablet (125 mcg total) by mouth daily before breakfast. For low thyroid function 10/23/13  Yes Sanjuana KavaAgnes I Nwoko, NP  lithium carbonate 300 MG capsule Take 1 capsule (300 mg total) by mouth 2 (two) times daily with a meal. For mood stabilization 10/23/13  Yes Sanjuana KavaAgnes I Nwoko, NP  oxyCODONE (OXY IR/ROXICODONE) 5 MG immediate release tablet Take 1 tablet (5 mg total) by mouth every 6 (six) hours as needed for severe pain or breakthrough pain. 10/23/13  Yes Sanjuana KavaAgnes I Nwoko, NP  oxymorphone (OPANA ER) 15 MG 12 hr tablet Take 15 mg by mouth every 12 (twelve) hours.   Yes Historical Provider, MD  prazosin (MINIPRESS) 2 MG capsule Take 2 mg by mouth at bedtime.   Yes Historical Provider, MD  traZODone (DESYREL) 100 MG tablet Take 1 tablet (100 mg total) by mouth at bedtime as needed for sleep. For sleep  Patient taking differently: Take 300 mg by mouth at bedtime as needed for sleep. For sleep 10/23/13  Yes Sanjuana Kava, NP  venlafaxine XR (EFFEXOR-XR) 150 MG 24 hr capsule Take 1 capsule (150 mg total) by mouth daily with breakfast. For depression Patient taking differently: Take 150 mg by mouth 2 (two) times daily. For depression 10/23/13  Yes Sanjuana Kava, NP  busPIRone (BUSPAR) 10 MG tablet Take 1 tablet (10 mg total) by mouth 2 (two) times daily. For anxiety Patient not taking: Reported on 12/25/2013 10/23/13   Sanjuana Kava, NP   morphine (MS CONTIN) 15 MG 12 hr tablet Take 1 tablet (15 mg total) by mouth every 12 (twelve) hours. For severe pain Patient not taking: Reported on 12/25/2013 10/23/13   Sanjuana Kava, NP   BP 108/65 mmHg  Pulse 74  Temp(Src) 99.1 F (37.3 C) (Oral)  Resp 20  Ht  (1.6 m)  Wt 135 lb (61.236 kg)  BMI 23.92 kg/m2  SpO2 99%  LMP 12/04/2010 Physical Exam  Constitutional: She is oriented to person, place, and time. She appears well-developed and well-nourished. No distress.  HENT:  Head: Normocephalic and atraumatic.  Mouth/Throat: Oropharynx is clear and moist.  Eyes: Conjunctivae and EOM are normal. Pupils are equal, round, and reactive to light.  Neck: Normal range of motion. Neck supple.  Cardiovascular: Normal rate, regular rhythm and intact distal pulses.   Pulmonary/Chest: Effort normal and breath sounds normal. No stridor. No respiratory distress. She has no wheezes. She has no rales. She exhibits no tenderness.  Abdominal: Soft. Bowel sounds are normal. She exhibits no distension and no mass. There is no tenderness. There is no rebound and no guarding.  Musculoskeletal: Normal range of motion.  Neurological: She is alert and oriented to person, place, and time. No cranial nerve deficit. Coordination normal.  Psychiatric: She has a normal mood and affect.  Nursing note and vitals reviewed.   ED Course  Procedures (including critical care time) Labs Review Labs Reviewed  ACETAMINOPHEN LEVEL - Abnormal; Notable for the following:    Acetaminophen (Tylenol), Serum <10.0 (*)    All other components within normal limits  CBC - Abnormal; Notable for the following:    WBC 13.6 (*)    All other components within normal limits  COMPREHENSIVE METABOLIC PANEL - Abnormal; Notable for the following:    BUN <5 (*)    GFR calc non Af Amer 73 (*)    GFR calc Af Amer 85 (*)    All other components within normal limits  URINE RAPID DRUG SCREEN (HOSP PERFORMED) - Abnormal; Notable  for the following:    Opiates POSITIVE (*)    Benzodiazepines POSITIVE (*)    Amphetamines POSITIVE (*)    All other components within normal limits  ETHANOL  SALICYLATE LEVEL  POC URINE PREG, ED    Imaging Review No results found.   EKG Interpretation None      MDM   Final diagnoses:  Other migraine without status migrainosus, not intractable    Filed Vitals:   02/19/14 1637 02/19/14 1816 02/19/14 2107  BP: 110/70 108/65 113/86  Pulse: 104 74 89  Temp: 98.2 F (36.8 C) 99.1 F (37.3 C) 98.3 F (36.8 C)  TempSrc: Oral Oral Oral  Resp: Height:  (1.6 m)    Weight: 135 lb (61.236 kg)    SpO2: 99% 99% 100%    Medications  acetaminophen (TYLENOL) tablet 1,000 mg (  1,000 mg Oral Given 02/19/14 2036)  ketorolac (TORADOL) injection 30 mg (30 mg Intramuscular Given 02/19/14 2036)  methocarbamol (ROBAXIN) tablet 1,000 mg (1,000 mg Oral Given 02/19/14 2036)    [name] is a pleasant 49 y.o. female presenting with vague suicidal ideations and headache exacerbation. Neuro exam is nonfocal, there are no red flags in her history of headache. She had a negative CT scan 2 days ago. There is no meningeal signs. Headache is improved with Tylenol, Robaxin and Toradol. Patient is specifically requesting Dilaudid for her headache and have explained to her that this is not indicated. Is been evaluated by TTS, she is medically cleared for psychiatric evaluation. TTS is determined that if she can sign a contract for safety she can be discharged home. Her mother is with her and will take her home.  Evaluation does not show pathology that would require ongoing emergent intervention or inpatient treatment. Pt is hemodynamically stable and mentating appropriately. Discussed findings and plan with patient/guardian, who agrees with care plan. All questions answered. Return precautions discussed and outpatient follow up given.   Discharge Medication List as of 02/19/2014  8:57 PM     START taking these medications   Details  butalbital-acetaminophen-caffeine (FIORICET) 50-325-40 MG per tablet Take 1 tablet by mouth every 6 (six) hours as needed for headache., Starting 02/19/2014, Until Discontinued, Print    methocarbamol (ROBAXIN) 500 MG tablet Take 2 tablets (1,000 mg total) by mouth 4 (four) times daily as needed (Pain)., Starting 02/19/2014, Until Discontinued, Print             Wynetta Emery, PA-C 02/19/14 2218  Toy Cookey, MD 02/20/14 1096  Wynetta Emery, PA-C 03/05/14 1040  Toy Cookey, MD 03/05/14 [phone]

## 2014-02-19 NOTE — BHH Counselor (Signed)
TTS Counselor spoke with attending RN and asked if tele-assessment cart could be placed in pt's room. Counselor also reviewed pt's chart and note by MD in preparation for behavioral health assessment.  Assessment to begin shortly.  Cyndie MullAnna Deshawn Skelley, [facility] Triage Specialist

## 2014-03-02 ENCOUNTER — Emergency Department (HOSPITAL_COMMUNITY)
Admission: EM | Admit: 2014-03-02 | Discharge: 2014-03-02 | Disposition: A | Discharge status: Home / Self Care | Payer: Medicare Other | Attending: Emergency Medicine | Admitting: Emergency Medicine

## 2014-03-02 ENCOUNTER — Encounter (HOSPITAL_COMMUNITY): Payer: Self-pay | Admitting: *Deleted

## 2014-03-02 DIAGNOSIS — F419 Anxiety disorder, unspecified: Secondary | ICD-10-CM | POA: Diagnosis not present

## 2014-03-02 DIAGNOSIS — Z720 Tobacco use: Secondary | ICD-10-CM | POA: Diagnosis not present

## 2014-03-02 DIAGNOSIS — G43909 Migraine, unspecified, not intractable, without status migrainosus: Secondary | ICD-10-CM | POA: Insufficient documentation

## 2014-03-02 DIAGNOSIS — F329 Major depressive disorder, single episode, unspecified: Secondary | ICD-10-CM | POA: Diagnosis not present

## 2014-03-02 DIAGNOSIS — Z880 Allergy status to penicillin: Secondary | ICD-10-CM | POA: Diagnosis not present

## 2014-03-02 DIAGNOSIS — E039 Hypothyroidism, unspecified: Secondary | ICD-10-CM | POA: Diagnosis not present

## 2014-03-02 DIAGNOSIS — Z79899 Other long term (current) drug therapy: Secondary | ICD-10-CM | POA: Insufficient documentation

## 2014-03-02 DIAGNOSIS — F4310 Post-traumatic stress disorder, unspecified: Secondary | ICD-10-CM | POA: Insufficient documentation

## 2014-03-02 DIAGNOSIS — F909 Attention-deficit hyperactivity disorder, unspecified type: Secondary | ICD-10-CM | POA: Insufficient documentation

## 2014-03-02 DIAGNOSIS — R112 Nausea with vomiting, unspecified: Secondary | ICD-10-CM | POA: Diagnosis present

## 2014-03-02 MED ORDER — SODIUM CHLORIDE 0.9 % IV BOLUS (SEPSIS)
1000.0000 mL | Freq: Once | INTRAVENOUS | Status: AC
  Administered 2014-03-02: 1000 mL via INTRAVENOUS

## 2014-03-02 MED ORDER — DIPHENHYDRAMINE HCL 50 MG/ML IJ SOLN
25.0000 mg | Freq: Once | INTRAMUSCULAR | Status: AC
  Administered 2014-03-02: 25 mg via INTRAVENOUS
  Filled 2014-03-02: qty 1

## 2014-03-02 MED ORDER — BUTALBITAL-APAP-CAFFEINE 50-325-40 MG PO TABS
1.0000 | ORAL_TABLET | Freq: Once | ORAL | Status: AC
  Administered 2014-03-02: 1 via ORAL
  Filled 2014-03-02: qty 1

## 2014-03-02 MED ORDER — BUPIVACAINE HCL 0.5 % IJ SOLN
5.0000 mL | Freq: Once | INTRAMUSCULAR | Status: AC
  Administered 2014-03-02: 5 mL
  Filled 2014-03-02: qty 5

## 2014-03-02 MED ORDER — PROMETHAZINE HCL 25 MG/ML IJ SOLN
25.0000 mg | Freq: Once | INTRAMUSCULAR | Status: AC
  Administered 2014-03-02: 25 mg via INTRAVENOUS
  Filled 2014-03-02: qty 1

## 2014-03-02 MED ORDER — KETOROLAC TROMETHAMINE 30 MG/ML IJ SOLN
30.0000 mg | Freq: Once | INTRAMUSCULAR | Status: AC
Start: 2014-03-02 — End: 2014-03-02
  Administered 2014-03-02: 30 mg via INTRAVENOUS
  Filled 2014-03-02: qty 1

## 2014-03-02 MED ORDER — LORAZEPAM 2 MG/ML IJ SOLN
1.0000 mg | Freq: Once | INTRAMUSCULAR | Status: AC
  Administered 2014-03-02: 1 mg via INTRAVENOUS
  Filled 2014-03-02: qty 1

## 2014-03-02 NOTE — ED Notes (Signed)
PA Laveda Normanran at bedside with patient.

## 2014-03-02 NOTE — ED Notes (Signed)
Migraine, n/v x 1 day. Sensitive to light.

## 2014-03-02 NOTE — Discharge Instructions (Signed)

## 2014-03-02 NOTE — ED Notes (Signed)
Attempted IV stick x3.

## 2014-03-02 NOTE — ED Notes (Signed)
Attempted IV stick, 2nd RN requested.

## 2014-03-02 NOTE — ED Provider Notes (Signed)
CSN: [id number]     Arrival date & time 03/02/14  2130 History   First MD Initiated Contact with Patient 03/02/14 [phone]     Chief Complaint  Patient presents with  . Migraine  . Nausea  . Emesis     (Consider location/radiation/quality/duration/timing/severity/associated sxs/prior Treatment) HPI   49 year old female with history of recurrent migraine headache who presents complaining of headache. Patient states for the past 2 days she has had persistent sharp pressure sensation to the back of her head which she described as a "vice grip" with associate Sound sensitivity with nausea and vomiting. Vomitus is nonbloody nonbilious. His headache is similar to her chronic migraine headache that she has since the age of 39. She took her home medication with minimal relief. She was seen in ED 4 days ago for the same complaint. She had a head CT scan a week ago that shows no acute abnormalities. She denies having any fever, chills, numbness, weakness, rash. She denies acute onset thunderclap headache, no IV drug use, and no active cancer  Past Medical History  Diagnosis Date  . Migraine   . PTSD (post-traumatic stress disorder)   . Anxiety   . Personality disorder   . Depression   . High cholesterol   . Hypothyroid   . ADHD (attention deficit hyperactivity disorder)    Past Surgical History  Procedure Laterality Date  . Cholecystectomy    . Mandible fracture surgery     History reviewed. No pertinent family history. History  Substance Use Topics  . Smoking status: Current Every Day Smoker -- 1.00 packs/day    Types: Cigarettes  . Smokeless tobacco: Not on file  . Alcohol Use: No   OB History    No data available     Review of Systems  All other systems reviewed and are negative.     Allergies  Compazine; Geodon; Imitrex; Metoclopramide hcl; Other; Penicillins; Wellbutrin; and Zofran  Home Medications   Prior to Admission medications   Medication Sig Start Date End Date  Taking? Authorizing Provider  amphetamine-dextroamphetamine (ADDERALL) 10 MG tablet Take 1 tablet (10 mg total) by mouth 2 (two) times daily. For ADHD 10/23/13   Sanjuana Kava, NP  busPIRone (BUSPAR) 10 MG tablet Take 1 tablet (10 mg total) by mouth 2 (two) times daily. For anxiety Patient not taking: Reported on 12/25/2013 10/23/13   Sanjuana Kava, NP  butalbital-acetaminophen-caffeine (FIORICET) [phone] MG per tablet Take 1 tablet by mouth every 6 (six) hours as needed for headache. 02/19/14   Nicole Pisciotta, PA-C  butorphanol (STADOL) 10 MG/ML nasal spray Place 1 spray into the nose every 4 (four) hours as needed for headache.    Historical Provider, MD  diazepam (VALIUM) 10 MG tablet Take 1 tablet (10 mg total) by mouth 3 (three) times daily. For anxiety Patient taking differently: Take 10 mg by mouth 4 (four) times daily. For anxiety 10/23/13   Sanjuana Kava, NP  haloperidol (HALDOL) 2 MG tablet Take 2 mg by mouth 2 (two) times daily.    Historical Provider, MD  levothyroxine (SYNTHROID, LEVOTHROID) 125 MCG tablet Take 1 tablet (125 mcg total) by mouth daily before breakfast. For low thyroid function 10/23/13   Sanjuana Kava, NP  lithium carbonate 300 MG capsule Take 1 capsule (300 mg total) by mouth 2 (two) times daily with a meal. For mood stabilization 10/23/13   Sanjuana Kava, NP  methocarbamol (ROBAXIN) 500 MG tablet Take 2 tablets (1,000 mg total) by  mouth 4 (four) times daily as needed (Pain). 02/19/14   Nicole Pisciotta, PA-C  morphine (MS CONTIN) 15 MG 12 hr tablet Take 1 tablet (15 mg total) by mouth every 12 (twelve) hours. For severe pain Patient not taking: Reported on 12/25/2013 10/23/13   Sanjuana KavaAgnes I Nwoko, NP  oxyCODONE (OXY IR/ROXICODONE) 5 MG immediate release tablet Take 1 tablet (5 mg total) by mouth every 6 (six) hours as needed for severe pain or breakthrough pain. 10/23/13   Sanjuana KavaAgnes I Nwoko, NP  oxymorphone (OPANA ER) 15 MG 12 hr tablet Take 15 mg by mouth every 12 (twelve) hours.     Historical Provider, MD  prazosin (MINIPRESS) 2 MG capsule Take 2 mg by mouth at bedtime.    Historical Provider, MD  traZODone (DESYREL) 100 MG tablet Take 1 tablet (100 mg total) by mouth at bedtime as needed for sleep. For sleep Patient taking differently: Take 300 mg by mouth at bedtime as needed for sleep. For sleep 10/23/13   Sanjuana KavaAgnes I Nwoko, NP  venlafaxine XR (EFFEXOR-XR) 150 MG 24 hr capsule Take 1 capsule (150 mg total) by mouth daily with breakfast. For depression Patient taking differently: Take 150 mg by mouth 2 (two) times daily. For depression 10/23/13   Sanjuana KavaAgnes I Nwoko, NP   BP 115/69 mmHg  Pulse 79  Temp(Src) 99.2 F (37.3 C) (Oral)  Resp 18  LMP 12/04/2010 Physical Exam  Constitutional: She is oriented to person, place, and time. She appears well-developed and well-nourished. No distress.  HENT:  Head: Atraumatic.  Eyes: Conjunctivae and EOM are normal. Pupils are equal, round, and reactive to light.  Neck: Normal range of motion. Neck supple.  No nuchal rigidity  Cardiovascular: Normal rate and regular rhythm.   Pulmonary/Chest: Effort normal and breath sounds normal.  Neurological: She is alert and oriented to person, place, and time.  Neurologic exam:  Speech clear, pupils equal round reactive to light, extraocular movements intact  Normal peripheral visual fields Cranial nerves III through XII normal including no facial droop Follows commands, moves all extremities x4, normal strength to bilateral upper and lower extremities at all major muscle groups including grip Sensation normal to light touch  Coordination intact, no limb ataxia, finger-nose-finger normal Rapid alternating movements normal No pronator drift Gait normal   Skin: No rash noted.  Psychiatric: She has a normal mood and affect.  Nursing note and vitals reviewed.   ED Course  NERVE BLOCK Date/Time: 03/02/2014 10:20 AM Performed by: Fayrene HelperRAN, Tazia Illescas Authorized by: Fayrene HelperRAN, Caralina Nop Consent: Verbal  consent obtained. Risks and benefits: risks, benefits and alternatives were discussed Consent given by: patient Patient understanding: patient states understanding of the procedure being performed Patient consent: the patient's understanding of the procedure matches consent given Site marked: the operative site was marked Patient identity confirmed: verbally with patient and arm band Time out: Immediately prior to procedure a "time out" was called to verify the correct patient, procedure, equipment, support staff and site/side marked as required. Indications: pain relief Nerve block body site: neck: paraspinal cervical  Laterality: right Patient sedated: no Preparation: Patient was prepped and draped in the usual sterile fashion. Patient position: sitting Needle gauge: 25 G Location technique: anatomical landmarks (1 inch lateral to C7 spinous process) Local anesthetic: bupivacaine 0.5% without epinephrine Anesthetic total: 4 ml Outcome: pain improved Patient tolerance: Patient tolerated the procedure well with no immediate complications   (including critical care time)  Headache similar to previous, no fever, neck stiffness, neuro findings or new  symptoms to suggest more serious etiology.  I don't think SAH, ICH, meningitis, encephalitis, mass at this time.  No recent trauma.  I don't feel imaging necessary at this time.  Plan to control symptoms.  9:23 AM Patient has received Toradol, Benadryl, Phenergan, Fioricet, Ativan, IV fluid with some relief of her symptoms. She requests for narcotic pain medication and states that it is the only thing that helps her with headache. Moderate amount of time was spent discussing the risk of rebound headache and I do not think narcotic pain medication is appropriate. Patient is nontoxic with normal stable vital signs and no fever. I offer an paraspinous cervical nerve block to help relieve her headache and pt amenable to plan.  Pt understand the risk  include infection, temporary paralysis, vessel injury, or no improvement of sxs.    10:22 AM paraspinous cervical nerve block performed by me with relief of sxs.  Pt tolerates well.  She request to be discharged.  Pt agrees to f/u with neurologist for further care.  Care discussed with Dr. Manus Gunning.    Labs Review Labs Reviewed - No data to display  Imaging Review No results found.   EKG Interpretation None      MDM   Final diagnoses:  Migraine without status migrainosus, not intractable, unspecified migraine type    BP 122/70 mmHg  Pulse 68  Temp(Src) 99.2 F (37.3 C) (Oral)  Resp 18  SpO2 100%  LMP 12/04/2010     Fayrene Helper, PA-C 03/02/14 1026  Glynn Octave, MD 03/02/14 [phone]

## 2014-03-08 ENCOUNTER — Encounter (HOSPITAL_COMMUNITY): Payer: Self-pay | Admitting: *Deleted

## 2014-03-08 ENCOUNTER — Emergency Department (HOSPITAL_COMMUNITY)
Admission: EM | Admit: 2014-03-08 | Discharge: 2014-03-08 | Disposition: A | Discharge status: Home / Self Care | Payer: Medicare Other | Attending: Emergency Medicine | Admitting: Emergency Medicine

## 2014-03-08 DIAGNOSIS — E039 Hypothyroidism, unspecified: Secondary | ICD-10-CM | POA: Diagnosis not present

## 2014-03-08 DIAGNOSIS — F4310 Post-traumatic stress disorder, unspecified: Secondary | ICD-10-CM | POA: Diagnosis not present

## 2014-03-08 DIAGNOSIS — F329 Major depressive disorder, single episode, unspecified: Secondary | ICD-10-CM | POA: Diagnosis not present

## 2014-03-08 DIAGNOSIS — F909 Attention-deficit hyperactivity disorder, unspecified type: Secondary | ICD-10-CM | POA: Insufficient documentation

## 2014-03-08 DIAGNOSIS — F419 Anxiety disorder, unspecified: Secondary | ICD-10-CM | POA: Diagnosis not present

## 2014-03-08 DIAGNOSIS — Z720 Tobacco use: Secondary | ICD-10-CM | POA: Diagnosis not present

## 2014-03-08 DIAGNOSIS — G43809 Other migraine, not intractable, without status migrainosus: Secondary | ICD-10-CM | POA: Diagnosis not present

## 2014-03-08 DIAGNOSIS — Z79899 Other long term (current) drug therapy: Secondary | ICD-10-CM | POA: Insufficient documentation

## 2014-03-08 DIAGNOSIS — Z880 Allergy status to penicillin: Secondary | ICD-10-CM | POA: Diagnosis not present

## 2014-03-08 DIAGNOSIS — G43909 Migraine, unspecified, not intractable, without status migrainosus: Secondary | ICD-10-CM | POA: Diagnosis present

## 2014-03-08 MED ORDER — HYDROMORPHONE HCL 1 MG/ML IJ SOLN
2.0000 mg | Freq: Once | INTRAMUSCULAR | Status: AC
  Administered 2014-03-08: 2 mg via INTRAVENOUS
  Filled 2014-03-08: qty 2

## 2014-03-08 MED ORDER — PROMETHAZINE HCL 25 MG PO TABS: 25.0000 mg | ORAL_TABLET | Freq: Once | ORAL | Status: DC

## 2014-03-08 MED ORDER — PROMETHAZINE HCL 25 MG/ML IJ SOLN
25.0000 mg | Freq: Once | INTRAMUSCULAR | Status: AC
  Administered 2014-03-08: 25 mg via INTRAMUSCULAR
  Filled 2014-03-08: qty 1

## 2014-03-08 MED ORDER — PROMETHAZINE HCL 25 MG/ML IJ SOLN: 12.5000 mg | Freq: Once | INTRAMUSCULAR | Status: DC

## 2014-03-08 MED ORDER — SODIUM CHLORIDE 0.9 % IV BOLUS (SEPSIS)
500.0000 mL | Freq: Once | INTRAVENOUS | Status: AC
Start: 2014-03-08 — End: 2014-03-08
  Administered 2014-03-08: 500 mL via INTRAVENOUS

## 2014-03-08 MED ORDER — HYDROMORPHONE HCL 1 MG/ML IJ SOLN
1.0000 mg | Freq: Once | INTRAMUSCULAR | Status: DC
  Filled 2014-03-08: qty 1

## 2014-03-08 NOTE — ED Notes (Signed)
Dr. Webb at bedside

## 2014-03-08 NOTE — Discharge Instructions (Signed)

## 2014-03-08 NOTE — ED Provider Notes (Signed)
CSN: [id number]     Arrival date & time 03/08/14  1500 History   First MD Initiated Contact with Patient 03/08/14 1724     Chief Complaint  Patient presents with  . Migraine  . Anxiety   (Consider location/radiation/quality/duration/timing/severity/associated sxs/prior Treatment) Patient is a 49 y.o. female presenting with migraines and anxiety. The history is provided by the patient. No language interpreter was used.  Migraine This is a recurrent problem. The current episode started in the past 7 days. The problem occurs every several days. The problem has been gradually worsening. Associated symptoms include headaches, nausea and vomiting. Pertinent negatives include no abdominal pain, chest pain, chills, coughing, fever, myalgias, neck pain, numbness, rash, vertigo, visual change or weakness. The symptoms are aggravated by stress. She has tried oral narcotics, drinking, eating and sleep for the symptoms. The treatment provided no relief.  Anxiety Associated symptoms include headaches, nausea and vomiting. Pertinent negatives include no abdominal pain, chest pain, chills, coughing, fever, myalgias, neck pain, numbness, rash, vertigo, visual change or weakness.    Past Medical History  Diagnosis Date  . Migraine   . PTSD (post-traumatic stress disorder)   . Anxiety   . Personality disorder   . Depression   . High cholesterol   . Hypothyroid   . ADHD (attention deficit hyperactivity disorder)    Past Surgical History  Procedure Laterality Date  . Cholecystectomy    . Mandible fracture surgery     History reviewed. No pertinent family history. History  Substance Use Topics  . Smoking status: Current Every Day Smoker -- 1.00 packs/day    Types: Cigarettes  . Smokeless tobacco: Not on file  . Alcohol Use: No   OB History    No data available     Review of Systems  Constitutional: Negative for fever and chills.  Respiratory: Negative for cough and shortness of breath.    Cardiovascular: Negative for chest pain.  Gastrointestinal: Positive for nausea and vomiting. Negative for abdominal pain.  Genitourinary: Negative for dysuria.  Musculoskeletal: Negative for myalgias and neck pain.  Skin: Negative for rash.  Neurological: Positive for headaches. Negative for dizziness, vertigo, weakness, light-headedness and numbness.      Allergies  Compazine; Geodon; Imitrex; Metoclopramide hcl; Other; Penicillins; Wellbutrin; and Zofran  Home Medications   Prior to Admission medications   Medication Sig Start Date End Date Taking? Authorizing Provider  amphetamine-dextroamphetamine (ADDERALL) 10 MG tablet Take 1 tablet (10 mg total) by mouth 2 (two) times daily. For ADHD 10/23/13  Yes Sanjuana Kava, NP  busPIRone (BUSPAR) 10 MG tablet Take 1 tablet (10 mg total) by mouth 2 (two) times daily. For anxiety 10/23/13  Yes Sanjuana Kava, NP  haloperidol (HALDOL) 2 MG tablet Take 2 mg by mouth 2 (two) times daily.   Yes Historical Provider, MD  levothyroxine (SYNTHROID, LEVOTHROID) 125 MCG tablet Take 1 tablet (125 mcg total) by mouth daily before breakfast. For low thyroid function 10/23/13  Yes Sanjuana Kava, NP  lithium carbonate 300 MG capsule Take 1 capsule (300 mg total) by mouth 2 (two) times daily with a meal. For mood stabilization 10/23/13  Yes Sanjuana Kava, NP  promethazine (PHENERGAN) 12.5 MG suppository Place 12.5 mg rectally every 6 (six) hours as needed for nausea or vomiting.   Yes Historical Provider, MD  traZODone (DESYREL) 100 MG tablet Take 1 tablet (100 mg total) by mouth at bedtime as needed for sleep. For sleep Patient taking differently: Take 300 mg by  mouth at bedtime as needed for sleep. For sleep 10/23/13  Yes Sanjuana Kava, NP  venlafaxine XR (EFFEXOR-XR) 150 MG 24 hr capsule Take 1 capsule (150 mg total) by mouth daily with breakfast. For depression Patient taking differently: Take 150 mg by mouth 2 (two) times daily. For depression 10/23/13  Yes  Sanjuana Kava, NP  butalbital-acetaminophen-caffeine (FIORICET) 50-325-40 MG per tablet Take 1 tablet by mouth every 6 (six) hours as needed for headache. Patient not taking: Reported on 03/02/2014 02/19/14   Joni Reining Pisciotta, PA-C  diazepam (VALIUM) 10 MG tablet Take 1 tablet (10 mg total) by mouth 3 (three) times daily. For anxiety Patient not taking: Reported on 03/08/2014 10/23/13   Sanjuana Kava, NP  methocarbamol (ROBAXIN) 500 MG tablet Take 2 tablets (1,000 mg total) by mouth 4 (four) times daily as needed (Pain). Patient not taking: Reported on 03/02/2014 02/19/14   Joni Reining Pisciotta, PA-C  morphine (MS CONTIN) 15 MG 12 hr tablet Take 1 tablet (15 mg total) by mouth every 12 (twelve) hours. For severe pain Patient not taking: Reported on 12/25/2013 10/23/13   Sanjuana Kava, NP  oxyCODONE (OXY IR/ROXICODONE) 5 MG immediate release tablet Take 1 tablet (5 mg total) by mouth every 6 (six) hours as needed for severe pain or breakthrough pain. Patient not taking: Reported on 03/08/2014 10/23/13   Sanjuana Kava, NP  oxymorphone (OPANA ER) 15 MG 12 hr tablet Take 15 mg by mouth every 12 (twelve) hours.    Historical Provider, MD   BP 109/63 mmHg  Pulse 84  Temp(Src) 99.2 F (37.3 C) (Oral)  Resp 16  SpO2 99%  LMP 12/04/2010 Physical Exam  Constitutional: She is oriented to person, place, and time. She appears well-developed and well-nourished.  HENT:  Head: Normocephalic and atraumatic.  Right Ear: External ear normal.  Left Ear: External ear normal.  Mouth/Throat: Oropharynx is clear and moist.  Eyes: Conjunctivae are normal. Pupils are equal, round, and reactive to light.  Neck: Normal range of motion. Neck supple.  Cardiovascular: Normal rate, regular rhythm, normal heart sounds and intact distal pulses.   Pulmonary/Chest: Effort normal and breath sounds normal. No respiratory distress. She has no wheezes. She has no rales. She exhibits no tenderness.  Abdominal: Soft. Bowel sounds are normal.  She exhibits no distension and no mass. There is no tenderness. There is no rebound and no guarding.  Musculoskeletal: Normal range of motion.  Neurological: She is alert and oriented to person, place, and time.  CN II-XII intact.  No focal motor or sensory deficit to face, BUE, or BLE.  No meningeal signs.    Skin: Skin is warm and dry.  Nursing note and vitals reviewed.   ED Course  Procedures (including critical care time) Labs Review Labs Reviewed - No data to display  Imaging Review No results found.   EKG Interpretation None      MDM   Final diagnoses:  Other migraine without status migrainosus, not intractable   49 yo F hx of chronic migraines, Bipolar disorder, Borderline personality, presents with CC migraine.   Physical exam as above.  VS WNL.  Pt looks well, nontoxic, joking.  Non focal neurologic exam.  HA gradual onset, similar to prior migraines, no head trauma, normal head CT 1/31, unlikely intracranial process.  Pt afebrile, no meningeal signs, unlikely meningitis.    Pt states she cannot take Reglan, Benadryl, NSAIDs, Steroids 2/2 adverse reactions, allergy.  Pt also states she had paraspinal injection  last time she was here, which did not help, and would not want that again today.  Will give pt IVF, dilaudid, phenergan, and reevaluate.  5:48 PM  On reevaluation, pt states she is asymptomatic, and that treatment has helped.  States she is ready to go home at this time.   Diagnosis of migraine headache, resolved.  F/u with PCP in 1 week.  Recommend supportive care.  Return precautions given.  Pt understands and agrees with plan.   Jon GillsWebb, Cyndie Woodbeck  Discussed pt with my attending Dr. Gwendolyn GrantWalden.    Jon GillsZach Anuel Sitter, MD 03/09/14 [id number]  Elwin MochaBlair Walden, MD 03/09/14 [phone]

## 2014-03-08 NOTE — ED Notes (Signed)
Pt reports hx of migraines, having severe pain for several days with n/v. Pain is causing anxiety, denies SI or HI. Pt reports recently changing doctors and has appt made with pain clinic.

## 2014-03-12 ENCOUNTER — Emergency Department (HOSPITAL_COMMUNITY)
Admission: EM | Admit: 2014-03-12 | Discharge: 2014-03-12 | Disposition: A | Discharge status: Home / Self Care | Payer: Medicare Other | Attending: Emergency Medicine | Admitting: Emergency Medicine

## 2014-03-12 ENCOUNTER — Encounter (HOSPITAL_COMMUNITY): Payer: Self-pay | Admitting: Neurology

## 2014-03-12 DIAGNOSIS — Z880 Allergy status to penicillin: Secondary | ICD-10-CM | POA: Diagnosis not present

## 2014-03-12 DIAGNOSIS — G43909 Migraine, unspecified, not intractable, without status migrainosus: Secondary | ICD-10-CM | POA: Diagnosis present

## 2014-03-12 DIAGNOSIS — F329 Major depressive disorder, single episode, unspecified: Secondary | ICD-10-CM | POA: Diagnosis not present

## 2014-03-12 DIAGNOSIS — F419 Anxiety disorder, unspecified: Secondary | ICD-10-CM | POA: Diagnosis not present

## 2014-03-12 DIAGNOSIS — R519 Headache, unspecified: Secondary | ICD-10-CM

## 2014-03-12 DIAGNOSIS — F4310 Post-traumatic stress disorder, unspecified: Secondary | ICD-10-CM | POA: Diagnosis not present

## 2014-03-12 DIAGNOSIS — Z79899 Other long term (current) drug therapy: Secondary | ICD-10-CM | POA: Diagnosis not present

## 2014-03-12 DIAGNOSIS — R51 Headache: Secondary | ICD-10-CM

## 2014-03-12 DIAGNOSIS — Z720 Tobacco use: Secondary | ICD-10-CM | POA: Insufficient documentation

## 2014-03-12 DIAGNOSIS — E039 Hypothyroidism, unspecified: Secondary | ICD-10-CM | POA: Insufficient documentation

## 2014-03-12 DIAGNOSIS — R110 Nausea: Secondary | ICD-10-CM | POA: Diagnosis not present

## 2014-03-12 DIAGNOSIS — H53149 Visual discomfort, unspecified: Secondary | ICD-10-CM | POA: Insufficient documentation

## 2014-03-12 DIAGNOSIS — F909 Attention-deficit hyperactivity disorder, unspecified type: Secondary | ICD-10-CM | POA: Diagnosis not present

## 2014-03-12 DIAGNOSIS — G8929 Other chronic pain: Secondary | ICD-10-CM

## 2014-03-12 MED ORDER — PROMETHAZINE HCL 25 MG/ML IJ SOLN
25.0000 mg | Freq: Once | INTRAMUSCULAR | Status: AC
  Administered 2014-03-12: 25 mg via INTRAVENOUS
  Filled 2014-03-12: qty 1

## 2014-03-12 MED ORDER — HYDROMORPHONE HCL 1 MG/ML IJ SOLN
1.0000 mg | Freq: Once | INTRAMUSCULAR | Status: AC
  Administered 2014-03-12: 1 mg via INTRAVENOUS
  Filled 2014-03-12: qty 1

## 2014-03-12 MED ORDER — SODIUM CHLORIDE 0.9 % IV BOLUS (SEPSIS)
1000.0000 mL | Freq: Once | INTRAVENOUS | Status: AC
  Administered 2014-03-12: 1000 mL via INTRAVENOUS

## 2014-03-12 MED ORDER — PROMETHAZINE HCL 25 MG/ML IJ SOLN
12.5000 mg | Freq: Once | INTRAMUSCULAR | Status: AC
  Administered 2014-03-12: 12.5 mg via INTRAVENOUS
  Filled 2014-03-12: qty 1

## 2014-03-12 MED ORDER — HYDROMORPHONE HCL 1 MG/ML IJ SOLN
2.0000 mg | Freq: Once | INTRAMUSCULAR | Status: AC
  Administered 2014-03-12: 2 mg via INTRAVENOUS
  Filled 2014-03-12: qty 2

## 2014-03-12 NOTE — ED Notes (Signed)
Pt reports migraine h/a x2 days with n/v. Pt has no other neurological deficits.  Pt alert and oriented.

## 2014-03-12 NOTE — ED Provider Notes (Signed)
CSN: [id number]     Arrival date & time 03/12/14  1633 History   First MD Initiated Contact with Patient 03/12/14 1823     Chief Complaint  Patient presents with  . Migraine     (Consider location/radiation/quality/duration/timing/severity/associated sxs/prior Treatment) HPI Comments: 2 day history of recurrent diffuse "migraine headache" associated with nausea and vomiting and photophobia. Headache is similar to previous migraines. Denies thunderclap onset. Denies focal weakness, numbness or tingling. Denies fever. Patient reports she has a pain specialist appointment on March 7. 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 Recurrent ED visits for migraine headaches where she specifically requests Dilaudid and Phenergan.  The history is provided by the patient.    Past Medical History  Diagnosis Date  . Migraine   . PTSD (post-traumatic stress disorder)   . Anxiety   . Personality disorder   . Depression   . High cholesterol   . Hypothyroid   . ADHD (attention deficit hyperactivity disorder)    Past Surgical History  Procedure Laterality Date  . Cholecystectomy    . Mandible fracture surgery     No family history on file. History  Substance Use Topics  . Smoking status: Current Every Day Smoker -- 1.00 packs/day    Types: Cigarettes  . Smokeless tobacco: Not on file  . Alcohol Use: No   OB History    No data available     Review of Systems  Constitutional: Negative for fever, activity change and appetite change.  Eyes: Positive for photophobia.  Respiratory: Negative for cough and chest tightness.   Cardiovascular: Negative for chest pain.  Gastrointestinal: Positive for nausea. Negative for abdominal pain.  Genitourinary: Negative for dysuria, hematuria, vaginal bleeding and vaginal discharge.  Musculoskeletal: Negative for myalgias and arthralgias.  Neurological: Positive for headaches. Negative for  dizziness, weakness and light-headedness.  A complete 10 system review of systems was obtained and all systems are negative except as noted in the HPI and PMH.      Allergies  Compazine; Geodon; Imitrex; Metoclopramide hcl; Other; Penicillins; Wellbutrin; and Zofran  Home Medications   Prior to Admission medications   Medication Sig Start Date End Date Taking? Authorizing Provider  amphetamine-dextroamphetamine (ADDERALL) 10 MG tablet Take 1 tablet (10 mg total) by mouth 2 (two) times daily. For ADHD 10/23/13  Yes Sanjuana Kava, NP  diazepam (VALIUM) 10 MG tablet Take 1 tablet (10 mg total) by mouth 3 (three) times daily. For anxiety 10/23/13  Yes Sanjuana Kava, NP  haloperidol (HALDOL) 2 MG tablet Take 2 mg by mouth 2 (two) times daily.   Yes Historical Provider, MD  levothyroxine (SYNTHROID, LEVOTHROID) 125 MCG tablet Take 1 tablet (125 mcg total) by mouth daily before breakfast. For low thyroid function 10/23/13  Yes Sanjuana Kava, NP  lithium carbonate 300 MG capsule Take 1 capsule (300 mg total) by mouth 2 (two) times daily with a meal. For mood stabilization 10/23/13  Yes Sanjuana Kava, NP  traZODone (DESYREL) 100 MG tablet Take 1 tablet (100 mg total) by mouth at bedtime as needed for sleep. For sleep Patient taking differently: Take 300 mg by mouth at bedtime as needed for sleep. For sleep 10/23/13  Yes Sanjuana Kava, NP  venlafaxine XR (EFFEXOR-XR) 150 MG 24 hr capsule Take 1 capsule (150 mg total) by mouth daily with breakfast. For depression Patient taking differently: Take 150 mg by mouth 2 (  two) times daily. For depression 10/23/13  Yes Sanjuana KavaAgnes I Nwoko, NP  busPIRone (BUSPAR) 10 MG tablet Take 1 tablet (10 mg total) by mouth 2 (two) times daily. For anxiety Patient not taking: Reported on 03/12/2014 10/23/13   Sanjuana KavaAgnes I Nwoko, NP  butalbital-acetaminophen-caffeine (FIORICET) 315 154 [id number] MG per tablet Take 1 tablet by mouth every 6 (six) hours as needed for headache. Patient not taking:  Reported on 03/02/2014 02/19/14   Joni ReiningNicole Pisciotta, PA-C  methocarbamol (ROBAXIN) 500 MG tablet Take 2 tablets (1,000 mg total) by mouth 4 (four) times daily as needed (Pain). Patient not taking: Reported on 03/02/2014 02/19/14   Joni ReiningNicole Pisciotta, PA-C  morphine (MS CONTIN) 15 MG 12 hr tablet Take 1 tablet (15 mg total) by mouth every 12 (twelve) hours. For severe pain Patient not taking: Reported on 12/25/2013 10/23/13   Sanjuana KavaAgnes I Nwoko, NP  oxyCODONE (OXY IR/ROXICODONE) 5 MG immediate release tablet Take 1 tablet (5 mg total) by mouth every 6 (six) hours as needed for severe pain or breakthrough pain. Patient not taking: Reported on 03/08/2014 10/23/13   Sanjuana KavaAgnes I Nwoko, NP   BP 110/74 mmHg  Pulse 85  Temp(Src) 99.2 F (37.3 C)  Resp 17  SpO2 93%  LMP 12/04/2010 Physical Exam  Constitutional: She is oriented to person, place, and time. She appears well-developed and well-nourished. No distress.  HENT:  Head: Normocephalic and atraumatic.  Mouth/Throat: Oropharynx is clear and moist. No oropharyngeal exudate.  Photophobic No temporal artery tenderness  Eyes: Conjunctivae and EOM are normal. Pupils are equal, round, and reactive to light.  Neck: Normal range of motion. Neck supple.  No meningismus.  Cardiovascular: Normal rate, regular rhythm, normal heart sounds and intact distal pulses.   No murmur heard. Pulmonary/Chest: Effort normal and breath sounds normal. No respiratory distress.  Abdominal: Soft. There is no tenderness. There is no rebound and no guarding.  Musculoskeletal: Normal range of motion. She exhibits no edema or tenderness.  Neurological: She is alert and oriented to person, place, and time. No cranial nerve deficit. She exhibits normal muscle tone. Coordination normal.  No ataxia on finger to nose bilaterally. No pronator drift. 5/5 strength throughout. CN 2-12 intact. Negative Romberg. Equal grip strength. Sensation intact. Gait is normal.   Skin: Skin is warm.  Psychiatric:  She has a normal mood and affect. Her behavior is normal.  Nursing note and vitals reviewed.   ED Course  Procedures (including critical care time) Labs Review Labs Reviewed - No data to display  Imaging Review No results found.   EKG Interpretation None      MDM   Final diagnoses:  Chronic nonintractable headache, unspecified headache type   Gradual onset typical migraine headache similar to previous. Nontoxic-appearing, no meningismus, no fever. CT scan January 31 negative.  Nonfocal neurological exam. Patient reports allergies to multiple medications including Reglan, Benadryl, NSAIDs.  Headache improved with treatment in the ED. Low suspicion for cervical hemorrhage, meningitis or temporal arteritis. Patient informed that she is to follow-up with her pain clinic as previously scheduled. The ED will not continue to treat her chronic pain.   Glynn OctaveStephen Percy Comp, MD 03/12/14 [phone]

## 2014-03-12 NOTE — Discharge Instructions (Signed)
General Headache Without Cause Follow up with your pain management doctor. Return to the ED if you develop new or worsening symptoms. A headache is pain or discomfort felt around the head or neck area. The specific cause of a headache may not be found. There are many causes and types of headaches. A few common ones are:  Tension headaches.  Migraine headaches.  Cluster headaches.  Chronic daily headaches. HOME CARE INSTRUCTIONS   Keep all follow-up appointments with your caregiver or any specialist referral.  Only take over-the-counter or prescription medicines for pain or discomfort as directed by your caregiver.  Lie down in a dark, quiet room when you have a headache.  Keep a headache journal to find out what may trigger your migraine headaches. For example, write down:  What you eat and drink.  How much sleep you get.  Any change to your diet or medicines.  Try massage or other relaxation techniques.  Put ice packs or heat on the head and neck. Use these 3 to 4 times per day for 15 to 20 minutes each time, or as needed.  Limit stress.  Sit up straight, and do not tense your muscles.  Quit smoking if you smoke.  Limit alcohol use.  Decrease the amount of caffeine you drink, or stop drinking caffeine.  Eat and sleep on a regular schedule.  Get 7 to 9 hours of sleep, or as recommended by your caregiver.  Keep lights dim if bright lights bother you and make your headaches worse. SEEK MEDICAL CARE IF:   You have problems with the medicines you were prescribed.  Your medicines are not working.  You have a change from the usual headache.  You have nausea or vomiting. SEEK IMMEDIATE MEDICAL CARE IF:   Your headache becomes severe.  You have a fever.  You have a stiff neck.  You have loss of vision.  You have muscular weakness or loss of muscle control.  You start losing your balance or have trouble walking.  You feel faint or pass out.  You have  severe symptoms that are different from your first symptoms. MAKE SURE YOU:   Understand these instructions.  Will watch your condition.  Will get help right away if you are not doing well or get worse. Document Released: 01/04/2005 Document Revised: 03/29/2011 Document Reviewed: 01/20/2011 [facility] Patient Information 2015 [place], [place]. This information is not intended to replace advice given to you by your health care provider. Make sure you discuss any questions you have with your health care provider.

## 2014-03-12 NOTE — ED Notes (Signed)
Pt reports 2 days hx of migraine. No meds at home, is waiting for pain clinic appt. Pt is a x 4. Reports hx of migraines and this feels same. States dilaudid and phenergan will relieve her pain.

## 2014-03-15 ENCOUNTER — Emergency Department (HOSPITAL_COMMUNITY)
Admission: EM | Admit: 2014-03-15 | Discharge: 2014-03-15 | Disposition: A | Discharge status: Home / Self Care | Payer: Medicare Other | Attending: Emergency Medicine | Admitting: Emergency Medicine

## 2014-03-15 ENCOUNTER — Encounter (HOSPITAL_COMMUNITY): Payer: Self-pay | Admitting: Emergency Medicine

## 2014-03-15 DIAGNOSIS — E039 Hypothyroidism, unspecified: Secondary | ICD-10-CM | POA: Diagnosis not present

## 2014-03-15 DIAGNOSIS — R51 Headache: Secondary | ICD-10-CM | POA: Diagnosis present

## 2014-03-15 DIAGNOSIS — F329 Major depressive disorder, single episode, unspecified: Secondary | ICD-10-CM | POA: Insufficient documentation

## 2014-03-15 DIAGNOSIS — Z720 Tobacco use: Secondary | ICD-10-CM | POA: Diagnosis not present

## 2014-03-15 DIAGNOSIS — Z880 Allergy status to penicillin: Secondary | ICD-10-CM | POA: Insufficient documentation

## 2014-03-15 DIAGNOSIS — F909 Attention-deficit hyperactivity disorder, unspecified type: Secondary | ICD-10-CM | POA: Insufficient documentation

## 2014-03-15 DIAGNOSIS — F419 Anxiety disorder, unspecified: Secondary | ICD-10-CM | POA: Insufficient documentation

## 2014-03-15 DIAGNOSIS — G43709 Chronic migraine without aura, not intractable, without status migrainosus: Secondary | ICD-10-CM | POA: Insufficient documentation

## 2014-03-15 DIAGNOSIS — Z79899 Other long term (current) drug therapy: Secondary | ICD-10-CM | POA: Diagnosis not present

## 2014-03-15 DIAGNOSIS — G8929 Other chronic pain: Secondary | ICD-10-CM

## 2014-03-15 LAB — URINALYSIS, ROUTINE W REFLEX MICROSCOPIC
Bilirubin Urine: NEGATIVE
GLUCOSE, UA: NEGATIVE mg/dL
HGB URINE DIPSTICK: NEGATIVE
Ketones, ur: NEGATIVE mg/dL
Nitrite: NEGATIVE
PH: 7.5 (ref 5.0–8.0)
Protein, ur: NEGATIVE mg/dL
SPECIFIC GRAVITY, URINE: 1.006 (ref 1.005–1.030)
UROBILINOGEN UA: 0.2 mg/dL (ref 0.0–1.0)

## 2014-03-15 LAB — URINE MICROSCOPIC-ADD ON

## 2014-03-15 MED ORDER — FENTANYL CITRATE 0.05 MG/ML IJ SOLN
100.0000 ug | Freq: Once | INTRAMUSCULAR | Status: AC
  Administered 2014-03-15: 100 ug via INTRAVENOUS
  Filled 2014-03-15: qty 2

## 2014-03-15 MED ORDER — PROMETHAZINE HCL 25 MG/ML IJ SOLN
25.0000 mg | Freq: Once | INTRAMUSCULAR | Status: AC
  Administered 2014-03-15: 25 mg via INTRAVENOUS
  Filled 2014-03-15: qty 1

## 2014-03-15 MED ORDER — SODIUM CHLORIDE 0.9 % IV BOLUS (SEPSIS)
1000.0000 mL | Freq: Once | INTRAVENOUS | Status: AC
  Administered 2014-03-15: 1000 mL via INTRAVENOUS

## 2014-03-15 MED ORDER — FENTANYL CITRATE 0.05 MG/ML IJ SOLN: 50.0000 ug | Freq: Once | INTRAMUSCULAR | Status: DC

## 2014-03-15 MED ORDER — LORAZEPAM 2 MG/ML IJ SOLN
1.0000 mg | Freq: Once | INTRAMUSCULAR | Status: AC
  Administered 2014-03-15: 1 mg via INTRAVENOUS
  Filled 2014-03-15: qty 1

## 2014-03-15 MED ORDER — FENTANYL CITRATE 0.05 MG/ML IJ SOLN
50.0000 ug | Freq: Once | INTRAMUSCULAR | Status: AC
  Administered 2014-03-15: 50 ug via INTRAVENOUS
  Filled 2014-03-15: qty 2

## 2014-03-15 NOTE — ED Notes (Signed)
Pt here from chronic migraine worse x 2 days with pain into her neck; pt sts she has appt with pain clinc next month

## 2014-03-15 NOTE — ED Provider Notes (Signed)
CSN: [id number]     Arrival date & time 03/15/14  1352 History   First MD Initiated Contact with Patient 03/15/14 1438     Chief Complaint  Patient presents with  . Headache     (Consider location/radiation/quality/duration/timing/severity/associated sxs/prior Treatment) HPI Comments: Patient with a history of chronic headaches and frequent ED visits for headaches presents today with a chief complaint of headache.  She reports that the headache has been present since last evening.  Headache gradual in onset.  Headache is diffusely located across her head.  She states that the pain starts in the occipital area and migrates to the top of her head.  Headache associated with photophobia, photophonobia, nausea, and numerous episodes of vomiting.  She reports that the headache today is similar to headaches that she has had in the past.  She has not taken anything for pain prior to arrival.  Patient reports that Dilaudid is usually the only thing that helps with her pain.  She states that she has an appointment with pain management in 2 weeks.  She reports that she has been seen [facility] for these headaches and received injections, which she does not feel has helped.  The history is provided by the patient.    Past Medical History  Diagnosis Date  . Migraine   . PTSD (post-traumatic stress disorder)   . Anxiety   . Personality disorder   . Depression   . High cholesterol   . Hypothyroid   . ADHD (attention deficit hyperactivity disorder)    Past Surgical History  Procedure Laterality Date  . Cholecystectomy    . Mandible fracture surgery     History reviewed. No pertinent family history. History  Substance Use Topics  . Smoking status: Current Every Day Smoker -- 1.00 packs/day    Types: Cigarettes  . Smokeless tobacco: Not on file  . Alcohol Use: No   OB History    No data available     Review of Systems  All other systems reviewed and are  negative.     Allergies  Compazine; Geodon; Imitrex; Metoclopramide hcl; Other; Penicillins; Wellbutrin; and Zofran  Home Medications   Prior to Admission medications   Medication Sig Start Date End Date Taking? Authorizing Provider  amphetamine-dextroamphetamine (ADDERALL) 10 MG tablet Take 1 tablet (10 mg total) by mouth 2 (two) times daily. For ADHD 10/23/13   Sanjuana Kava, NP  busPIRone (BUSPAR) 10 MG tablet Take 1 tablet (10 mg total) by mouth 2 (two) times daily. For anxiety Patient not taking: Reported on 03/12/2014 10/23/13   Sanjuana Kava, NP  butalbital-acetaminophen-caffeine (FIORICET) [phone] MG per tablet Take 1 tablet by mouth every 6 (six) hours as needed for headache. Patient not taking: Reported on 03/02/2014 02/19/14   Joni Reining Pisciotta, PA-C  diazepam (VALIUM) 10 MG tablet Take 1 tablet (10 mg total) by mouth 3 (three) times daily. For anxiety 10/23/13   Sanjuana Kava, NP  haloperidol (HALDOL) 2 MG tablet Take 2 mg by mouth 2 (two) times daily.    Historical Provider, MD  levothyroxine (SYNTHROID, LEVOTHROID) 125 MCG tablet Take 1 tablet (125 mcg total) by mouth daily before breakfast. For low thyroid function 10/23/13   Sanjuana Kava, NP  lithium carbonate 300 MG capsule Take 1 capsule (300 mg total) by mouth 2 (two) times daily with a meal. For mood stabilization 10/23/13   Sanjuana Kava, NP  methocarbamol (ROBAXIN) 500 MG tablet Take  2 tablets (1,000 mg total) by mouth 4 (four) times daily as needed (Pain). Patient not taking: Reported on 03/02/2014 02/19/14   Joni ReiningNicole Pisciotta, PA-C  morphine (MS CONTIN) 15 MG 12 hr tablet Take 1 tablet (15 mg total) by mouth every 12 (twelve) hours. For severe pain Patient not taking: Reported on 12/25/2013 10/23/13   Sanjuana KavaAgnes I Nwoko, NP  oxyCODONE (OXY IR/ROXICODONE) 5 MG immediate release tablet Take 1 tablet (5 mg total) by mouth every 6 (six) hours as needed for severe pain or breakthrough pain. Patient not taking: Reported on 03/08/2014 10/23/13    Sanjuana KavaAgnes I Nwoko, NP  traZODone (DESYREL) 100 MG tablet Take 1 tablet (100 mg total) by mouth at bedtime as needed for sleep. For sleep Patient taking differently: Take 300 mg by mouth at bedtime as needed for sleep. For sleep 10/23/13   Sanjuana KavaAgnes I Nwoko, NP  venlafaxine XR (EFFEXOR-XR) 150 MG 24 hr capsule Take 1 capsule (150 mg total) by mouth daily with breakfast. For depression Patient taking differently: Take 150 mg by mouth 2 (two) times daily. For depression 10/23/13   Sanjuana KavaAgnes I Nwoko, NP   BP 120/68 mmHg  Pulse 71  Temp(Src) 99.2 F (37.3 C) (Oral)  Resp 18  SpO2 98%  LMP 12/04/2010 Physical Exam  Constitutional: She appears well-developed and well-nourished.  HENT:  Head: Normocephalic and atraumatic.  Eyes: EOM are normal. Pupils are equal, round, and reactive to light.  Neck: Normal range of motion. Neck supple.  Cardiovascular: Normal rate, regular rhythm and normal heart sounds.   Pulmonary/Chest: Effort normal and breath sounds normal.  Neurological: She is alert. She has normal strength. No cranial nerve deficit or sensory deficit. Coordination and gait normal.  Skin: Skin is warm and dry.  Psychiatric: She has a normal mood and affect.  Nursing note and vitals reviewed.   ED Course  Procedures (including critical care time) Labs Review Labs Reviewed  URINALYSIS, ROUTINE W REFLEX MICROSCOPIC    Imaging Review No results found.   EKG Interpretation None     4:50 PM Reassessed patient.  She reports some improvement in headache, but is requesting another dose.  Discussed with Dr. Radford PaxBeaton.  Will order one more dose of 50 mcg fentanyl. 5:15 PM Reassessed patient.  Pain has significantly improved.   MDM   Final diagnoses:  None   Patient with a history of Migraine Headaches with numerous ED visits presents today with a headache.  She reports that the headache is similar to headaches that she has had in the past.  Headache gradual in onset.  Normal neurological exam.   Headache improved in the ED.  Feel that the patient is stable for discharge.  Due to frequent ED visits patient informed that she will not receive Dilaudid today for headache.  Patient discussed with Dr. Radford PaxBeaton.  Message sent to Dr. Bebe ShaggyWickline for request to initiate care plan for the patient.      Santiago GladHeather Rock Sobol, PA-C 03/15/14 1730  Nelia Shiobert L Beaton, MD 03/24/14 [phone]

## 2014-03-15 NOTE — ED Notes (Signed)
Pt reports inc pain and nausea along with anxiety.  Heather, PA and Dr. Radford Paxbeaton notified.

## 2014-03-15 NOTE — ED Notes (Signed)
Pt made aware to return if symptoms worsen or if any life threatening symptoms occur.   

## 2014-03-15 NOTE — ED Notes (Signed)
Pt reports mirgraine and neck pain.  Pt was seen here this week for same symptoms.  Pt is to see pain clinic on 03/25/14.  Pt alert and oriented.

## 2014-03-18 ENCOUNTER — Emergency Department (HOSPITAL_COMMUNITY)
Admission: EM | Admit: 2014-03-18 | Discharge: 2014-03-18 | Disposition: A | Discharge status: Home / Self Care | Payer: Medicare Other | Attending: Emergency Medicine | Admitting: Emergency Medicine

## 2014-03-18 ENCOUNTER — Encounter (HOSPITAL_COMMUNITY): Payer: Self-pay | Admitting: Family Medicine

## 2014-03-18 DIAGNOSIS — F419 Anxiety disorder, unspecified: Secondary | ICD-10-CM | POA: Diagnosis not present

## 2014-03-18 DIAGNOSIS — F4310 Post-traumatic stress disorder, unspecified: Secondary | ICD-10-CM | POA: Insufficient documentation

## 2014-03-18 DIAGNOSIS — G43909 Migraine, unspecified, not intractable, without status migrainosus: Secondary | ICD-10-CM | POA: Diagnosis present

## 2014-03-18 DIAGNOSIS — Z79899 Other long term (current) drug therapy: Secondary | ICD-10-CM | POA: Insufficient documentation

## 2014-03-18 DIAGNOSIS — Z720 Tobacco use: Secondary | ICD-10-CM | POA: Insufficient documentation

## 2014-03-18 DIAGNOSIS — G43701 Chronic migraine without aura, not intractable, with status migrainosus: Secondary | ICD-10-CM | POA: Diagnosis not present

## 2014-03-18 DIAGNOSIS — F329 Major depressive disorder, single episode, unspecified: Secondary | ICD-10-CM | POA: Diagnosis not present

## 2014-03-18 DIAGNOSIS — Z880 Allergy status to penicillin: Secondary | ICD-10-CM | POA: Diagnosis not present

## 2014-03-18 DIAGNOSIS — E039 Hypothyroidism, unspecified: Secondary | ICD-10-CM | POA: Diagnosis not present

## 2014-03-18 MED ORDER — LORAZEPAM 2 MG/ML IJ SOLN
1.0000 mg | Freq: Once | INTRAMUSCULAR | Status: AC
  Administered 2014-03-18: 1 mg via INTRAMUSCULAR
  Filled 2014-03-18: qty 1

## 2014-03-18 MED ORDER — PROMETHAZINE HCL 25 MG/ML IJ SOLN
25.0000 mg | Freq: Once | INTRAMUSCULAR | Status: DC
  Filled 2014-03-18: qty 1

## 2014-03-18 MED ORDER — HYDROMORPHONE HCL 1 MG/ML IJ SOLN: 1.0000 mg | Freq: Once | INTRAMUSCULAR | Status: DC

## 2014-03-18 MED ORDER — KETOROLAC TROMETHAMINE 60 MG/2ML IM SOLN
60.0000 mg | Freq: Once | INTRAMUSCULAR | Status: AC
  Administered 2014-03-18: 60 mg via INTRAMUSCULAR
  Filled 2014-03-18: qty 2

## 2014-03-18 MED ORDER — SODIUM CHLORIDE 0.9 % IV BOLUS (SEPSIS): 1000.0000 mL | Freq: Once | INTRAVENOUS | Status: DC

## 2014-03-18 MED ORDER — PROMETHAZINE HCL 25 MG/ML IJ SOLN
25.0000 mg | Freq: Once | INTRAMUSCULAR | Status: AC
  Administered 2014-03-18: 25 mg via INTRAMUSCULAR
  Filled 2014-03-18: qty 1

## 2014-03-18 NOTE — ED Notes (Signed)
Spoke with PA mintz about pts care plan orders made by DR. Wickline. To verify  medication orders.

## 2014-03-18 NOTE — Discharge Instructions (Signed)
Recurrent Migraine Headache A migraine headache is an intense, throbbing pain on one or both sides of your head. Recurrent migraines keep coming back. A migraine can last for 30 minutes to several hours. CAUSES  The exact cause of a migraine headache is not always known. However, a migraine may be caused when nerves in the brain become irritated and release chemicals that cause inflammation. This causes pain. Certain things may also trigger migraines, such as:   Alcohol.  Smoking.  Stress.  Menstruation.  Aged cheeses.  Foods or drinks that contain nitrates, glutamate, aspartame, or tyramine.  Lack of sleep.  Chocolate.  Caffeine.  Hunger.  Physical exertion.  Fatigue.  Medicines used to treat chest pain (nitroglycerine), birth control pills, estrogen, and some blood pressure medicines. SYMPTOMS   Pain on one or both sides of your head.  Pulsating or throbbing pain.  Severe pain that prevents daily activities.  Pain that is aggravated by any physical activity.  Nausea, vomiting, or both.  Dizziness.  Pain with exposure to bright lights, loud noises, or activity.  General sensitivity to bright lights, loud noises, or smells. Before you get a migraine, you may get warning signs that a migraine is coming (aura). An aura may include:  Seeing flashing lights.  Seeing bright spots, halos, or zigzag lines.  Having tunnel vision or blurred vision.  Having feelings of numbness or tingling.  Having trouble talking.  Having muscle weakness. DIAGNOSIS  A recurrent migraine headache is often diagnosed based on:  Symptoms.  Physical examination.  A CT scan or MRI of your head. These imaging tests cannot diagnose migraines but can help rule out other causes of headaches.  TREATMENT  Medicines may be given for pain and nausea. Medicines can also be given to help prevent recurrent migraines. HOME CARE INSTRUCTIONS  Only take over-the-counter or prescription  medicines for pain or discomfort as directed by your health care provider. The use of long-term narcotics is not recommended.  Lie down in a dark, quiet room when you have a migraine.  Keep a journal to find out what may trigger your migraine headaches. For example, write down:  What you eat and drink.  How much sleep you get.  Any change to your diet or medicines.  Limit alcohol consumption.  Quit smoking if you smoke.  Get 7-9 hours of sleep, or as recommended by your health care provider.  Limit stress.  Keep lights dim if bright lights bother you and make your migraines worse. SEEK MEDICAL CARE IF:   You do not get relief from the medicines given to you.  You have a recurrence of pain.  You have a fever. SEEK IMMEDIATE MEDICAL CARE IF:  Your migraine becomes severe.  You have a stiff neck.  You have loss of vision.  You have muscular weakness or loss of muscle control.  You start losing your balance or have trouble walking.  You feel faint or pass out.  You have severe symptoms that are different from your first symptoms. MAKE SURE YOU:   Understand these instructions.  Will watch your condition.  Will get help right away if you are not doing well or get worse. Document Released: 09/29/2000 Document Revised: 05/21/2013 Document Reviewed: 09/11/2012 ExitCare Patient Information 2015 [employer]. This information is not intended to replace advice given to you by your health care provider. Make sure you discuss any questions you have with your health care provider.  

## 2014-03-18 NOTE — ED Notes (Signed)
PA mintz at bedside to explain plan of care with pt.

## 2014-03-18 NOTE — ED Provider Notes (Signed)
CSN: [id number]     Arrival date & time 03/18/14  1205 History   First MD Initiated Contact with Patient 03/18/14 1415     Chief Complaint  Patient presents with  . Migraine  . Anxiety     (Consider location/radiation/quality/duration/timing/severity/associated sxs/prior Treatment) HPI [name] is a 49 year old female with past medical history of migraines, anxiety who presents the ER complaining of headache and anxiety. Patient states she had acute onset of migraine which began at 3 AM this morning. Patient states she has a throbbing sensation in the back of her head which radiates down her neck. Patient reports associated nausea, vomiting, photophobia, phonophobia. Patient states this headache today is consistent and identical to previous migraine she has had in the past. Patient states she has follow-up with a headache specialist, however was unable to control her pain with medication at home. Patient states also since her headache began she has been feeling more anxious. Patient states she suffers from anxiety frequently, and her anxiety is typically associated with her headaches. Patient denies numbness, weakness, tingling, new aspect for headache, fever, rigidity of her neck, recent illness, chest pain, shortness of breath, blurred vision, dizziness.  Past Medical History  Diagnosis Date  . Migraine   . PTSD (post-traumatic stress disorder)   . Anxiety   . Personality disorder   . Depression   . High cholesterol   . Hypothyroid   . ADHD (attention deficit hyperactivity disorder)    Past Surgical History  Procedure Laterality Date  . Cholecystectomy    . Mandible fracture surgery     History reviewed. No pertinent family history. History  Substance Use Topics  . Smoking status: Current Every Day Smoker -- 1.00 packs/day    Types: Cigarettes  . Smokeless tobacco: Not on file  . Alcohol Use: No   OB History    No data available     Review of Systems   Constitutional: Negative for fever.  HENT: Negative for trouble swallowing.   Eyes: Negative for visual disturbance.  Respiratory: Negative for shortness of breath.   Cardiovascular: Negative for chest pain.  Gastrointestinal: Positive for nausea and vomiting. Negative for abdominal pain.  Genitourinary: Negative for dysuria.  Musculoskeletal: Negative for neck pain.  Skin: Negative for rash.  Neurological: Positive for headaches. Negative for dizziness, weakness and numbness.  Psychiatric/Behavioral: The patient is nervous/anxious.       Allergies  Compazine; Geodon; Imitrex; Metoclopramide hcl; Other; Penicillins; Wellbutrin; and Zofran  Home Medications   Prior to Admission medications   Medication Sig Start Date End Date Taking? Authorizing Provider  amphetamine-dextroamphetamine (ADDERALL) 10 MG tablet Take 1 tablet (10 mg total) by mouth 2 (two) times daily. For ADHD 10/23/13   Sanjuana Kava, NP  busPIRone (BUSPAR) 10 MG tablet Take 1 tablet (10 mg total) by mouth 2 (two) times daily. For anxiety Patient not taking: Reported on 03/12/2014 10/23/13   Sanjuana Kava, NP  butalbital-acetaminophen-caffeine (FIORICET) [phone] MG per tablet Take 1 tablet by mouth every 6 (six) hours as needed for headache. Patient not taking: Reported on 03/02/2014 02/19/14   Joni Reining Pisciotta, PA-C  diazepam (VALIUM) 10 MG tablet Take 1 tablet (10 mg total) by mouth 3 (three) times daily. For anxiety 10/23/13   Sanjuana Kava, NP  haloperidol (HALDOL) 2 MG tablet Take 2 mg by mouth 2 (two) times daily.    Historical Provider, MD  levothyroxine (SYNTHROID, LEVOTHROID) 125 MCG tablet Take 1 tablet (125 mcg total) by mouth daily  before breakfast. For low thyroid function 10/23/13   Sanjuana Kava, NP  lithium carbonate 300 MG capsule Take 1 capsule (300 mg total) by mouth 2 (two) times daily with a meal. For mood stabilization 10/23/13   Sanjuana Kava, NP  methocarbamol (ROBAXIN) 500 MG tablet Take 2 tablets  (1,000 mg total) by mouth 4 (four) times daily as needed (Pain). Patient not taking: Reported on 03/02/2014 02/19/14   Joni Reining Pisciotta, PA-C  morphine (MS CONTIN) 15 MG 12 hr tablet Take 1 tablet (15 mg total) by mouth every 12 (twelve) hours. For severe pain Patient not taking: Reported on 12/25/2013 10/23/13   Sanjuana Kava, NP  oxyCODONE (OXY IR/ROXICODONE) 5 MG immediate release tablet Take 1 tablet (5 mg total) by mouth every 6 (six) hours as needed for severe pain or breakthrough pain. Patient not taking: Reported on 03/08/2014 10/23/13   Sanjuana Kava, NP  traZODone (DESYREL) 100 MG tablet Take 1 tablet (100 mg total) by mouth at bedtime as needed for sleep. For sleep Patient taking differently: Take 300 mg by mouth at bedtime as needed for sleep. For sleep 10/23/13   Sanjuana Kava, NP  venlafaxine XR (EFFEXOR-XR) 150 MG 24 hr capsule Take 1 capsule (150 mg total) by mouth daily with breakfast. For depression Patient taking differently: Take 150 mg by mouth 2 (two) times daily. For depression 10/23/13   Sanjuana Kava, NP   BP 104/65 mmHg  Pulse 73  Temp(Src) 98.9 F (37.2 C) (Oral)  Resp 12  SpO2 96%  LMP 12/04/2010 Physical Exam  Constitutional: She is oriented to person, place, and time. She appears well-developed and well-nourished. No distress.  HENT:  Head: Normocephalic and atraumatic.  Mouth/Throat: Oropharynx is clear and moist. No oropharyngeal exudate.  Eyes: Pupils are equal, round, and reactive to light. Right eye exhibits no discharge. Left eye exhibits no discharge. No scleral icterus.  Neck: Normal range of motion and full passive range of motion without pain. Neck supple. No spinous process tenderness and no muscular tenderness present. No rigidity. Normal range of motion present.  Cardiovascular: Normal rate, regular rhythm and normal heart sounds.   No murmur heard. Pulmonary/Chest: Effort normal and breath sounds normal. No respiratory distress.  Abdominal: Soft. There  is no tenderness.  Musculoskeletal: Normal range of motion. She exhibits no edema or tenderness.  Neurological: She is alert and oriented to person, place, and time. She has normal strength. No cranial nerve deficit or sensory deficit. Coordination normal. GCS eye subscore is 4. GCS verbal subscore is 5. GCS motor subscore is 6.  Patient fully alert, answering questions appropriately in full, clear sentences. Cranial nerve II through XII grossly intact. Motor strength 5 out of 5 in all major muscle groups of upper and lower extremity. Distal sensation intact.  Skin: Skin is warm and dry. No rash noted. She is not diaphoretic.  Psychiatric: She has a normal mood and affect.  Nursing note and vitals reviewed.   ED Course  Procedures (including critical care time) Labs Review Labs Reviewed - No data to display  Imaging Review No results found.   EKG Interpretation None      MDM   Final diagnoses:  Chronic migraine without aura with status migrainosus, not intractable    Patient here for ongoing, chronic migraines, requesting Dilaudid for pain. Pain is consistent with headaches in the past, neuro exam benign. Patient has not had follow-up with outpatient headache specialist as of yet. Patient states she  has appointment for next Monday. I reviewed patient's ED care plan with her and stated that narcotics would no longer be given to her based on this plan and based on the fact that this is a chronic problem and patient is not having any signs or symptoms consistent with a neurologic emergency. After speaking with patient about this, she states she is in fact able to take Toradol, we'll give her IM Toradol for her pain.  Pt HA treated and improved while in ED.  Presentation is like pts typical HA and non concerning for [facility]AH, ICH, Meningitis, or temporal arteritis. Pt is afebrile with no focal neuro deficits, nuchal rigidity, or change in vision. Pt is to follow up with PCP to discuss  prophylactic medication. Pt verbalizes understanding and is agreeable with plan to dc.   BP 104/65 mmHg  Pulse 73  Temp(Src) 98.9 F (37.2 C) (Oral)  Resp 12  SpO2 96%  LMP 12/04/2010  Signed,  Ladona MowJoe Joellen Tullos, PA-C 5:48 PM  Patient seen and discussed with Dr. Tilden FossaElizabeth Rees, MD.    Monte FantasiaJoseph W Theodore Virgin, PA-C 03/18/14 1748  Tilden FossaElizabeth Rees, MD 03/19/14 [phone]

## 2014-03-18 NOTE — ED Notes (Signed)
Pt here for migraine with nausea since 3 am. sts also severe anxiety.

## 2014-04-09 ENCOUNTER — Emergency Department (HOSPITAL_COMMUNITY)
Admission: EM | Admit: 2014-04-09 | Discharge: 2014-04-10 | Disposition: A | Discharge status: Home / Self Care | Payer: Medicare Other | Attending: Emergency Medicine | Admitting: Emergency Medicine

## 2014-04-09 ENCOUNTER — Encounter (HOSPITAL_COMMUNITY): Payer: Self-pay | Admitting: Emergency Medicine

## 2014-04-09 DIAGNOSIS — Z79899 Other long term (current) drug therapy: Secondary | ICD-10-CM | POA: Diagnosis not present

## 2014-04-09 DIAGNOSIS — R45851 Suicidal ideations: Secondary | ICD-10-CM | POA: Diagnosis present

## 2014-04-09 DIAGNOSIS — R519 Headache, unspecified: Secondary | ICD-10-CM

## 2014-04-09 DIAGNOSIS — Z720 Tobacco use: Secondary | ICD-10-CM | POA: Insufficient documentation

## 2014-04-09 DIAGNOSIS — E039 Hypothyroidism, unspecified: Secondary | ICD-10-CM | POA: Insufficient documentation

## 2014-04-09 DIAGNOSIS — G43909 Migraine, unspecified, not intractable, without status migrainosus: Secondary | ICD-10-CM | POA: Diagnosis not present

## 2014-04-09 DIAGNOSIS — R112 Nausea with vomiting, unspecified: Secondary | ICD-10-CM | POA: Insufficient documentation

## 2014-04-09 DIAGNOSIS — R51 Headache: Secondary | ICD-10-CM

## 2014-04-09 DIAGNOSIS — R45850 Homicidal ideations: Secondary | ICD-10-CM | POA: Diagnosis not present

## 2014-04-09 DIAGNOSIS — Z880 Allergy status to penicillin: Secondary | ICD-10-CM | POA: Diagnosis not present

## 2014-04-09 LAB — COMPREHENSIVE METABOLIC PANEL
ALBUMIN: 3.8 g/dL (ref 3.5–5.2)
ALT: 9 U/L (ref 0–35)
AST: 14 U/L (ref 0–37)
Alkaline Phosphatase: 72 U/L (ref 39–117)
Anion gap: 10 (ref 5–15)
BUN: 8 mg/dL (ref 6–23)
CALCIUM: 9.6 mg/dL (ref 8.4–10.5)
CO2: 22 mmol/L (ref 19–32)
CREATININE: 0.86 mg/dL (ref 0.50–1.10)
Chloride: 105 mmol/L (ref 96–112)
GFR calc Af Amer: 90 mL/min — ABNORMAL LOW (ref >90)
GFR calc non Af Amer: 77 mL/min — ABNORMAL LOW (ref >90)
Glucose, Bld: 84 mg/dL (ref 70–99)
Potassium: 4 mmol/L (ref 3.5–5.1)
Sodium: 137 mmol/L (ref 135–145)
Total Bilirubin: 0.6 mg/dL (ref 0.3–1.2)
Total Protein: 6.5 g/dL (ref 6.0–8.3)

## 2014-04-09 LAB — CBC
HCT: 39.9 % (ref 36.0–46.0)
Hemoglobin: 13.9 g/dL (ref 12.0–15.0)
MCH: 33.4 pg (ref 26.0–34.0)
MCHC: 34.8 g/dL (ref 30.0–36.0)
MCV: 95.9 fL (ref 78.0–100.0)
PLATELETS: 236 10*3/uL (ref 150–400)
RBC: 4.16 MIL/uL (ref 3.87–5.11)
RDW: 13.5 % (ref 11.5–15.5)
WBC: 9.3 10*3/uL (ref 4.0–10.5)

## 2014-04-09 LAB — ACETAMINOPHEN LEVEL: Acetaminophen (Tylenol), Serum: <10 ug/mL — ABNORMAL LOW (ref 10–30)

## 2014-04-09 LAB — SALICYLATE LEVEL

## 2014-04-09 LAB — LITHIUM LEVEL: Lithium Lvl: 0.23 mmol/L — ABNORMAL LOW (ref 0.80–1.40)

## 2014-04-09 LAB — ETHANOL: Alcohol, Ethyl (B): <5 mg/dL (ref 0–9)

## 2014-04-09 MED ORDER — PROMETHAZINE HCL 25 MG/ML IJ SOLN
12.5000 mg | Freq: Once | INTRAMUSCULAR | Status: AC
  Administered 2014-04-09: 12.5 mg via INTRAVENOUS
  Filled 2014-04-09: qty 1

## 2014-04-09 MED ORDER — PRAZOSIN HCL 2 MG PO CAPS
2.0000 mg | ORAL_CAPSULE | Freq: Every day | ORAL | Status: DC
  Filled 2014-04-09 (×2): qty 1

## 2014-04-09 MED ORDER — ALUM & MAG HYDROXIDE-SIMETH 200-200-20 MG/5ML PO SUSP
30.0000 mL | ORAL | Status: DC | PRN
  Administered 2014-04-09: 30 mL via ORAL
  Filled 2014-04-09: qty 30

## 2014-04-09 MED ORDER — NICOTINE 21 MG/24HR TD PT24
21.0000 mg | MEDICATED_PATCH | Freq: Every day | TRANSDERMAL | Status: DC
  Administered 2014-04-09: 21 mg via TRANSDERMAL
  Filled 2014-04-09: qty 1

## 2014-04-09 MED ORDER — LEVOTHYROXINE SODIUM 125 MCG PO TABS
125.0000 ug | ORAL_TABLET | Freq: Every day | ORAL | Status: DC
  Filled 2014-04-09 (×2): qty 1

## 2014-04-09 MED ORDER — LITHIUM CARBONATE 300 MG PO CAPS: 300.0000 mg | ORAL_CAPSULE | Freq: Two times a day (BID) | ORAL | Status: DC

## 2014-04-09 MED ORDER — KETOROLAC TROMETHAMINE 30 MG/ML IJ SOLN
30.0000 mg | Freq: Once | INTRAMUSCULAR | Status: AC
  Administered 2014-04-09: 30 mg via INTRAVENOUS
  Filled 2014-04-09: qty 1

## 2014-04-09 MED ORDER — BUSPIRONE HCL 10 MG PO TABS
10.0000 mg | ORAL_TABLET | Freq: Two times a day (BID) | ORAL | Status: DC
  Filled 2014-04-09: qty 1

## 2014-04-09 MED ORDER — SODIUM CHLORIDE 0.9 % IV BOLUS (SEPSIS)
1000.0000 mL | Freq: Once | INTRAVENOUS | Status: AC
  Administered 2014-04-09: 1000 mL via INTRAVENOUS

## 2014-04-09 MED ORDER — BUTALBITAL-APAP-CAFFEINE 50-325-40 MG PO TABS
1.0000 | ORAL_TABLET | Freq: Four times a day (QID) | ORAL | Status: DC | PRN
  Filled 2014-04-09: qty 1

## 2014-04-09 MED ORDER — LORAZEPAM 2 MG/ML IJ SOLN
1.0000 mg | Freq: Once | INTRAMUSCULAR | Status: AC
  Administered 2014-04-09: 1 mg via INTRAVENOUS
  Filled 2014-04-09: qty 1
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
## 2014-04-09 NOTE — ED Notes (Signed)
PA O'malley made aware of patients unchanged pain and anxiety.

## 2014-04-09 NOTE — ED Notes (Signed)
Pt to ED with c/o SI and HI.  Pt also c/o migraine headache with nausea.  St's she feels anxious and stressed out.

## 2014-04-09 NOTE — ED Notes (Addendum)
Patient placed in paper scrubs and wanded by security, Staffing called for SI sitter, Charge RN made aware.

## 2014-04-09 NOTE — ED Notes (Signed)
Staffing and charge notified of patient in department.

## 2014-04-09 NOTE — ED Notes (Signed)
PA at bedside.
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
## 2014-04-10 DIAGNOSIS — R45851 Suicidal ideations: Secondary | ICD-10-CM | POA: Diagnosis not present

## 2014-04-10 NOTE — ED Notes (Addendum)
Sitter at bedside out to tell RN that pt is "having a panic attack" and "pt sts she is having chest pain".  MD made aware.  EKG completed on pt and provided to Dr. Norlene Campbelltter.    While RN in room pt calm, no diaphoresis noted, no increased work of breathing noted.  Pt asked RN to "turn off the light for my headache" while trying to set up for EKG.  RN made pt aware of no medications ordered at this time for her pain.  RN encouraged pt to deep breathe and "focus on now".  Pt thanked Charity fundraiserN when leaving room.

## 2014-04-10 NOTE — ED Notes (Signed)
Pt signed no harm contract 
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
## 2014-04-10 NOTE — Discharge Instructions (Signed)
Recurrent Migraine Headache A migraine headache is an intense, throbbing pain on one or both sides of your head. Recurrent migraines keep coming back. A migraine can last for 30 minutes to several hours. CAUSES  The exact cause of a migraine headache is not always known. However, a migraine may be caused when nerves in the brain become irritated and release chemicals that cause inflammation. This causes pain. Certain things may also trigger migraines, such as:   Alcohol.  Smoking.  Stress.  Menstruation.  Aged cheeses.  Foods or drinks that contain nitrates, glutamate, aspartame, or tyramine.  Lack of sleep.  Chocolate.  Caffeine.  Hunger.  Physical exertion.  Fatigue.  Medicines used to treat chest pain (nitroglycerine), birth control pills, estrogen, and some blood pressure medicines. SYMPTOMS   Pain on one or both sides of your head.  Pulsating or throbbing pain.  Severe pain that prevents daily activities.  Pain that is aggravated by any physical activity.  Nausea, vomiting, or both.  Dizziness.  Pain with exposure to bright lights, loud noises, or activity.  General sensitivity to bright lights, loud noises, or smells. Before you get a migraine, you may get warning signs that a migraine is coming (aura). An aura may include:  Seeing flashing lights.  Seeing bright spots, halos, or zigzag lines.  Having tunnel vision or blurred vision.  Having feelings of numbness or tingling.  Having trouble talking.  Having muscle weakness. DIAGNOSIS  A recurrent migraine headache is often diagnosed based on:  Symptoms.  Physical examination.  A CT scan or MRI of your head. These imaging tests cannot diagnose migraines but can help rule out other causes of headaches.  TREATMENT  Medicines may be given for pain and nausea. Medicines can also be given to help prevent recurrent migraines. HOME CARE INSTRUCTIONS  Only take over-the-counter or prescription  medicines for pain or discomfort as directed by your health care provider. The use of long-term narcotics is not recommended.  Lie down in a dark, quiet room when you have a migraine.  Keep a journal to find out what may trigger your migraine headaches. For example, write down:  What you eat and drink.  How much sleep you get.  Any change to your diet or medicines.  Limit alcohol consumption.  Quit smoking if you smoke.  Get 7-9 hours of sleep, or as recommended by your health care provider.  Limit stress.  Keep lights dim if bright lights bother you and make your migraines worse. SEEK MEDICAL CARE IF:   You do not get relief from the medicines given to you.  You have a recurrence of pain.  You have a fever. SEEK IMMEDIATE MEDICAL CARE IF:  Your migraine becomes severe.  You have a stiff neck.  You have loss of vision.  You have muscular weakness or loss of muscle control.  You start losing your balance or have trouble walking.  You feel faint or pass out.  You have severe symptoms that are different from your first symptoms. MAKE SURE YOU:   Understand these instructions.  Will watch your condition.  Will get help right away if you are not doing well or get worse. Document Released: 09/29/2000 Document Revised: 05/21/2013 Document Reviewed: 09/11/2012 [facility] Patient Information 2015 [place], [place]. This information is not intended to replace advice given to you by your health care provider. Make sure you discuss any questions you have with your health care provider.    Suicidal Feelings, How to Help Yourself  Everyone feels sad or unhappy at times, but depressing thoughts and feelings of hopelessness can lead to thoughts of suicide. It can seem as if life is too tough to handle. If you feel as though you have reached the point where suicide is the only answer, it is time to let someone know immediately.  HOW TO COPE AND PREVENT SUICIDE  Let family,  friends, teachers, or counselors know. Get help. Try not to isolate yourself from those who care about you. Even though you may not feel sociable, talk with someone every day. It is best if it is face-to-face. Remember, they will want to help you.  Eat a regularly spaced and well-balanced diet.  Get plenty of rest.  Avoid alcohol and drugs because they will only make you feel worse and may also lower your inhibitions. Remove them from the home. If you are thinking of taking an overdose of your prescribed medicines, give your medicines to someone who can give them to you one day at a time. If you are on antidepressants, let your caregiver know of your feelings so he or she can provide a safer medicine, if that is a concern.  Remove weapons or poisons from your home.  Try to stick to routines. Follow a schedule and remind yourself that you have to keep that schedule every day.  Set some realistic goals and achieve them. Make a list and cross things off as you go. Accomplishments give a sense of worth. Wait until you are feeling better before doing things you find difficult or unpleasant to do.  If you are able, try to start exercising. Even half-hour periods of exercise each day will make you feel better. Getting out in the sun or into nature helps you recover from depression faster. If you have a favorite place to walk, take advantage of that.  Increase safe activities that have always given you pleasure. This may include playing your favorite music, reading a good book, painting a picture, or playing your favorite instrument. Do whatever takes your mind off your depression.  Keep your living space well-lighted. GET HELP Contact a suicide hotline, crisis center, or local suicide prevention center for help right away. Local centers may include a hospital, clinic, community service organization, social service provider, or health department.  Call your local emergency services (911 in the [place]).  Call a suicide hotline:  1-800-273-TALK (681-879-57101-4231972707) in the [place].  1-800-SUICIDE 785-115-8220(1-915-159-7569) in the [place].  (218) 092-17321-(828)230-9864 in the [place] for Spanish-speaking counselors.  2-841-324-4WNU1-800-799-4TTY 413-339-9701(1-(865)554-3689) in the [place] for TTY users.  Visit the following websites for information and help:  National Suicide Prevention Lifeline: www.suicidepreventionlifeline.org  Hopeline: www.hopeline.com  [employer] for Suicide Prevention: https://www.ayers.com/www.afsp.org  For lesbian, gay, bisexual, transgender, or questioning youth, contact [employer]:  4-742-5-Z-DGLOVF1-866-4-U-TREVOR (256)288-9647(1-(785)090-5418) in the [place].  www.thetrevorproject.org  In [place], treatment resources are listed in each province with listings available under [employer] or similar titles. Another source for Crisis Centres by [place] is located at http://www.suicideprevention.ca/in-crisis-now/find-a-crisis-centre-now/crisis-centres Document Released: 07/11/2002 Document Revised: 03/29/2011 Document Reviewed: 05/01/2013 [facility] Patient Information 2015 [place], [place]. This information is not intended to replace advice given to you by your health care provider. Make sure you discuss any questions you have with your health care provider.

## 2014-04-10 NOTE — ED Notes (Signed)
TTS in progress 

## 2015-01-07 ENCOUNTER — Emergency Department (HOSPITAL_COMMUNITY)
Admission: EM | Admit: 2015-01-07 | Discharge: 2015-01-07 | Discharge status: Against Medical Advice | Payer: Medicare Other | Attending: Emergency Medicine | Admitting: Emergency Medicine

## 2015-01-07 ENCOUNTER — Encounter (HOSPITAL_COMMUNITY): Payer: Self-pay | Admitting: Nurse Practitioner

## 2015-01-07 DIAGNOSIS — R51 Headache: Secondary | ICD-10-CM | POA: Insufficient documentation

## 2015-01-07 DIAGNOSIS — R110 Nausea: Secondary | ICD-10-CM | POA: Diagnosis not present

## 2015-01-07 DIAGNOSIS — F17210 Nicotine dependence, cigarettes, uncomplicated: Secondary | ICD-10-CM | POA: Diagnosis not present

## 2015-01-07 NOTE — ED Notes (Signed)
Pt states that she is not going to wait. Pt ambulatory and in nad.

## 2015-01-07 NOTE — ED Notes (Signed)
Pt reprots migraine onset this am with light sensitivity and nausea. She does not have any meds at home for pain, states she can not afford any otc meds. She is A&Ox4, resp e/u

## 2015-01-13 ENCOUNTER — Emergency Department (HOSPITAL_COMMUNITY): Payer: Medicare Other

## 2015-01-13 ENCOUNTER — Encounter (HOSPITAL_COMMUNITY): Payer: Self-pay | Admitting: Emergency Medicine

## 2015-01-13 ENCOUNTER — Emergency Department (HOSPITAL_COMMUNITY)
Admission: EM | Admit: 2015-01-13 | Discharge: 2015-01-13 | Disposition: A | Discharge status: Home / Self Care | Payer: Medicare Other | Attending: Emergency Medicine | Admitting: Emergency Medicine

## 2015-01-13 DIAGNOSIS — Z79899 Other long term (current) drug therapy: Secondary | ICD-10-CM | POA: Diagnosis not present

## 2015-01-13 DIAGNOSIS — R110 Nausea: Secondary | ICD-10-CM | POA: Diagnosis not present

## 2015-01-13 DIAGNOSIS — E039 Hypothyroidism, unspecified: Secondary | ICD-10-CM | POA: Insufficient documentation

## 2015-01-13 DIAGNOSIS — F609 Personality disorder, unspecified: Secondary | ICD-10-CM | POA: Diagnosis not present

## 2015-01-13 DIAGNOSIS — F4310 Post-traumatic stress disorder, unspecified: Secondary | ICD-10-CM | POA: Insufficient documentation

## 2015-01-13 DIAGNOSIS — F17210 Nicotine dependence, cigarettes, uncomplicated: Secondary | ICD-10-CM | POA: Diagnosis not present

## 2015-01-13 DIAGNOSIS — Z9889 Other specified postprocedural states: Secondary | ICD-10-CM | POA: Insufficient documentation

## 2015-01-13 DIAGNOSIS — F419 Anxiety disorder, unspecified: Secondary | ICD-10-CM | POA: Diagnosis not present

## 2015-01-13 DIAGNOSIS — F329 Major depressive disorder, single episode, unspecified: Secondary | ICD-10-CM | POA: Diagnosis not present

## 2015-01-13 DIAGNOSIS — G43909 Migraine, unspecified, not intractable, without status migrainosus: Secondary | ICD-10-CM | POA: Insufficient documentation

## 2015-01-13 DIAGNOSIS — R079 Chest pain, unspecified: Secondary | ICD-10-CM | POA: Diagnosis present

## 2015-01-13 DIAGNOSIS — M549 Dorsalgia, unspecified: Secondary | ICD-10-CM | POA: Diagnosis not present

## 2015-01-13 DIAGNOSIS — Z880 Allergy status to penicillin: Secondary | ICD-10-CM | POA: Diagnosis not present

## 2015-01-13 DIAGNOSIS — I2510 Atherosclerotic heart disease of native coronary artery without angina pectoris: Secondary | ICD-10-CM | POA: Insufficient documentation

## 2015-01-13 LAB — CBC
HCT: 40.4 % (ref 36.0–46.0)
Hemoglobin: 13.5 g/dL (ref 12.0–15.0)
MCH: 33.5 pg (ref 26.0–34.0)
MCHC: 33.4 g/dL (ref 30.0–36.0)
MCV: 100.2 fL — ABNORMAL HIGH (ref 78.0–100.0)
PLATELETS: 228 10*3/uL (ref 150–400)
RBC: 4.03 MIL/uL (ref 3.87–5.11)
RDW: 13.8 % (ref 11.5–15.5)
WBC: 11.8 10*3/uL — AB (ref 4.0–10.5)

## 2015-01-13 LAB — I-STAT TROPONIN, ED
TROPONIN I, POC: 0 ng/mL (ref 0.00–0.08)
Troponin i, poc: 0 ng/mL (ref 0.00–0.08)

## 2015-01-13 LAB — BASIC METABOLIC PANEL
Anion gap: 8 (ref 5–15)
BUN: <5 mg/dL — ABNORMAL LOW (ref 6–20)
CALCIUM: 9.7 mg/dL (ref 8.9–10.3)
CO2: 24 mmol/L (ref 22–32)
CREATININE: 0.93 mg/dL (ref 0.44–1.00)
Chloride: 105 mmol/L (ref 101–111)
Glucose, Bld: 95 mg/dL (ref 65–99)
Potassium: 4 mmol/L (ref 3.5–5.1)
SODIUM: 137 mmol/L (ref 135–145)

## 2015-01-13 MED ORDER — HYDROCODONE-ACETAMINOPHEN 5-325 MG PO TABS
2.0000 | ORAL_TABLET | Freq: Once | ORAL | Status: AC
  Administered 2015-01-13: 2 via ORAL
  Filled 2015-01-13: qty 2

## 2015-01-13 MED ORDER — HYDROCODONE-ACETAMINOPHEN 5-325 MG PO TABS: 2.0000 | ORAL_TABLET | ORAL | Status: AC | PRN

## 2015-01-13 MED ORDER — KETOROLAC TROMETHAMINE 30 MG/ML IJ SOLN
30.0000 mg | Freq: Once | INTRAMUSCULAR | Status: AC
  Administered 2015-01-13: 30 mg via INTRAVENOUS
  Filled 2015-01-13: qty 1

## 2015-01-13 MED ORDER — CYCLOBENZAPRINE HCL 10 MG PO TABS: 10.0000 mg | ORAL_TABLET | Freq: Two times a day (BID) | ORAL | Status: AC | PRN

## 2015-01-13 MED ORDER — SODIUM CHLORIDE 0.9 % IV BOLUS (SEPSIS)
1000.0000 mL | Freq: Once | INTRAVENOUS | Status: AC
Start: 2015-01-13 — End: 2015-01-13
  Administered 2015-01-13: 1000 mL via INTRAVENOUS

## 2015-01-13 MED ORDER — CYCLOBENZAPRINE HCL 10 MG PO TABS
10.0000 mg | ORAL_TABLET | Freq: Once | ORAL | Status: AC
  Administered 2015-01-13: 10 mg via ORAL
  Filled 2015-01-13: qty 1

## 2015-01-13 MED ORDER — DIAZEPAM 5 MG/ML IJ SOLN
2.5000 mg | Freq: Once | INTRAMUSCULAR | Status: AC
  Administered 2015-01-13: 2.5 mg via INTRAVENOUS
  Filled 2015-01-13: qty 2

## 2015-01-13 NOTE — ED Notes (Signed)
Family at bedside. 

## 2015-01-13 NOTE — ED Notes (Signed)
Patient transported to X-ray 
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
## 2015-01-13 NOTE — ED Provider Notes (Signed)
CSN: [id number]     Arrival date & time 01/13/15  1559 History   First MD Initiated Contact with Patient 01/13/15 1628     Chief Complaint  Patient presents with  . Chest Pain    The patient chest pain with any movement, palpation and upon deep breath.  She is also complaining of nausea.  She advised EMS that she took some [facility] to the [facility] and started having chest pain.     (Consider location/radiation/quality/duration/timing/severity/associated sxs/prior Treatment) Patient is a 49 y.o. female presenting with chest pain. The history is provided by the patient.  Chest Pain Pain location:  Substernal area Pain quality: pressure   Pain radiates to:  Mid back, L shoulder, neck and L arm Onset quality:  Gradual Duration:  4 hours Timing:  Constant Progression:  Worsening Chronicity:  Recurrent Context comment:  After strenuous activity lifting, but began after she was done, at rest Associated symptoms: anxiety, back pain and nausea   Associated symptoms: no cough, no diaphoresis, no fever, no lower extremity edema, no near-syncope, no numbness, no orthopnea, no palpitations, no PND, no shortness of breath, no syncope, not vomiting and no weakness   Risk factors: coronary artery disease and smoking   Risk factors: no aortic disease, no birth control, no diabetes mellitus, no high cholesterol, no hypertension, no immobilization and no prior DVT/PE     [name] is a 49 y.o. female , current smoker with 38-pack-year history, presents to the emergency department with substernal chest pain described as a pressure, "an elephant sitting on her chest" that began at approximately 2 PM today, began while she was resting immediately after a lot of exertion lifting several heavy bags of clothes. The pain gradually worsened, with radiation to her back shoulder arm and neck, it is worsened with movement, palpation of the chest, deep breathing.  The patient denies any shortness of breath,  cough, fever, palpitations, LE edema, diaphoresis, lightheadedness, nausea, vomiting, abdominal pain.  She denies any history of hypertension or hyperlipidemia. She reports that she has had chest pain in the past that does feel similar to this. She reports a treadmill stress test in the past and 2 cardiac catheters which did not require intervention although there was a small amount of atherosclerosis. She states that this was done in another facility was approximately 2-3 years ago.  She does have anxiety which she denies any current treatment of.  She has not other acute complaints.   Past Medical History  Diagnosis Date  . Migraine   . PTSD (post-traumatic stress disorder)   . Anxiety   . Personality disorder   . Depression   . High cholesterol   . Hypothyroid   . ADHD (attention deficit hyperactivity disorder)    Past Surgical History  Procedure Laterality Date  . Cholecystectomy    . Mandible fracture surgery     History reviewed. No pertinent family history. Social History  Substance Use Topics  . Smoking status: Current Every Day Smoker -- 1.00 packs/day    Types: Cigarettes  . Smokeless tobacco: None  . Alcohol Use: No   OB History    No data available     Review of Systems  Constitutional: Negative.  Negative for fever and diaphoresis.  HENT: Negative.   Respiratory: Negative for cough and shortness of breath.   Cardiovascular: Positive for chest pain. Negative for palpitations, orthopnea, syncope, PND and near-syncope.  Gastrointestinal: Positive for nausea. Negative for vomiting.  Genitourinary: Negative.  Musculoskeletal: Positive for back pain.  Skin: Negative.   Neurological: Negative.  Negative for weakness and numbness.  Psychiatric/Behavioral: The patient is nervous/anxious.   All other systems reviewed and are negative.     Allergies  Compazine; Geodon; Imitrex; Metoclopramide hcl; Other; Penicillins; Wellbutrin; and Zofran  Home Medications    Prior to Admission medications   Medication Sig Start Date End Date Taking? Authorizing Provider  amphetamine-dextroamphetamine (ADDERALL) 10 MG tablet Take 1 tablet (10 mg total) by mouth 2 (two) times daily. For ADHD 10/23/13  Yes Sanjuana KavaAgnes I Nwoko, NP  clonazePAM (KLONOPIN) 1 MG tablet Take 1 mg by mouth 2 (two) times daily as needed for anxiety.   Yes Historical Provider, MD  haloperidol (HALDOL) 2 MG tablet Take 2 mg by mouth 3 (three) times daily.    Yes Historical Provider, MD  levothyroxine (SYNTHROID, LEVOTHROID) 125 MCG tablet Take 1 tablet (125 mcg total) by mouth daily before breakfast. For low thyroid function 10/23/13  Yes Sanjuana KavaAgnes I Nwoko, NP  lithium carbonate (ESKALITH) 450 MG CR tablet Take 900 mg by mouth at bedtime.   Yes Historical Provider, MD  prazosin (MINIPRESS) 2 MG capsule Take 2 mg by mouth at bedtime. 12/17/14  Yes Historical Provider, MD  traZODone (DESYREL) 150 MG tablet Take 300 mg by mouth at bedtime.   Yes Historical Provider, MD  venlafaxine XR (EFFEXOR-XR) 150 MG 24 hr capsule Take 1 capsule (150 mg total) by mouth daily with breakfast. For depression 10/23/13  Yes Sanjuana KavaAgnes I Nwoko, NP  busPIRone (BUSPAR) 10 MG tablet Take 1 tablet (10 mg total) by mouth 2 (two) times daily. For anxiety Patient not taking: Reported on 03/12/2014 10/23/13   Sanjuana KavaAgnes I Nwoko, NP  butalbital-acetaminophen-caffeine (FIORICET) [id number] MG per tablet Take 1 tablet by mouth every 6 (six) hours as needed for headache. Patient not taking: Reported on 03/02/2014 02/19/14   Joni ReiningNicole Pisciotta, PA-C  cyclobenzaprine (FLEXERIL) 10 MG tablet Take 1 tablet (10 mg total) by mouth 2 (two) times daily as needed for muscle spasms. 01/13/15   Danelle BerryLeisa Andruw Battie, PA-C  diazepam (VALIUM) 10 MG tablet Take 1 tablet (10 mg total) by mouth 3 (three) times daily. For anxiety Patient not taking: Reported on 04/09/2014 10/23/13   Sanjuana KavaAgnes I Nwoko, NP  HYDROcodone-acetaminophen (NORCO/VICODIN) 5-325 MG tablet Take 2 tablets by mouth every  4 (four) hours as needed. 01/13/15   Danelle BerryLeisa Cameren Odwyer, PA-C  lithium carbonate 300 MG capsule Take 1 capsule (300 mg total) by mouth 2 (two) times daily with a meal. For mood stabilization 10/23/13   Sanjuana KavaAgnes I Nwoko, NP  methocarbamol (ROBAXIN) 500 MG tablet Take 2 tablets (1,000 mg total) by mouth 4 (four) times daily as needed (Pain). Patient not taking: Reported on 03/02/2014 02/19/14   Joni ReiningNicole Pisciotta, PA-C  morphine (MS CONTIN) 15 MG 12 hr tablet Take 1 tablet (15 mg total) by mouth every 12 (twelve) hours. For severe pain Patient not taking: Reported on 12/25/2013 10/23/13   Sanjuana KavaAgnes I Nwoko, NP  oxyCODONE (OXY IR/ROXICODONE) 5 MG immediate release tablet Take 1 tablet (5 mg total) by mouth every 6 (six) hours as needed for severe pain or breakthrough pain. Patient not taking: Reported on 03/08/2014 10/23/13   Sanjuana KavaAgnes I Nwoko, NP  traZODone (DESYREL) 100 MG tablet Take 1 tablet (100 mg total) by mouth at bedtime as needed for sleep. For sleep Patient taking differently: Take 300 mg by mouth at bedtime as needed for sleep. For sleep 10/23/13   Sanjuana KavaAgnes I Nwoko, NP  BP 109/80 mmHg  Pulse 73  Temp(Src) 98.9 F (37.2 C) (Oral)  Resp 20  SpO2 96%  LMP 12/04/2010 Physical Exam  Constitutional: She is oriented to person, place, and time. She appears well-developed and well-nourished. No distress.  HENT:  Head: Normocephalic and atraumatic.  Nose: Nose normal.  Mouth/Throat: Oropharynx is clear and moist. No oropharyngeal exudate.  Eyes: Conjunctivae and EOM are normal. Pupils are equal, round, and reactive to light. Right eye exhibits no discharge. Left eye exhibits no discharge. No scleral icterus.  Neck: Normal range of motion. No JVD present. No tracheal deviation present. No thyromegaly present.  Cardiovascular: Normal rate, regular rhythm, normal heart sounds and intact distal pulses.  Exam reveals no gallop and no friction rub.   No murmur heard. Pulmonary/Chest: Effort normal and breath sounds normal. No  respiratory distress. She has no wheezes. She has no rales. She exhibits tenderness.  Abdominal: Soft. Bowel sounds are normal. She exhibits no distension and no mass. There is no tenderness. There is no rebound and no guarding.  Musculoskeletal: Normal range of motion. She exhibits no edema or tenderness.  Lymphadenopathy:    She has no cervical adenopathy.  Neurological: She is alert and oriented to person, place, and time. She has normal reflexes. She exhibits normal muscle tone. Coordination normal.  Skin: Skin is warm and dry. No rash noted. She is not diaphoretic. No erythema. No pallor.  Psychiatric: She has a normal mood and affect. Her behavior is normal. Judgment and thought content normal.  Nursing note and vitals reviewed.   ED Course  Procedures (including critical care time) Labs Review Labs Reviewed  BASIC METABOLIC PANEL - Abnormal; Notable for the following:    BUN <5 (*)    All other components within normal limits  CBC - Abnormal; Notable for the following:    WBC 11.8 (*)    MCV 100.2 (*)    All other components within normal limits  I-STAT TROPOININ, ED  Rosezena Sensor, ED    Imaging Review Dg Chest 2 View  01/13/2015  CLINICAL DATA:  49 year old female with mid to left-sided chest pain radiating into the left arm EXAM: CHEST  2 VIEW COMPARISON:  Prior chest x-ray 07/25/2009 FINDINGS: Cardiac and mediastinal contours remain within normal limits. Mild chronic bronchitic change in interstitial prominence is stable. No focal airspace consolidation, pulmonary edema, pleural effusion or pneumothorax. No acute osseous abnormality. IMPRESSION: No active cardiopulmonary disease. Electronically Signed   By: Malachy Moan M.D.   On: 01/13/2015 17:47   I have personally reviewed and evaluated these images and lab results as part of my medical decision-making.   EKG Interpretation   Date/Time:  Monday January 13 2015 16:07:03 EST Ventricular Rate:  81 PR  Interval:  161 QRS Duration: 80 QT Interval:  404 QTC Calculation: 469 R Axis:   65 Text Interpretation:  Sinus rhythm Nonspecific T abnormalities, lateral  leads no acute ischemic findings Confirmed by Rhunette Croft, MD, Janey Genta [phone])  on 01/13/2015 5:45:18 PM      MDM   Patient with chest pain, workup was initiated. Patient does not have any pulmonary component such as shortness of breath, cough.  Chest x-ray was negative Her labs are pertinent for a mild increase in white blood cells and elevated MCV. Troponins were drawn 2 and negative, 0.00. The patient continued to ask for narcotic pain medication, IV only. She was told that due to her blood pressure systolic 100, she was not going to be given  multiple medications and IV.  She refused aspirin, refused nitroglycerin.   She also declined oral pain medication. Patient repeat troponin was negative.  Feel she can safely discharge at this time, given her history of atypical chest pain, anxiety and how she reports that she has had similar pain before. She also reports multiple negative cardiac testing. Chest pain is likely muscle skeletal given recent lifting and today, it is reproducible with palpation.  Patient was discharged home with stable vital signs, ambulatory, and in satisfactory condition.  Filed Vitals:   01/13/15 1930 01/13/15 2000 01/13/15 2030 01/13/15 2100  BP: 113/82 115/74 112/70 109/80  Pulse: 77 80 75 73  Temp:      TempSrc:      Resp: SpO2: 96% 96% 96% 96%    Final diagnoses:  Chest pain, unspecified chest pain type        Danelle Berry, PA-C 01/16/15 0124  Derwood Kaplan, MD 01/16/15 1542

## 2015-01-13 NOTE — Discharge Instructions (Signed)

## 2015-01-13 NOTE — ED Notes (Signed)
Patient is alert and orientedx4.  Patient was explained discharge instructions and they understood them with no questions.  The patient's mother is taking the patient home. 

## 2015-02-14 ENCOUNTER — Emergency Department (HOSPITAL_COMMUNITY)
Admission: EM | Admit: 2015-02-14 | Discharge: 2015-02-14 | Disposition: A | Discharge status: Home / Self Care | Payer: Medicare Other | Attending: Emergency Medicine | Admitting: Emergency Medicine

## 2015-02-14 ENCOUNTER — Encounter (HOSPITAL_COMMUNITY): Payer: Self-pay | Admitting: Emergency Medicine

## 2015-02-14 DIAGNOSIS — F419 Anxiety disorder, unspecified: Secondary | ICD-10-CM | POA: Diagnosis not present

## 2015-02-14 DIAGNOSIS — F17210 Nicotine dependence, cigarettes, uncomplicated: Secondary | ICD-10-CM | POA: Diagnosis not present

## 2015-02-14 DIAGNOSIS — G43801 Other migraine, not intractable, with status migrainosus: Secondary | ICD-10-CM

## 2015-02-14 DIAGNOSIS — F4310 Post-traumatic stress disorder, unspecified: Secondary | ICD-10-CM | POA: Insufficient documentation

## 2015-02-14 DIAGNOSIS — Z79899 Other long term (current) drug therapy: Secondary | ICD-10-CM | POA: Diagnosis not present

## 2015-02-14 DIAGNOSIS — Z880 Allergy status to penicillin: Secondary | ICD-10-CM | POA: Diagnosis not present

## 2015-02-14 DIAGNOSIS — R197 Diarrhea, unspecified: Secondary | ICD-10-CM | POA: Diagnosis not present

## 2015-02-14 DIAGNOSIS — F329 Major depressive disorder, single episode, unspecified: Secondary | ICD-10-CM | POA: Insufficient documentation

## 2015-02-14 DIAGNOSIS — E039 Hypothyroidism, unspecified: Secondary | ICD-10-CM | POA: Diagnosis not present

## 2015-02-14 DIAGNOSIS — G43909 Migraine, unspecified, not intractable, without status migrainosus: Secondary | ICD-10-CM | POA: Diagnosis present

## 2015-02-14 DIAGNOSIS — F909 Attention-deficit hyperactivity disorder, unspecified type: Secondary | ICD-10-CM | POA: Diagnosis not present

## 2015-02-14 LAB — BASIC METABOLIC PANEL
ANION GAP: 10 (ref 5–15)
BUN: 6 mg/dL (ref 6–20)
CHLORIDE: 100 mmol/L — AB (ref 101–111)
CO2: 25 mmol/L (ref 22–32)
Calcium: 9.5 mg/dL (ref 8.9–10.3)
Creatinine, Ser: 0.73 mg/dL (ref 0.44–1.00)
GFR calc non Af Amer: >60 mL/min (ref >60)
Glucose, Bld: 83 mg/dL (ref 65–99)
POTASSIUM: 4.7 mmol/L (ref 3.5–5.1)
SODIUM: 135 mmol/L (ref 135–145)

## 2015-02-14 MED ORDER — BUTALBITAL-APAP-CAFFEINE 50-325-40 MG PO TABS
2.0000 | ORAL_TABLET | Freq: Once | ORAL | Status: AC | PRN
  Administered 2015-02-14: 2 via ORAL
  Filled 2015-02-14: qty 2

## 2015-02-14 MED ORDER — PROMETHAZINE HCL 25 MG/ML IJ SOLN
12.5000 mg | Freq: Once | INTRAMUSCULAR | Status: AC
  Administered 2015-02-14: 12.5 mg via INTRAVENOUS
  Filled 2015-02-14: qty 1

## 2015-02-14 MED ORDER — SODIUM CHLORIDE 0.9 % IV SOLN
Freq: Once | INTRAVENOUS | Status: AC
  Administered 2015-02-14: 11:00:00 via INTRAVENOUS

## 2015-02-14 MED ORDER — LORAZEPAM 2 MG/ML IJ SOLN
1.0000 mg | INTRAMUSCULAR | Status: DC | PRN
  Administered 2015-02-14: 1 mg via INTRAVENOUS
  Filled 2015-02-14: qty 1

## 2015-02-14 MED ORDER — VALPROATE SODIUM 500 MG/5ML IV SOLN
500.0000 mg | INTRAVENOUS | Status: AC
  Administered 2015-02-14: 500 mg via INTRAVENOUS
  Filled 2015-02-14: qty 5

## 2015-02-14 MED ORDER — KETOROLAC TROMETHAMINE 30 MG/ML IJ SOLN
30.0000 mg | Freq: Once | INTRAMUSCULAR | Status: AC
  Administered 2015-02-14: 30 mg via INTRAVENOUS
  Filled 2015-02-14: qty 1

## 2015-02-14 MED ORDER — SODIUM CHLORIDE 0.9 % IV BOLUS (SEPSIS)
1000.0000 mL | Freq: Once | INTRAVENOUS | Status: AC
  Administered 2015-02-14: 1000 mL via INTRAVENOUS

## 2015-02-14 NOTE — Discharge Instructions (Signed)
Your headache still may improve upon discharge. Home, rest, continue your regular medications. It is [facility], and ER physician group policy to not treat chronic pain, or migraine headaches with narcotic pain medicines and was we feel this is detrimental to your overall care. Follow-up with your [facility] care physician, and pain management providers.    Migraine Headache A migraine headache is an intense, throbbing pain on one or both sides of your head. A migraine can last for 30 minutes to several hours. CAUSES  The exact cause of a migraine headache is not always known. However, a migraine may be caused when nerves in the brain become irritated and release chemicals that cause inflammation. This causes pain. Certain things may also trigger migraines, such as:  Alcohol.  Smoking.  Stress.  Menstruation.  Aged cheeses.  Foods or drinks that contain nitrates, glutamate, aspartame, or tyramine.  Lack of sleep.  Chocolate.  Caffeine.  Hunger.  Physical exertion.  Fatigue.  Medicines used to treat chest pain (nitroglycerine), birth control pills, estrogen, and some blood pressure medicines. SIGNS AND SYMPTOMS  Pain on one or both sides of your head.  Pulsating or throbbing pain.  Severe pain that prevents daily activities.  Pain that is aggravated by any physical activity.  Nausea, vomiting, or both.  Dizziness.  Pain with exposure to bright lights, loud noises, or activity.  General sensitivity to bright lights, loud noises, or smells. Before you get a migraine, you may get warning signs that a migraine is coming (aura). An aura may include:  Seeing flashing lights.  Seeing bright spots, halos, or zigzag lines.  Having tunnel vision or blurred vision.  Having feelings of numbness or tingling.  Having trouble talking.  Having muscle weakness. DIAGNOSIS  A migraine headache is often diagnosed based on:  Symptoms.  Physical exam.  A CT scan or  MRI of your head. These imaging tests cannot diagnose migraines, but they can help rule out other causes of headaches. TREATMENT Medicines may be given for pain and nausea. Medicines can also be given to help prevent recurrent migraines.  HOME CARE INSTRUCTIONS  Only take over-the-counter or prescription medicines for pain or discomfort as directed by your health care provider. The use of long-term narcotics is not recommended.  Lie down in a dark, quiet room when you have a migraine.  Keep a journal to find out what may trigger your migraine headaches. For example, write down:  What you eat and drink.  How much sleep you get.  Any change to your diet or medicines.  Limit alcohol consumption.  Quit smoking if you smoke.  Get 7-9 hours of sleep, or as recommended by your health care provider.  Limit stress.  Keep lights dim if bright lights bother you and make your migraines worse. SEEK IMMEDIATE MEDICAL CARE IF:   Your migraine becomes severe.  You have a fever.  You have a stiff neck.  You have vision loss.  You have muscular weakness or loss of muscle control.  You start losing your balance or have trouble walking.  You feel faint or pass out.  You have severe symptoms that are different from your first symptoms. MAKE SURE YOU:   Understand these instructions.  Will watch your condition.  Will get help right away if you are not doing well or get worse.   This information is not intended to replace advice given to you by your health care provider. Make sure you discuss any questions you have  with your health care provider.   Document Released: 01/04/2005 Document Revised: 01/25/2014 Document Reviewed: 09/11/2012 Elsevier Interactive Patient Education [employer]

## 2015-02-14 NOTE — ED Provider Notes (Signed)
CSN: [id number]     Arrival date & time 02/14/15  1011 History   First MD Initiated Contact with Patient 02/14/15 1014     Chief Complaint  Patient presents with  . Migraine      HPI  Impression presents for evaluation of a headache. Has a long history of migraine headaches. Multiple ER visits over the last several years. Has a care treatment plan outlined for this. States she was seen by primary care physician on Wednesday and Thursday and had a blood draws most days. States she was told that her sodium was low and that she needed to come to the hospital. She was a copy by mother. Headache is slow in onset. Since yesterday. Throbbing and right-sided. Mild nausea and photophobia. No neck pain or stiffness. No fevers. No numbness weakness or focal neurological symptoms.  She was previously on upon, and lithium as well as MS Contin. She is no longer on those medications. She is taking Lamictal. States her migraine headaches have gotten much less severe and less frequency since being removed from her narcotic treatment.  Past Medical History  Diagnosis Date  . Migraine   . PTSD (post-traumatic stress disorder)   . Anxiety   . Personality disorder   . Depression   . High cholesterol   . Hypothyroid   . ADHD (attention deficit hyperactivity disorder)    Past Surgical History  Procedure Laterality Date  . Cholecystectomy    . Mandible fracture surgery     History reviewed. No pertinent family history. Social History  Substance Use Topics  . Smoking status: Current Every Day Smoker -- 1.00 packs/day    Types: Cigarettes  . Smokeless tobacco: None  . Alcohol Use: No   OB History    No data available     Review of Systems  Constitutional: Negative for fever, chills, diaphoresis, appetite change and fatigue.  HENT: Negative for mouth sores, sore throat and trouble swallowing.   Eyes: Negative for visual disturbance.  Respiratory: Negative for cough, chest tightness, shortness of  breath and wheezing.   Cardiovascular: Negative for chest pain.  Gastrointestinal: Positive for nausea and diarrhea. Negative for vomiting, abdominal pain and abdominal distention.  Endocrine: Negative for polydipsia, polyphagia and polyuria.  Genitourinary: Negative for dysuria, frequency and hematuria.  Musculoskeletal: Negative for gait problem.  Skin: Negative for color change, pallor and rash.  Neurological: Positive for headaches. Negative for dizziness, syncope and light-headedness.  Hematological: Does not bruise/bleed easily.  Psychiatric/Behavioral: Negative for behavioral problems and confusion.      Allergies  Compazine; Geodon; Imitrex; Metoclopramide hcl; Other; Penicillins; Wellbutrin; and Zofran  Home Medications   Prior to Admission medications   Medication Sig Start Date End Date Taking? Authorizing Provider  amphetamine-dextroamphetamine (ADDERALL) 10 MG tablet Take 1 tablet (10 mg total) by mouth 2 (two) times daily. For ADHD 10/23/13  Yes Sanjuana Kava, NP  clonazePAM (KLONOPIN) 1 MG tablet Take 1 mg by mouth 2 (two) times daily as needed for anxiety.   Yes Historical Provider, MD  lamoTRIgine (LAMICTAL) 25 MG tablet Take 50 mg by mouth daily.   Yes Historical Provider, MD  levothyroxine (SYNTHROID, LEVOTHROID) 175 MCG tablet Take 175 mcg by mouth daily.   Yes Historical Provider, MD  Oxcarbazepine (TRILEPTAL) 300 MG tablet Take 300 mg by mouth 3 (three) times daily.   Yes Historical Provider, MD  prazosin (MINIPRESS) 2 MG capsule Take 2 mg by mouth at bedtime. 12/17/14  Yes Historical Provider, MD  traZODone (DESYREL) 100 MG tablet Take 1 tablet (100 mg total) by mouth at bedtime as needed for sleep. For sleep Patient taking differently: Take 300 mg by mouth at bedtime as needed for sleep. For sleep 10/23/13  Yes Sanjuana Kava, NP  venlafaxine XR (EFFEXOR-XR) 75 MG 24 hr capsule Take 225 mg by mouth daily.   Yes Historical Provider, MD  busPIRone (BUSPAR) 10 MG  tablet Take 1 tablet (10 mg total) by mouth 2 (two) times daily. For anxiety Patient not taking: Reported on 03/12/2014 10/23/13   Sanjuana Kava, NP  butalbital-acetaminophen-caffeine (FIORICET) [phone] MG per tablet Take 1 tablet by mouth every 6 (six) hours as needed for headache. Patient not taking: Reported on 03/02/2014 02/19/14   Joni Reining Pisciotta, PA-C  cyclobenzaprine (FLEXERIL) 10 MG tablet Take 1 tablet (10 mg total) by mouth 2 (two) times daily as needed for muscle spasms. Patient not taking: Reported on 02/14/2015 01/13/15   Danelle Berry, PA-C  diazepam (VALIUM) 10 MG tablet Take 1 tablet (10 mg total) by mouth 3 (three) times daily. For anxiety Patient not taking: Reported on 04/09/2014 10/23/13   Sanjuana Kava, NP  HYDROcodone-acetaminophen (NORCO/VICODIN) 5-325 MG tablet Take 2 tablets by mouth every 4 (four) hours as needed. Patient not taking: Reported on 02/14/2015 01/13/15   Danelle Berry, PA-C  levothyroxine (SYNTHROID, LEVOTHROID) 125 MCG tablet Take 1 tablet (125 mcg total) by mouth daily before breakfast. For low thyroid function Patient not taking: Reported on 02/14/2015 10/23/13   Sanjuana Kava, NP  lithium carbonate 300 MG capsule Take 1 capsule (300 mg total) by mouth 2 (two) times daily with a meal. For mood stabilization Patient not taking: Reported on 02/14/2015 10/23/13   Sanjuana Kava, NP  methocarbamol (ROBAXIN) 500 MG tablet Take 2 tablets (1,000 mg total) by mouth 4 (four) times daily as needed (Pain). Patient not taking: Reported on 03/02/2014 02/19/14   Joni Reining Pisciotta, PA-C  morphine (MS CONTIN) 15 MG 12 hr tablet Take 1 tablet (15 mg total) by mouth every 12 (twelve) hours. For severe pain Patient not taking: Reported on 12/25/2013 10/23/13   Sanjuana Kava, NP  oxyCODONE (OXY IR/ROXICODONE) 5 MG immediate release tablet Take 1 tablet (5 mg total) by mouth every 6 (six) hours as needed for severe pain or breakthrough pain. Patient not taking: Reported on 03/08/2014 10/23/13    Sanjuana Kava, NP  venlafaxine XR (EFFEXOR-XR) 150 MG 24 hr capsule Take 1 capsule (150 mg total) by mouth daily with breakfast. For depression Patient not taking: Reported on 02/14/2015 10/23/13   Sanjuana Kava, NP   BP 118/70 mmHg  Pulse 77  Temp(Src) 98.7 F (37.1 C) (Oral)  Resp 14  Ht  (1.6 m)  Wt 140 lb (63.504 kg)  BMI 24.81 kg/m2  SpO2 97%  LMP 12/04/2010 Physical Exam  Constitutional: She is oriented to person, place, and time. She appears well-developed and well-nourished. No distress.  HENT:  Head: Normocephalic.  Eyes: Conjunctivae are normal. Pupils are equal, round, and reactive to light. No scleral icterus.  Neck: Normal range of motion. Neck supple. No thyromegaly present.  Cardiovascular: Normal rate and regular rhythm.  Exam reveals no gallop and no friction rub.   No murmur heard. Pulmonary/Chest: Effort normal and breath sounds normal. No respiratory distress. She has no wheezes. She has no rales.  Abdominal: Soft. Bowel sounds are normal. She exhibits no distension. There is no tenderness. There is no rebound.  Musculoskeletal: Normal  range of motion.  Neurological: She is alert and oriented to person, place, and time. She has normal strength. No cranial nerve deficit or sensory deficit. She displays a negative Romberg sign. GCS eye subscore is 4. GCS verbal subscore is 5. GCS motor subscore is 6.  Skin: Skin is warm and dry. No rash noted.  Psychiatric: She has a normal mood and affect. Her behavior is normal.    ED Course  Procedures (including critical care time) Labs Review Labs Reviewed  BASIC METABOLIC PANEL - Abnormal; Notable for the following:    Chloride 100 (*)    All other components within normal limits    Imaging Review No results found. I have personally reviewed and evaluated these images and lab results as part of my medical decision-making.   EKG Interpretation None      MDM   Final diagnoses:  Other migraine with status  migrainosus, not intractable   Patient states minimal relief after the medications. Had multiple prolonged and detailed discussions with her. She freely admits that her headache pattern got better" after they got me off of Opana, and MS Contin".  Her sodium is normal here. She is stated that her primary care physician found to be 123. She is rehydrated. She's been back in 4 to the bathroom. She states "I'm not happy" when I ask her about being discharged. I discussed with her that her limitation of her allergies or medicines that she "doesn't tolerate" does limit some of her treatment here. Was given oral, Phenergan, and Depakote. Told on several occasions and limit use narcotics for her treatment. I feel this is very likely a migraine headache, versus chronic pain. Did offer her Fioricet in the ER for her migraine which she declined.    Rolland Porter, MD 02/14/15 1336

## 2015-02-14 NOTE — ED Notes (Signed)
Pt comes from home with c/o migraine since yesterday. Pt rates 10/10. Pt reports being at PCP office and was told to come in with low sodium levels. Pt accompanied by mother.

## 2015-02-14 NOTE — ED Notes (Signed)
Pt ambulates independently and with steady gait at time of discharge. Discharge instructions and follow up information reviewed with patient. No other questions or concerns voiced at this time.  

## 2015-04-17 ENCOUNTER — Encounter (HOSPITAL_COMMUNITY): Payer: Self-pay | Admitting: *Deleted

## 2015-04-17 ENCOUNTER — Emergency Department (HOSPITAL_COMMUNITY)
Admission: EM | Admit: 2015-04-17 | Discharge: 2015-04-17 | Disposition: A | Discharge status: Home / Self Care | Payer: Medicare Other | Attending: Emergency Medicine | Admitting: Emergency Medicine

## 2015-04-17 DIAGNOSIS — R51 Headache: Secondary | ICD-10-CM | POA: Diagnosis present

## 2015-04-17 DIAGNOSIS — Z8639 Personal history of other endocrine, nutritional and metabolic disease: Secondary | ICD-10-CM | POA: Diagnosis not present

## 2015-04-17 DIAGNOSIS — F4310 Post-traumatic stress disorder, unspecified: Secondary | ICD-10-CM | POA: Insufficient documentation

## 2015-04-17 DIAGNOSIS — F419 Anxiety disorder, unspecified: Secondary | ICD-10-CM | POA: Insufficient documentation

## 2015-04-17 DIAGNOSIS — G43809 Other migraine, not intractable, without status migrainosus: Secondary | ICD-10-CM | POA: Diagnosis not present

## 2015-04-17 DIAGNOSIS — F329 Major depressive disorder, single episode, unspecified: Secondary | ICD-10-CM | POA: Insufficient documentation

## 2015-04-17 DIAGNOSIS — F17210 Nicotine dependence, cigarettes, uncomplicated: Secondary | ICD-10-CM | POA: Diagnosis not present

## 2015-04-17 DIAGNOSIS — F909 Attention-deficit hyperactivity disorder, unspecified type: Secondary | ICD-10-CM | POA: Diagnosis not present

## 2015-04-17 DIAGNOSIS — Z79899 Other long term (current) drug therapy: Secondary | ICD-10-CM | POA: Insufficient documentation

## 2015-04-17 DIAGNOSIS — Z880 Allergy status to penicillin: Secondary | ICD-10-CM | POA: Insufficient documentation

## 2015-04-17 MED ORDER — DIPHENHYDRAMINE HCL 50 MG/ML IJ SOLN
25.0000 mg | Freq: Once | INTRAMUSCULAR | Status: AC
Start: 2015-04-17 — End: 2015-04-17
  Administered 2015-04-17: 25 mg via INTRAVENOUS
  Filled 2015-04-17: qty 1

## 2015-04-17 MED ORDER — LORAZEPAM 2 MG/ML IJ SOLN
1.0000 mg | Freq: Once | INTRAMUSCULAR | Status: AC
  Administered 2015-04-17: 1 mg via INTRAVENOUS
  Filled 2015-04-17: qty 1

## 2015-04-17 MED ORDER — DIPHENHYDRAMINE HCL 50 MG/ML IJ SOLN
25.0000 mg | Freq: Once | INTRAMUSCULAR | Status: AC
  Administered 2015-04-17: 25 mg via INTRAVENOUS
  Filled 2015-04-17: qty 1

## 2015-04-17 MED ORDER — KETOROLAC TROMETHAMINE 30 MG/ML IJ SOLN
30.0000 mg | Freq: Once | INTRAMUSCULAR | Status: AC
  Administered 2015-04-17: 30 mg via INTRAVENOUS
  Filled 2015-04-17: qty 1

## 2015-04-17 NOTE — ED Notes (Signed)
Attempted IV 2x. Will have another RN attempt.  

## 2015-04-17 NOTE — ED Notes (Signed)
Patient here with complaint of migraine headache and nausea, reports hx of same and also thinks she is having anxiety attack

## 2015-04-17 NOTE — Discharge Instructions (Signed)

## 2015-04-17 NOTE — ED Provider Notes (Signed)
CSN: [id number]     Arrival date & time 04/17/15  1515 History   First MD Initiated Contact with Patient 04/17/15 1733     Chief Complaint  Patient presents with  . Headache     (Consider location/radiation/quality/duration/timing/severity/associated sxs/prior Treatment) HPI Comments: Patient with a history of Migraine Headaches and Anxiety presents today with a chief complaint of headache.  She reports gradual onset of headache this morning and states that the headache has gradually worsened since that time.  She reports taking Advil for the pain, but does not feel that it helped.  She states that headache is similar to previous migraines.  No recent head injury or trauma.  She reports associated photophobia, phonophobia, and nausea.  She denies any vision changes, focal weakness, numbness, tingling, facial asymmetry, fever, chills, neck pain or stiffness.  Patient is a 51 y.o. female presenting with headaches.  Headache   Past Medical History  Diagnosis Date  . Migraine   . PTSD (post-traumatic stress disorder)   . Anxiety   . Personality disorder   . Depression   . High cholesterol   . Hypothyroid   . ADHD (attention deficit hyperactivity disorder)    Past Surgical History  Procedure Laterality Date  . Cholecystectomy    . Mandible fracture surgery     No family history on file. Social History  Substance Use Topics  . Smoking status: Current Every Day Smoker -- 1.00 packs/day    Types: Cigarettes  . Smokeless tobacco: None  . Alcohol Use: No   OB History    No data available     Review of Systems  Neurological: Positive for headaches.  All other systems reviewed and are negative.     Allergies  Compazine; Decadron; Geodon; Imitrex; Metoclopramide hcl; Other; Penicillins; Wellbutrin; and Zofran  Home Medications   Prior to Admission medications   Medication Sig Start Date End Date Taking? Authorizing Provider  amphetamine-dextroamphetamine (ADDERALL XR) 10  MG 24 hr capsule Take 20 mg by mouth.   Yes Historical Provider, MD  amphetamine-dextroamphetamine (ADDERALL) 10 MG tablet Take 1 tablet (10 mg total) by mouth 2 (two) times daily. For ADHD 10/23/13  Yes Sanjuana Kava, NP  clonazePAM (KLONOPIN) 1 MG tablet Take 1 mg by mouth 2 (two) times daily as needed for anxiety.   Yes Historical Provider, MD  lamoTRIgine (LAMICTAL) 25 MG tablet Take 150 mg by mouth daily.    Yes Historical Provider, MD  prazosin (MINIPRESS) 2 MG capsule Take 2 mg by mouth at bedtime. 12/17/14  Yes Historical Provider, MD  traZODone (DESYREL) 100 MG tablet Take 1 tablet (100 mg total) by mouth at bedtime as needed for sleep. For sleep Patient taking differently: Take 300 mg by mouth at bedtime as needed for sleep. For sleep 10/23/13  Yes Sanjuana Kava, NP  venlafaxine XR (EFFEXOR-XR) 150 MG 24 hr capsule Take 1 capsule (150 mg total) by mouth daily with breakfast. For depression Patient taking differently: Take 150 mg by mouth 2 (two) times daily. For depression 10/23/13  Yes Sanjuana Kava, NP  busPIRone (BUSPAR) 10 MG tablet Take 1 tablet (10 mg total) by mouth 2 (two) times daily. For anxiety Patient not taking: Reported on 03/12/2014 10/23/13   Sanjuana Kava, NP  butalbital-acetaminophen-caffeine (FIORICET) [phone] MG per tablet Take 1 tablet by mouth every 6 (six) hours as needed for headache. Patient not taking: Reported on 03/02/2014 02/19/14   Joni Reining Pisciotta, PA-C  cyclobenzaprine (FLEXERIL) 10 MG tablet  Take 1 tablet (10 mg total) by mouth 2 (two) times daily as needed for muscle spasms. Patient not taking: Reported on 02/14/2015 01/13/15   Danelle BerryLeisa Tapia, PA-C  diazepam (VALIUM) 10 MG tablet Take 1 tablet (10 mg total) by mouth 3 (three) times daily. For anxiety Patient not taking: Reported on 04/09/2014 10/23/13   Sanjuana KavaAgnes I Nwoko, NP  HYDROcodone-acetaminophen (NORCO/VICODIN) 5-325 MG tablet Take 2 tablets by mouth every 4 (four) hours as needed. Patient not taking: Reported on  02/14/2015 01/13/15   Danelle BerryLeisa Tapia, PA-C  levothyroxine (SYNTHROID, LEVOTHROID) 125 MCG tablet Take 1 tablet (125 mcg total) by mouth daily before breakfast. For low thyroid function Patient not taking: Reported on 02/14/2015 10/23/13   Sanjuana KavaAgnes I Nwoko, NP  lithium carbonate 300 MG capsule Take 1 capsule (300 mg total) by mouth 2 (two) times daily with a meal. For mood stabilization Patient not taking: Reported on 02/14/2015 10/23/13   Sanjuana KavaAgnes I Nwoko, NP  methocarbamol (ROBAXIN) 500 MG tablet Take 2 tablets (1,000 mg total) by mouth 4 (four) times daily as needed (Pain). Patient not taking: Reported on 03/02/2014 02/19/14   Joni ReiningNicole Pisciotta, PA-C  morphine (MS CONTIN) 15 MG 12 hr tablet Take 1 tablet (15 mg total) by mouth every 12 (twelve) hours. For severe pain Patient not taking: Reported on 12/25/2013 10/23/13   Sanjuana KavaAgnes I Nwoko, NP  oxyCODONE (OXY IR/ROXICODONE) 5 MG immediate release tablet Take 1 tablet (5 mg total) by mouth every 6 (six) hours as needed for severe pain or breakthrough pain. Patient not taking: Reported on 03/08/2014 10/23/13   Sanjuana KavaAgnes I Nwoko, NP   BP 105/79 mmHg  Pulse 64  Temp(Src) 98.9 F (37.2 C) (Oral)  Resp 20  SpO2 100%  LMP 12/04/2010 Physical Exam  Constitutional: She is oriented to person, place, and time. She appears well-developed and well-nourished.  HENT:  Head: Normocephalic and atraumatic.  Mouth/Throat: Oropharynx is clear and moist.  Eyes: EOM are normal. Pupils are equal, round, and reactive to light.  Neck: Normal range of motion. Neck supple.  Cardiovascular: Normal rate, regular rhythm and normal heart sounds.   Pulmonary/Chest: Effort normal and breath sounds normal.  Musculoskeletal: Normal range of motion.  Neurological: She is alert and oriented to person, place, and time. She has normal strength. No cranial nerve deficit or sensory deficit. Coordination and gait normal.  Skin: Skin is warm and dry.  Psychiatric: She has a normal mood and affect.  Nursing  note and vitals reviewed.   ED Course  Procedures (including critical care time) Labs Review Labs Reviewed - No data to display  Imaging Review No results found. I have personally reviewed and evaluated these images and lab results as part of my medical decision-making.   EKG Interpretation None      MDM   Final diagnoses:  None   Patient with a history of migraines presents today with a headache onset this morning.  She reports that her headache is similar to headaches she has had in the past.  Normal neurological exam.  She is afebrile with no nuchal rigidity to suggest meningitis.  Pt HA treated and improved mildly while in ED.  Patient requesting IV narcotic medications.  Patient informed of her care plan and told that she would not be getting narcotics.  Presentation is like pts typical HA and non concerning for [facility], ICH, Meningitis, or temporal arteritis. Pt is afebrile with no focal neuro deficits, nuchal rigidity, or change in vision. Pt is to follow  up with PCP to discuss prophylactic medication. Pt verbalizes understanding and is agreeable with plan to dc.  Patient stable for discharge.  Return precautions given.       Santiago Glad, PA-C 04/17/15 2239  Laurence Spates, MD 04/18/15 [phone]

## 2015-04-20 ENCOUNTER — Encounter (HOSPITAL_COMMUNITY): Payer: Self-pay

## 2015-04-20 ENCOUNTER — Emergency Department (HOSPITAL_COMMUNITY)
Admission: EM | Admit: 2015-04-20 | Discharge: 2015-04-20 | Disposition: A | Discharge status: Home / Self Care | Payer: Medicare Other | Attending: Emergency Medicine | Admitting: Emergency Medicine

## 2015-04-20 DIAGNOSIS — R110 Nausea: Secondary | ICD-10-CM | POA: Diagnosis not present

## 2015-04-20 DIAGNOSIS — R51 Headache: Secondary | ICD-10-CM | POA: Diagnosis not present

## 2015-04-20 DIAGNOSIS — F17210 Nicotine dependence, cigarettes, uncomplicated: Secondary | ICD-10-CM | POA: Diagnosis not present

## 2015-04-20 DIAGNOSIS — M542 Cervicalgia: Secondary | ICD-10-CM | POA: Insufficient documentation

## 2015-04-20 NOTE — ED Notes (Signed)
Pt left. Slammed stickers, bp cuff, and vomit bag on table and yelled "im not waiting this long for my migraine"  And walked out.

## 2015-04-20 NOTE — ED Notes (Signed)
Pt here with c/o headache which is causing nausea and neck pain and woke up with two days ago. She reports she was here two nights ago for same. No neuro deficits. Pt A&OX4.

## 2015-04-21 ENCOUNTER — Emergency Department (HOSPITAL_COMMUNITY)
Admission: EM | Admit: 2015-04-21 | Discharge: 2015-04-21 | Disposition: A | Discharge status: Home / Self Care | Payer: Medicare Other | Attending: Emergency Medicine | Admitting: Emergency Medicine

## 2015-04-21 ENCOUNTER — Encounter (HOSPITAL_COMMUNITY): Payer: Self-pay | Admitting: *Deleted

## 2015-04-21 DIAGNOSIS — F329 Major depressive disorder, single episode, unspecified: Secondary | ICD-10-CM | POA: Diagnosis not present

## 2015-04-21 DIAGNOSIS — G43809 Other migraine, not intractable, without status migrainosus: Secondary | ICD-10-CM | POA: Diagnosis not present

## 2015-04-21 DIAGNOSIS — F419 Anxiety disorder, unspecified: Secondary | ICD-10-CM | POA: Diagnosis not present

## 2015-04-21 DIAGNOSIS — R51 Headache: Secondary | ICD-10-CM | POA: Diagnosis present

## 2015-04-21 DIAGNOSIS — Z79899 Other long term (current) drug therapy: Secondary | ICD-10-CM | POA: Insufficient documentation

## 2015-04-21 DIAGNOSIS — Z880 Allergy status to penicillin: Secondary | ICD-10-CM | POA: Insufficient documentation

## 2015-04-21 DIAGNOSIS — Z8639 Personal history of other endocrine, nutritional and metabolic disease: Secondary | ICD-10-CM | POA: Diagnosis not present

## 2015-04-21 DIAGNOSIS — F17210 Nicotine dependence, cigarettes, uncomplicated: Secondary | ICD-10-CM | POA: Diagnosis not present

## 2015-04-21 DIAGNOSIS — F909 Attention-deficit hyperactivity disorder, unspecified type: Secondary | ICD-10-CM | POA: Insufficient documentation

## 2015-04-21 MED ORDER — SODIUM CHLORIDE 0.9 % IV BOLUS (SEPSIS): 1000.0000 mL | Freq: Once | INTRAVENOUS | Status: DC

## 2015-04-21 MED ORDER — PROMETHAZINE HCL 25 MG PO TABS
25.0000 mg | ORAL_TABLET | Freq: Once | ORAL | Status: AC
  Administered 2015-04-21: 25 mg via ORAL
  Filled 2015-04-21: qty 1

## 2015-04-21 MED ORDER — KETOROLAC TROMETHAMINE 30 MG/ML IJ SOLN
30.0000 mg | Freq: Once | INTRAMUSCULAR | Status: AC
  Administered 2015-04-21: 30 mg via INTRAMUSCULAR
  Filled 2015-04-21: qty 1

## 2015-04-21 NOTE — ED Notes (Signed)
Attempted IV stick x 2.  Dr. Criss AlvineGoldston made aware.

## 2015-04-21 NOTE — ED Provider Notes (Signed)
CSN: [id number]     Arrival date & time 04/21/15  7846 History   First MD Initiated Contact with Patient 04/21/15 0940     Chief Complaint  Patient presents with  . Migraine     (Consider location/radiation/quality/duration/timing/severity/associated sxs/prior Treatment) HPI  50 year old female presents with a recurrent migraine. Has been having a migraine headache for the last 3 days. Patient states that this feels like typical migraines. Has not taken anything for it. Was here last night but left due to the weight. 3 days ago she was given Toradol, Ativan, and Benadryl and states she felt much better temporarily. Denies any fevers. Pain raise injury neck but this is typical. No stiffness. No weakness or numbness.  Past Medical History  Diagnosis Date  . Migraine   . PTSD (post-traumatic stress disorder)   . Anxiety   . Personality disorder   . Depression   . High cholesterol   . Hypothyroid   . ADHD (attention deficit hyperactivity disorder)    Past Surgical History  Procedure Laterality Date  . Cholecystectomy    . Mandible fracture surgery     History reviewed. No pertinent family history. Social History  Substance Use Topics  . Smoking status: Current Every Day Smoker -- 1.00 packs/day    Types: Cigarettes  . Smokeless tobacco: None  . Alcohol Use: No   OB History    No data available     Review of Systems  Constitutional: Negative for fever.  Eyes: Positive for photophobia.  Gastrointestinal: Positive for vomiting. Negative for nausea.  Neurological: Positive for headaches. Negative for weakness and numbness.  All other systems reviewed and are negative.     Allergies  Compazine; Decadron; Geodon; Imitrex; Metoclopramide hcl; Other; Penicillins; Wellbutrin; and Zofran  Home Medications   Prior to Admission medications   Medication Sig Start Date End Date Taking? Authorizing Provider  amphetamine-dextroamphetamine (ADDERALL XR) 10 MG 24 hr capsule Take 20  mg by mouth.    Historical Provider, MD  amphetamine-dextroamphetamine (ADDERALL) 10 MG tablet Take 1 tablet (10 mg total) by mouth 2 (two) times daily. For ADHD 10/23/13   Sanjuana Kava, NP  busPIRone (BUSPAR) 10 MG tablet Take 1 tablet (10 mg total) by mouth 2 (two) times daily. For anxiety Patient not taking: Reported on 03/12/2014 10/23/13   Sanjuana Kava, NP  butalbital-acetaminophen-caffeine (FIORICET) [phone] MG per tablet Take 1 tablet by mouth every 6 (six) hours as needed for headache. Patient not taking: Reported on 03/02/2014 02/19/14   Joni Reining Pisciotta, PA-C  clonazePAM (KLONOPIN) 1 MG tablet Take 1 mg by mouth 2 (two) times daily as needed for anxiety.    Historical Provider, MD  cyclobenzaprine (FLEXERIL) 10 MG tablet Take 1 tablet (10 mg total) by mouth 2 (two) times daily as needed for muscle spasms. Patient not taking: Reported on 02/14/2015 01/13/15   Danelle Berry, PA-C  diazepam (VALIUM) 10 MG tablet Take 1 tablet (10 mg total) by mouth 3 (three) times daily. For anxiety Patient not taking: Reported on 04/09/2014 10/23/13   Sanjuana Kava, NP  HYDROcodone-acetaminophen (NORCO/VICODIN) 5-325 MG tablet Take 2 tablets by mouth every 4 (four) hours as needed. Patient not taking: Reported on 02/14/2015 01/13/15   Danelle Berry, PA-C  lamoTRIgine (LAMICTAL) 25 MG tablet Take 150 mg by mouth daily.     Historical Provider, MD  levothyroxine (SYNTHROID, LEVOTHROID) 125 MCG tablet Take 1 tablet (125 mcg total) by mouth daily before breakfast. For low thyroid function Patient not  taking: Reported on 02/14/2015 10/23/13   Sanjuana Kava, NP  lithium carbonate 300 MG capsule Take 1 capsule (300 mg total) by mouth 2 (two) times daily with a meal. For mood stabilization Patient not taking: Reported on 02/14/2015 10/23/13   Sanjuana Kava, NP  methocarbamol (ROBAXIN) 500 MG tablet Take 2 tablets (1,000 mg total) by mouth 4 (four) times daily as needed (Pain). Patient not taking: Reported on 03/02/2014 02/19/14    Joni Reining Pisciotta, PA-C  morphine (MS CONTIN) 15 MG 12 hr tablet Take 1 tablet (15 mg total) by mouth every 12 (twelve) hours. For severe pain Patient not taking: Reported on 12/25/2013 10/23/13   Sanjuana Kava, NP  oxyCODONE (OXY IR/ROXICODONE) 5 MG immediate release tablet Take 1 tablet (5 mg total) by mouth every 6 (six) hours as needed for severe pain or breakthrough pain. Patient not taking: Reported on 03/08/2014 10/23/13   Sanjuana Kava, NP  prazosin (MINIPRESS) 2 MG capsule Take 2 mg by mouth at bedtime. 12/17/14   Historical Provider, MD  traZODone (DESYREL) 100 MG tablet Take 1 tablet (100 mg total) by mouth at bedtime as needed for sleep. For sleep Patient taking differently: Take 300 mg by mouth at bedtime as needed for sleep. For sleep 10/23/13   Sanjuana Kava, NP  venlafaxine XR (EFFEXOR-XR) 150 MG 24 hr capsule Take 1 capsule (150 mg total) by mouth daily with breakfast. For depression Patient taking differently: Take 150 mg by mouth 2 (two) times daily. For depression 10/23/13   Sanjuana Kava, NP   BP 100/69 mmHg  Pulse 79  Temp(Src) 98.6 F (37 C) (Oral)  Resp 18  SpO2 100%  LMP 12/04/2010 Physical Exam  Constitutional: She is oriented to person, place, and time. She appears well-developed and well-nourished.  HENT:  Head: Normocephalic and atraumatic.  Right Ear: External ear normal.  Left Ear: External ear normal.  Nose: Nose normal.  Eyes: EOM are normal. Pupils are equal, round, and reactive to light. Right eye exhibits no discharge. Left eye exhibits no discharge.  Neck: Normal range of motion. Neck supple.  No stiffness/meningismus  Cardiovascular: Normal rate, regular rhythm and normal heart sounds.   Pulmonary/Chest: Effort normal and breath sounds normal.  Abdominal: Soft. There is no tenderness.  Neurological: She is alert and oriented to person, place, and time.  CN 2-12 grossly intact. 5/5 strength in all 4 extremities. Grossly normal sensation. Normal finger to  nose  Skin: Skin is warm and dry.  Nursing note and vitals reviewed.   ED Course  Procedures (including critical care time) Labs Review Labs Reviewed - No data to display  Imaging Review No results found. I have personally reviewed and evaluated these images and lab results as part of my medical decision-making.   EKG Interpretation None      MDM   Final diagnoses:  Other migraine without status migrainosus, not intractable    Patient with recurrent migraine. Care plan reviewed. Neuro exam normal. She has multiple allergies to typical HA medicines. Patient not requesting narcotics but is requesting benadryl and ativan IV to help with headache and her anxiety. I discussed these are not indicated. I have given her IM toradol. Briefly her BP was in 90s though she was not symptomatic. Due to this I offered her IV fluids but nurse unable to get IV. Now her BP is normal. Patient does not want IV anymore and just wants to go home. I highly doubt a medical emergency  such as SAH, meningitis/encephalitis, epidural/subdural or other acute CNS emergency.    Pricilla LovelessScott Adryen Cookson, MD 04/22/15 [phone]

## 2015-04-21 NOTE — ED Notes (Signed)
Patient alert and oriented at discharge.  Patient taken to the wheelchair to the front.  Vital signs stable.

## 2015-04-21 NOTE — ED Notes (Signed)
Pt reports having migraine x 3 days with n/v.

## 2015-06-09 ENCOUNTER — Emergency Department (HOSPITAL_COMMUNITY)
Admission: EM | Admit: 2015-06-09 | Discharge: 2015-06-09 | Disposition: A | Discharge status: Home / Self Care | Payer: Medicare Other | Attending: Emergency Medicine | Admitting: Emergency Medicine

## 2015-06-09 ENCOUNTER — Encounter (HOSPITAL_COMMUNITY): Payer: Self-pay | Admitting: Emergency Medicine

## 2015-06-09 DIAGNOSIS — F419 Anxiety disorder, unspecified: Secondary | ICD-10-CM | POA: Diagnosis not present

## 2015-06-09 DIAGNOSIS — Z880 Allergy status to penicillin: Secondary | ICD-10-CM | POA: Diagnosis not present

## 2015-06-09 DIAGNOSIS — F4310 Post-traumatic stress disorder, unspecified: Secondary | ICD-10-CM | POA: Diagnosis not present

## 2015-06-09 DIAGNOSIS — Z8639 Personal history of other endocrine, nutritional and metabolic disease: Secondary | ICD-10-CM | POA: Insufficient documentation

## 2015-06-09 DIAGNOSIS — F329 Major depressive disorder, single episode, unspecified: Secondary | ICD-10-CM | POA: Diagnosis not present

## 2015-06-09 DIAGNOSIS — F909 Attention-deficit hyperactivity disorder, unspecified type: Secondary | ICD-10-CM | POA: Insufficient documentation

## 2015-06-09 DIAGNOSIS — Z79899 Other long term (current) drug therapy: Secondary | ICD-10-CM | POA: Insufficient documentation

## 2015-06-09 DIAGNOSIS — F17210 Nicotine dependence, cigarettes, uncomplicated: Secondary | ICD-10-CM | POA: Diagnosis not present

## 2015-06-09 DIAGNOSIS — G43709 Chronic migraine without aura, not intractable, without status migrainosus: Secondary | ICD-10-CM | POA: Diagnosis not present

## 2015-06-09 DIAGNOSIS — IMO0002 Reserved for concepts with insufficient information to code with codable children: Secondary | ICD-10-CM

## 2015-06-09 DIAGNOSIS — R51 Headache: Secondary | ICD-10-CM | POA: Diagnosis present

## 2015-06-09 LAB — I-STAT CHEM 8, ED
BUN: 9 mg/dL (ref 6–20)
CALCIUM ION: 1.15 mmol/L (ref 1.12–1.23)
Chloride: 101 mmol/L (ref 101–111)
Creatinine, Ser: 1.3 mg/dL — ABNORMAL HIGH (ref 0.44–1.00)
GLUCOSE: 78 mg/dL (ref 65–99)
HCT: 41 % (ref 36.0–46.0)
HEMOGLOBIN: 13.9 g/dL (ref 12.0–15.0)
Potassium: 4.1 mmol/L (ref 3.5–5.1)
Sodium: 136 mmol/L (ref 135–145)
TCO2: 25 mmol/L (ref 0–100)

## 2015-06-09 MED ORDER — LORAZEPAM 1 MG PO TABS
1.0000 mg | ORAL_TABLET | Freq: Once | ORAL | Status: AC
  Administered 2015-06-09: 1 mg via ORAL
  Filled 2015-06-09: qty 1

## 2015-06-09 MED ORDER — KETOROLAC TROMETHAMINE 30 MG/ML IJ SOLN
30.0000 mg | Freq: Once | INTRAMUSCULAR | Status: AC
  Administered 2015-06-09: 30 mg via INTRAVENOUS
  Filled 2015-06-09: qty 1

## 2015-06-09 MED ORDER — PROMETHAZINE HCL 25 MG PO TABS
25.0000 mg | ORAL_TABLET | Freq: Once | ORAL | Status: AC
  Administered 2015-06-09: 25 mg via ORAL
  Filled 2015-06-09: qty 1

## 2015-06-09 MED ORDER — DIPHENHYDRAMINE HCL 25 MG PO CAPS
25.0000 mg | ORAL_CAPSULE | Freq: Once | ORAL | Status: AC
  Administered 2015-06-09: 25 mg via ORAL
  Filled 2015-06-09: qty 1

## 2015-06-09 MED ORDER — SODIUM CHLORIDE 0.9 % IV BOLUS (SEPSIS)
1000.0000 mL | Freq: Once | INTRAVENOUS | Status: AC
  Administered 2015-06-09: 1000 mL via INTRAVENOUS

## 2015-06-09 NOTE — ED Notes (Signed)
Pt reports migraine starting at 300 this morning. Pt alert x4. NAD at this time.

## 2015-06-09 NOTE — ED Provider Notes (Signed)
CSN: [id number]     Arrival date & time 06/09/15  0831 History   First MD Initiated Contact with Patient 06/09/15 [phone]     Chief Complaint  Patient presents with  . Migraine     (Consider location/radiation/quality/duration/timing/severity/associated sxs/prior Treatment) HPI  50 year old female with a history of migraines presents with a recurrent migraine. Patient states the headache started at 3 AM. It is in her posterior head and neck. Has had some nausea as well. Vomited once early in the morning. No fevers. No weakness or numbness. She states this headache is very similar to multiple prior headaches. Patient tells me she is in the process of getting a new primary care doctor but has not seen them yet. She is not on anything for chronic headaches. Patient has not taken anything such as Aleve or ibuprofen. She is allergic to Tylenol (nausea/doesn't work)  Past Medical History  Diagnosis Date  . Migraine   . PTSD (post-traumatic stress disorder)   . Anxiety   . Personality disorder   . Depression   . High cholesterol   . Hypothyroid   . ADHD (attention deficit hyperactivity disorder)    Past Surgical History  Procedure Laterality Date  . Cholecystectomy    . Mandible fracture surgery     No family history on file. Social History  Substance Use Topics  . Smoking status: Current Every Day Smoker -- 1.00 packs/day    Types: Cigarettes  . Smokeless tobacco: None  . Alcohol Use: No   OB History    No data available     Review of Systems  Constitutional: Negative for fever.  Eyes: Negative for visual disturbance.  Gastrointestinal: Positive for nausea.  Musculoskeletal: Positive for neck pain. Negative for neck stiffness.  Neurological: Positive for dizziness (Felt "wobbly" while walking this morning) and headaches. Negative for weakness and numbness.  All other systems reviewed and are negative.     Allergies  Compazine; Decadron; Geodon; Imitrex; Metoclopramide hcl;  Other; Penicillins; Wellbutrin; and Zofran  Home Medications   Prior to Admission medications   Medication Sig Start Date End Date Taking? Authorizing Provider  amphetamine-dextroamphetamine (ADDERALL XR) 10 MG 24 hr capsule Take 20 mg by mouth.    Historical Provider, MD  amphetamine-dextroamphetamine (ADDERALL) 10 MG tablet Take 1 tablet (10 mg total) by mouth 2 (two) times daily. For ADHD Patient not taking: Reported on 04/21/2015 10/23/13   Sanjuana KavaAgnes I Nwoko, NP  busPIRone (BUSPAR) 10 MG tablet Take 1 tablet (10 mg total) by mouth 2 (two) times daily. For anxiety Patient not taking: Reported on 03/12/2014 10/23/13   Sanjuana KavaAgnes I Nwoko, NP  butalbital-acetaminophen-caffeine (FIORICET) [phone] MG per tablet Take 1 tablet by mouth every 6 (six) hours as needed for headache. Patient not taking: Reported on 03/02/2014 02/19/14   Joni ReiningNicole Pisciotta, PA-C  clonazePAM (KLONOPIN) 1 MG tablet Take 1 mg by mouth 2 (two) times daily as needed for anxiety.    Historical Provider, MD  cyclobenzaprine (FLEXERIL) 10 MG tablet Take 1 tablet (10 mg total) by mouth 2 (two) times daily as needed for muscle spasms. Patient not taking: Reported on 02/14/2015 01/13/15   Danelle BerryLeisa Tapia, PA-C  diazepam (VALIUM) 10 MG tablet Take 1 tablet (10 mg total) by mouth 3 (three) times daily. For anxiety Patient not taking: Reported on 04/09/2014 10/23/13   Sanjuana KavaAgnes I Nwoko, NP  HYDROcodone-acetaminophen (NORCO/VICODIN) 5-325 MG tablet Take 2 tablets by mouth every 4 (four) hours as needed. Patient not taking: Reported on 02/14/2015 01/13/15  Danelle Berry, PA-C  lamoTRIgine (LAMICTAL) 25 MG tablet Take 50 mg by mouth daily.     Historical Provider, MD  levothyroxine (SYNTHROID, LEVOTHROID) 125 MCG tablet Take 1 tablet (125 mcg total) by mouth daily before breakfast. For low thyroid function Patient not taking: Reported on 02/14/2015 10/23/13   Sanjuana Kava, NP  lithium carbonate 300 MG capsule Take 1 capsule (300 mg total) by mouth 2 (two) times daily  with a meal. For mood stabilization Patient not taking: Reported on 02/14/2015 10/23/13   Sanjuana Kava, NP  methocarbamol (ROBAXIN) 500 MG tablet Take 2 tablets (1,000 mg total) by mouth 4 (four) times daily as needed (Pain). Patient not taking: Reported on 03/02/2014 02/19/14   Joni Reining Pisciotta, PA-C  morphine (MS CONTIN) 15 MG 12 hr tablet Take 1 tablet (15 mg total) by mouth every 12 (twelve) hours. For severe pain Patient not taking: Reported on 12/25/2013 10/23/13   Sanjuana Kava, NP  oxyCODONE (OXY IR/ROXICODONE) 5 MG immediate release tablet Take 1 tablet (5 mg total) by mouth every 6 (six) hours as needed for severe pain or breakthrough pain. Patient not taking: Reported on 03/08/2014 10/23/13   Sanjuana Kava, NP  prazosin (MINIPRESS) 2 MG capsule Take 2 mg by mouth at bedtime. 12/17/14   Historical Provider, MD  traZODone (DESYREL) 100 MG tablet Take 1 tablet (100 mg total) by mouth at bedtime as needed for sleep. For sleep Patient taking differently: Take 300 mg by mouth at bedtime as needed for sleep. For sleep 10/23/13   Sanjuana Kava, NP  venlafaxine XR (EFFEXOR-XR) 150 MG 24 hr capsule Take 1 capsule (150 mg total) by mouth daily with breakfast. For depression Patient taking differently: Take 150 mg by mouth 2 (two) times daily. For depression 10/23/13   Sanjuana Kava, NP   BP 95/66 mmHg  Pulse 66  Temp(Src) 98.8 F (37.1 C) (Oral)  Resp 18  SpO2 95%  LMP 12/04/2010 Physical Exam  Constitutional: She is oriented to person, place, and time. She appears well-developed and well-nourished. No distress.  Resting comfortably in stretcher  HENT:  Head: Normocephalic and atraumatic.  Right Ear: External ear normal.  Left Ear: External ear normal.  Nose: Nose normal.  Eyes: EOM are normal. Pupils are equal, round, and reactive to light. Right eye exhibits no discharge. Left eye exhibits no discharge.  Neck: Normal range of motion. Neck supple.  Cardiovascular: Normal rate, regular rhythm  and normal heart sounds.   Pulmonary/Chest: Effort normal and breath sounds normal.  Abdominal: Soft. She exhibits no distension. There is no tenderness.  Neurological: She is alert and oriented to person, place, and time.  CN 2-12 grossly intact. 5/5 strength in all 4 extremities. Grossly normal sensation  Skin: Skin is warm and dry. She is not diaphoretic.  Nursing note and vitals reviewed.   ED Course  Procedures (including critical care time) Labs Review Labs Reviewed  I-STAT CHEM 8, ED - Abnormal; Notable for the following:    Creatinine, Ser 1.30 (*)    All other components within normal limits    Imaging Review No results found. I have personally reviewed and evaluated these images and lab results as part of my medical decision-making.   EKG Interpretation None      MDM   Final diagnoses:  Chronic migraine    Patient's creatinine is mildly elevated at 1.3. However her BUN is 9 and no acidosis. I suspect this is likely subacute or chronic.  She does have borderline low blood pressures here. She is not currently dizzy. Her blood pressure did come up into the low 100s/110s with IV fluid boluses. However she is not tachycardic or febrile to be suspecting sepsis or significant hypovolemia. Otherwise patient appears to have a chronic headache. She is requesting something stronger such as a one-time narcotic. Her care plan has been reviewed and I have discussed that narcotics are not indicated for both headaches and not for chronic pain. Patient is currently stable for discharge. She reports that she is looking for a PCP. Discussed return precautions.    Pricilla Loveless, MD 06/09/15 [phone]

## 2016-03-14 ENCOUNTER — Encounter (HOSPITAL_COMMUNITY): Payer: Self-pay | Admitting: Emergency Medicine

## 2016-03-14 ENCOUNTER — Emergency Department (HOSPITAL_COMMUNITY)
Admission: EM | Admit: 2016-03-14 | Discharge: 2016-03-14 | Disposition: A | Discharge status: Home / Self Care | Payer: Medicare Other | Attending: Emergency Medicine | Admitting: Emergency Medicine

## 2016-03-14 DIAGNOSIS — F17210 Nicotine dependence, cigarettes, uncomplicated: Secondary | ICD-10-CM | POA: Diagnosis not present

## 2016-03-14 DIAGNOSIS — F909 Attention-deficit hyperactivity disorder, unspecified type: Secondary | ICD-10-CM | POA: Insufficient documentation

## 2016-03-14 DIAGNOSIS — Z79899 Other long term (current) drug therapy: Secondary | ICD-10-CM | POA: Diagnosis not present

## 2016-03-14 DIAGNOSIS — R1114 Bilious vomiting: Secondary | ICD-10-CM

## 2016-03-14 DIAGNOSIS — G43809 Other migraine, not intractable, without status migrainosus: Secondary | ICD-10-CM

## 2016-03-14 DIAGNOSIS — R51 Headache: Secondary | ICD-10-CM | POA: Diagnosis present

## 2016-03-14 LAB — COMPREHENSIVE METABOLIC PANEL
ALBUMIN: 3.6 g/dL (ref 3.5–5.0)
ALT: 13 U/L — ABNORMAL LOW (ref 14–54)
AST: 15 U/L (ref 15–41)
Alkaline Phosphatase: 58 U/L (ref 38–126)
Anion gap: 6 (ref 5–15)
BUN: 15 mg/dL (ref 6–20)
CHLORIDE: 110 mmol/L (ref 101–111)
CO2: 23 mmol/L (ref 22–32)
Calcium: 9.1 mg/dL (ref 8.9–10.3)
Creatinine, Ser: 0.65 mg/dL (ref 0.44–1.00)
GFR calc Af Amer: >60 mL/min (ref >60)
GFR calc non Af Amer: >60 mL/min (ref >60)
GLUCOSE: 90 mg/dL (ref 65–99)
POTASSIUM: 3.9 mmol/L (ref 3.5–5.1)
Sodium: 139 mmol/L (ref 135–145)
Total Bilirubin: 0.2 mg/dL — ABNORMAL LOW (ref 0.3–1.2)
Total Protein: 6.5 g/dL (ref 6.5–8.1)

## 2016-03-14 LAB — CBC WITH DIFFERENTIAL/PLATELET
BASOS PCT: 1 %
Basophils Absolute: 0 10*3/uL (ref 0.0–0.1)
EOS PCT: 2 %
Eosinophils Absolute: 0.1 10*3/uL (ref 0.0–0.7)
HCT: 36.1 % (ref 36.0–46.0)
Hemoglobin: 12.1 g/dL (ref 12.0–15.0)
Lymphocytes Relative: 34 %
Lymphs Abs: 2.6 10*3/uL (ref 0.7–4.0)
MCH: 31.3 pg (ref 26.0–34.0)
MCHC: 33.5 g/dL (ref 30.0–36.0)
MCV: 93.3 fL (ref 78.0–100.0)
MONO ABS: 0.4 10*3/uL (ref 0.1–1.0)
Monocytes Relative: 5 %
NEUTROS ABS: 4.6 10*3/uL (ref 1.7–7.7)
Neutrophils Relative %: 60 %
PLATELETS: 238 10*3/uL (ref 150–400)
RBC: 3.87 MIL/uL (ref 3.87–5.11)
RDW: 13.5 % (ref 11.5–15.5)
WBC: 7.8 10*3/uL (ref 4.0–10.5)

## 2016-03-14 LAB — I-STAT TROPONIN, ED: Troponin i, poc: 0 ng/mL (ref 0.00–0.08)

## 2016-03-14 LAB — LIPASE, BLOOD: Lipase: 23 U/L (ref 11–51)

## 2016-03-14 MED ORDER — PROMETHAZINE HCL 25 MG PO TABS: 25.0000 mg | ORAL_TABLET | Freq: Four times a day (QID) | ORAL | 0 refills | Status: AC | PRN

## 2016-03-14 MED ORDER — KETOROLAC TROMETHAMINE 30 MG/ML IJ SOLN
30.0000 mg | Freq: Once | INTRAMUSCULAR | Status: AC
  Administered 2016-03-14: 30 mg via INTRAVENOUS
  Filled 2016-03-14: qty 1

## 2016-03-14 MED ORDER — DIPHENHYDRAMINE HCL 50 MG/ML IJ SOLN
25.0000 mg | Freq: Once | INTRAMUSCULAR | Status: AC
  Administered 2016-03-14: 25 mg via INTRAVENOUS
  Filled 2016-03-14: qty 1

## 2016-03-14 MED ORDER — SODIUM CHLORIDE 0.9 % IV BOLUS (SEPSIS)
1000.0000 mL | Freq: Once | INTRAVENOUS | Status: AC
  Administered 2016-03-14: 1000 mL via INTRAVENOUS

## 2016-03-14 MED ORDER — PROMETHAZINE HCL 25 MG/ML IJ SOLN
12.5000 mg | Freq: Once | INTRAMUSCULAR | Status: AC
  Administered 2016-03-14: 12.5 mg via INTRAVENOUS
  Filled 2016-03-14: qty 1

## 2016-03-14 NOTE — ED Notes (Signed)
Pt given ginger ale.

## 2016-03-14 NOTE — ED Triage Notes (Signed)
Per EMS pt from home lives with mother c/o neck,sternum, and back pain for 2 weeks. Headache for past 4 days and nausea and vomiting onset this morning. Pt is requesting fluids and phenergan. Reports pain 10/10.

## 2016-03-14 NOTE — ED Notes (Signed)
Bed: [id number] Expected date:  Expected time:  Means of arrival:  Comments: 51 yo HA, N/V, gen ache

## 2016-03-14 NOTE — ED Notes (Signed)
Patient ambulated to restroom requesting more phenergan and benadryl.

## 2016-03-14 NOTE — ED Provider Notes (Signed)
[facility]-EMERGENCY DEPT Provider Note   CSN: [id number] Arrival date & time: 03/14/16  1224     History   Chief Complaint Chief Complaint  Patient presents with  . Headache  . Emesis    HPI [name] is a 51 y.o. female. Chief complaint is vomiting.  HPI:  Patient has a history of PTSD, chronic headaches, chronic neck pain. She has a care treatment plan outlined for her headaches. She states that she has been vomiting since this morning. States that this happens fairly often. She states that she was at [facility] recently for her chronic pain and "they didn't do anything for me".  Patient has epigastric pain. This relieved temporarily with vomiting. No hematemesis. No diarrhea today. No fever. No chest pain or dyspnea. States her left side or head is throbbing and hurts in her neck is pain.  Past Medical History:  Diagnosis Date  . ADHD (attention deficit hyperactivity disorder)   . Anxiety   . Depression   . High cholesterol   . Hypothyroid   . Migraine   . Personality disorder   . PTSD (post-traumatic stress disorder)     Patient Active Problem List   Diagnosis Date Noted  . Headache, migraine   . PTSD (post-traumatic stress disorder) 10/19/2013  . MDD (major depressive disorder) 10/18/2013  . Major depression 06/15/2013    Past Surgical History:  Procedure Laterality Date  . CHOLECYSTECTOMY    . MANDIBLE FRACTURE SURGERY      OB History    No data available       Home Medications    Prior to Admission medications   Medication Sig Start Date End Date Taking? Authorizing Provider  amphetamine-dextroamphetamine (ADDERALL XR) 10 MG 24 hr capsule Take 20 mg by mouth.    Historical Provider, MD  amphetamine-dextroamphetamine (ADDERALL) 10 MG tablet Take 1 tablet (10 mg total) by mouth 2 (two) times daily. For ADHD Patient not taking: Reported on 04/21/2015 10/23/13   Sanjuana Kava, NP  busPIRone (BUSPAR) 10 MG tablet Take 1 tablet (10 mg total) by  mouth 2 (two) times daily. For anxiety Patient not taking: Reported on 03/12/2014 10/23/13   Sanjuana Kava, NP  butalbital-acetaminophen-caffeine (FIORICET) [phone] MG per tablet Take 1 tablet by mouth every 6 (six) hours as needed for headache. Patient not taking: Reported on 03/02/2014 02/19/14   Joni Reining Pisciotta, PA-C  clonazePAM (KLONOPIN) 1 MG tablet Take 1 mg by mouth 2 (two) times daily as needed for anxiety.    Historical Provider, MD  cyclobenzaprine (FLEXERIL) 10 MG tablet Take 1 tablet (10 mg total) by mouth 2 (two) times daily as needed for muscle spasms. Patient not taking: Reported on 02/14/2015 01/13/15   Danelle Berry, PA-C  diazepam (VALIUM) 10 MG tablet Take 1 tablet (10 mg total) by mouth 3 (three) times daily. For anxiety Patient not taking: Reported on 04/09/2014 10/23/13   Sanjuana Kava, NP  HYDROcodone-acetaminophen (NORCO/VICODIN) 5-325 MG tablet Take 2 tablets by mouth every 4 (four) hours as needed. Patient not taking: Reported on 02/14/2015 01/13/15   Danelle Berry, PA-C  lamoTRIgine (LAMICTAL) 150 MG tablet Take 150 mg by mouth daily.    Historical Provider, MD  lamoTRIgine (LAMICTAL) 25 MG tablet Take 50 mg by mouth daily.     Historical Provider, MD  levothyroxine (SYNTHROID, LEVOTHROID) 125 MCG tablet Take 1 tablet (125 mcg total) by mouth daily before breakfast. For low thyroid function Patient not taking: Reported on 02/14/2015 10/23/13  Sanjuana Kava, NP  lithium carbonate 300 MG capsule Take 1 capsule (300 mg total) by mouth 2 (two) times daily with a meal. For mood stabilization Patient not taking: Reported on 02/14/2015 10/23/13   Sanjuana Kava, NP  methocarbamol (ROBAXIN) 500 MG tablet Take 2 tablets (1,000 mg total) by mouth 4 (four) times daily as needed (Pain). Patient not taking: Reported on 03/02/2014 02/19/14   Joni Reining Pisciotta, PA-C  morphine (MS CONTIN) 15 MG 12 hr tablet Take 1 tablet (15 mg total) by mouth every 12 (twelve) hours. For severe pain Patient not taking:  Reported on 12/25/2013 10/23/13   Sanjuana Kava, NP  oxyCODONE (OXY IR/ROXICODONE) 5 MG immediate release tablet Take 1 tablet (5 mg total) by mouth every 6 (six) hours as needed for severe pain or breakthrough pain. Patient not taking: Reported on 03/08/2014 10/23/13   Sanjuana Kava, NP  prazosin (MINIPRESS) 2 MG capsule Take 2 mg by mouth at bedtime. 12/17/14   Historical Provider, MD  promethazine (PHENERGAN) 25 MG tablet Take 1 tablet (25 mg total) by mouth every 6 (six) hours as needed for nausea or vomiting. 03/14/16   Rolland Porter, MD  traZODone (DESYREL) 100 MG tablet Take 1 tablet (100 mg total) by mouth at bedtime as needed for sleep. For sleep Patient taking differently: Take 300 mg by mouth at bedtime as needed for sleep. For sleep 10/23/13   Sanjuana Kava, NP  venlafaxine XR (EFFEXOR-XR) 150 MG 24 hr capsule Take 1 capsule (150 mg total) by mouth daily with breakfast. For depression Patient taking differently: Take 150 mg by mouth 2 (two) times daily. For depression 10/23/13   Sanjuana Kava, NP    Family History No family history on file.  Social History Social History  Substance Use Topics  . Smoking status: Current Every Day Smoker    Packs/day: 1.00    Types: Cigarettes  . Smokeless tobacco: Not on file  . Alcohol use No     Allergies   Compazine; Decadron [dexamethasone]; Geodon [ziprasidone hydrochloride]; Imitrex [sumatriptan base]; Metoclopramide hcl; Other; Penicillins; Wellbutrin [bupropion hcl]; and Zofran   Review of Systems Review of Systems  Constitutional: Negative for appetite change, chills, diaphoresis, fatigue and fever.  HENT: Negative for mouth sores, sore throat and trouble swallowing.   Eyes: Negative for visual disturbance.  Respiratory: Negative for cough, chest tightness, shortness of breath and wheezing.   Cardiovascular: Negative for chest pain.  Gastrointestinal: Positive for abdominal pain and nausea. Negative for abdominal distention, diarrhea and  vomiting.  Endocrine: Negative for polydipsia, polyphagia and polyuria.  Genitourinary: Negative for dysuria, frequency and hematuria.  Musculoskeletal: Positive for neck pain. Negative for gait problem.  Skin: Negative for color change, pallor and rash.  Neurological: Positive for headaches. Negative for dizziness, syncope and light-headedness.  Hematological: Does not bruise/bleed easily.  Psychiatric/Behavioral: Negative for behavioral problems and confusion.     Physical Exam Updated Vital Signs BP 121/81   Pulse 65   Temp 98.3 F (36.8 C) (Oral)   Resp 21   Ht 5\' 3"  (1.6 m)   Wt 135 lb (61.2 kg)   LMP 12/04/2010   SpO2 98%   BMI 23.91 kg/m   Physical Exam  Constitutional: She is oriented to person, place, and time. She appears well-developed and well-nourished. No distress.  HENT:  Head: Normocephalic.  Eyes: Conjunctivae are normal. Pupils are equal, round, and reactive to light. No scleral icterus.  Neck: Normal range of motion. Neck  supple. No thyromegaly present.  Cardiovascular: Normal rate and regular rhythm.  Exam reveals no gallop and no friction rub.   No murmur heard. Pulmonary/Chest: Effort normal and breath sounds normal. No respiratory distress. She has no wheezes. She has no rales.  Abdominal: Soft. Bowel sounds are normal. She exhibits no distension. There is no tenderness. There is no rebound.  Musculoskeletal: Normal range of motion.  Neurological: She is alert and oriented to person, place, and time.  Skin: Skin is warm and dry. No rash noted.  Psychiatric: She has a normal mood and affect. Her behavior is normal.     ED Treatments / Results  Labs (all labs ordered are listed, but only abnormal results are displayed) Labs Reviewed  COMPREHENSIVE METABOLIC PANEL - Abnormal; Notable for the following:       Result Value   ALT 13 (*)    Total Bilirubin 0.2 (*)    All other components within normal limits  CBC WITH DIFFERENTIAL/PLATELET  LIPASE,  BLOOD  I-STAT TROPOININ, ED    EKG  EKG Interpretation  Date/Time:  Sunday March 14 2016 13:08:51 EST Ventricular Rate:  73 PR Interval:    QRS Duration: 89 QT Interval:  383 QTC Calculation: 422 R Axis:   67 Text Interpretation:  Sinus rhythm Consider left atrial enlargement Baseline wander in lead(s) V1 V2 V6 Confirmed by Fayrene FearingJAMES  MD, Ekam Besson ([id number]) on 03/14/2016 1:52:17 PM       Radiology No results found.  Procedures Procedures (including critical care time)  Medications Ordered in ED Medications  sodium chloride 0.9 % bolus 1,000 mL (not administered)  diphenhydrAMINE (BENADRYL) injection 25 mg (not administered)  promethazine (PHENERGAN) injection 12.5 mg (not administered)  sodium chloride 0.9 % bolus 1,000 mL (0 mLs Intravenous Stopped 03/14/16 1432)  promethazine (PHENERGAN) injection 12.5 mg (12.5 mg Intravenous Given 03/14/16 1351)  diphenhydrAMINE (BENADRYL) injection 25 mg (25 mg Intravenous Given 03/14/16 1351)  ketorolac (TORADOL) 30 MG/ML injection 30 mg (30 mg Intravenous Given 03/14/16 1350)     Initial Impression / Assessment and Plan / ED Course  I have reviewed the triage vital signs and the nursing notes.  Pertinent labs & imaging results that were available during my care of the patient were reviewed by me and considered in my medical decision making (see chart for details).     I had a frank discussion with her that I would not address her chronic headache and neck pain that would address her nausea. She has for something "for my nerves". I declined IV medication for this. Tolerating by mouth and would be willing to give her a dose of her prescribed Klonopin at discharge. Await IV fluid hydration, antiemetics, patient's response to treatment, lab evaluation.   14:41:  Patient has not had any emesis while here. Reassuring labs, EKG, troponin. States the Benadryl has helped her anxiety. Requests additional treatment. We'll give a second bag of fluid,  repeat Phenergan and Benadryl. Then told her that she will be discharged home to continue her regular medications. Prescription for Phenergan.  Final Clinical Impressions(s) / ED Diagnoses   Final diagnoses:  Other migraine without status migrainosus, not intractable  Bilious vomiting with nausea    New Prescriptions New Prescriptions   PROMETHAZINE (PHENERGAN) 25 MG TABLET    Take 1 tablet (25 mg total) by mouth every 6 (six) hours as needed for nausea or vomiting.     Rolland PorterMark Torsha Lemus, MD 03/14/16 [phone]

## 2016-03-14 NOTE — ED Notes (Signed)
Pt ambulatory and independent at discharge.  Verbalized understanding of discharge instructions 

## 2016-05-03 ENCOUNTER — Encounter (HOSPITAL_COMMUNITY): Payer: Self-pay

## 2016-05-03 ENCOUNTER — Emergency Department (HOSPITAL_COMMUNITY)
Admission: EM | Admit: 2016-05-03 | Discharge: 2016-05-03 | Disposition: A | Discharge status: Home / Self Care | Payer: Medicare Other | Attending: Dermatology | Admitting: Dermatology

## 2016-05-03 DIAGNOSIS — Z5321 Procedure and treatment not carried out due to patient leaving prior to being seen by health care provider: Secondary | ICD-10-CM | POA: Diagnosis not present

## 2016-05-03 DIAGNOSIS — R51 Headache: Secondary | ICD-10-CM | POA: Diagnosis present

## 2016-05-03 NOTE — ED Triage Notes (Signed)
Pt states she began having headache with neck pain yesterday. She reports she also has had nausea associated with the headache.

## 2016-05-03 NOTE — ED Notes (Signed)
Pt put her stuff down and said she was leaving.

## 2016-07-02 ENCOUNTER — Ambulatory Visit (HOSPITAL_COMMUNITY)
Admission: EM | Admit: 2016-07-02 | Discharge: 2016-07-02 | Disposition: A | Discharge status: Home / Self Care | Payer: Medicare Other | Attending: Internal Medicine | Admitting: Internal Medicine

## 2016-07-02 ENCOUNTER — Encounter (HOSPITAL_COMMUNITY): Payer: Self-pay

## 2016-07-02 DIAGNOSIS — G43009 Migraine without aura, not intractable, without status migrainosus: Secondary | ICD-10-CM

## 2016-07-02 MED ORDER — DIPHENHYDRAMINE HCL 50 MG/ML IJ SOLN
INTRAMUSCULAR | Status: AC
  Filled 2016-07-02: qty 1

## 2016-07-02 MED ORDER — ONDANSETRON 4 MG PO TBDP
ORAL_TABLET | ORAL | Status: AC
  Filled 2016-07-02: qty 1

## 2016-07-02 MED ORDER — DIPHENHYDRAMINE HCL 50 MG/ML IJ SOLN
25.0000 mg | Freq: Once | INTRAMUSCULAR | Status: AC
  Administered 2016-07-02: 25 mg via INTRAMUSCULAR

## 2016-07-02 MED ORDER — KETOROLAC TROMETHAMINE 60 MG/2ML IM SOLN
60.0000 mg | Freq: Once | INTRAMUSCULAR | Status: AC
  Administered 2016-07-02: 60 mg via INTRAMUSCULAR

## 2016-07-02 MED ORDER — KETOROLAC TROMETHAMINE 60 MG/2ML IM SOLN
INTRAMUSCULAR | Status: AC
  Filled 2016-07-02: qty 2

## 2016-07-02 NOTE — ED Provider Notes (Signed)
CSN: [id number]     Arrival date & time 07/02/16  1523 History   None    Chief Complaint  Patient presents with  . Migraine   (Consider location/radiation/quality/duration/timing/severity/associated sxs/prior Treatment) Patient c/o migraine headache, nausea, phonophotophobia, and cervicalgia.   The history is provided by the patient.  Migraine  This is a new problem. The problem occurs constantly. Associated symptoms include headaches.    Past Medical History:  Diagnosis Date  . ADHD (attention deficit hyperactivity disorder)   . Anxiety   . Depression   . High cholesterol   . Hypothyroid   . Migraine   . Personality disorder   . PTSD (post-traumatic stress disorder)    Past Surgical History:  Procedure Laterality Date  . CHOLECYSTECTOMY    . MANDIBLE FRACTURE SURGERY     No family history on file. Social History  Substance Use Topics  . Smoking status: Current Every Day Smoker    Packs/day: 1.00    Types: Cigarettes  . Smokeless tobacco: Never Used  . Alcohol use No   OB History    No data available     Review of Systems  Constitutional: Negative.   HENT: Negative.   Eyes: Negative.   Respiratory: Negative.   Cardiovascular: Negative.   Gastrointestinal: Negative.   Endocrine: Negative.   Genitourinary: Negative.   Allergic/Immunologic: Negative.   Neurological: Positive for headaches.    Allergies  Compazine; Decadron [dexamethasone]; Nsaids; Geodon [ziprasidone hydrochloride]; Imitrex [sumatriptan base]; Metoclopramide hcl; Other; Penicillins; Wellbutrin [bupropion hcl]; and Zofran  Home Medications   Prior to Admission medications   Medication Sig Start Date End Date Taking? Authorizing Provider  atorvastatin (LIPITOR) 10 MG tablet Take 10 mg by mouth daily.   Yes [provider]  butalbital-acetaminophen-caffeine (FIORICET) 50-325-40 MG per tablet Take 1 tablet by mouth every 6 (six) hours as needed for headache. 02/19/14  Yes Pisciotta,  Joni ReiningNicole, PA-C  cholecalciferol (VITAMIN D) 1000 units tablet Take 1,000 Units by mouth daily.   Yes [provider]  clonazePAM (KLONOPIN) 1 MG tablet Take 1 mg by mouth 2 (two) times daily as needed for anxiety.   Yes [provider]  lamoTRIgine (LAMICTAL) 200 MG tablet Take 200 mg by mouth daily.    Yes [provider]  levothyroxine (SYNTHROID, LEVOTHROID) 125 MCG tablet Take 1 tablet (125 mcg total) by mouth daily before breakfast. For low thyroid function 10/23/13  Yes Nwoko, Nicole KindredAgnes I, NP  methocarbamol (ROBAXIN) 500 MG tablet Take 2 tablets (1,000 mg total) by mouth 4 (four) times daily as needed (Pain). 02/19/14  Yes Pisciotta, Joni ReiningNicole, PA-C  paliperidone (INVEGA) 9 MG 24 hr tablet Take 9 mg by mouth every morning.   Yes [provider]  prazosin (MINIPRESS) 2 MG capsule Take 2 mg by mouth at bedtime. 12/17/14  Yes [provider]  promethazine (PHENERGAN) 25 MG tablet Take 1 tablet (25 mg total) by mouth every 6 (six) hours as needed for nausea or vomiting. 03/14/16  Yes Rolland PorterJames, Mark, MD  traZODone (DESYREL) 100 MG tablet Take 1 tablet (100 mg total) by mouth at bedtime as needed for sleep. For sleep Patient taking differently: Take 300 mg by mouth at bedtime as needed for sleep. For sleep 10/23/13  Yes Armandina StammerNwoko, Agnes I, NP  venlafaxine XR (EFFEXOR-XR) 150 MG 24 hr capsule Take 1 capsule (150 mg total) by mouth daily with breakfast. For depression Patient taking differently: Take 150 mg by mouth 2 (two) times daily. For depression 10/23/13  Yes Armandina Stammer I, NP  amphetamine-dextroamphetamine (ADDERALL) 10 MG tablet Take 1 tablet (10 mg total) by mouth 2 (two) times daily. For ADHD Patient not taking: Reported on 04/21/2015 10/23/13   Armandina Stammer I, NP  busPIRone (BUSPAR) 10 MG tablet Take 1 tablet (10 mg total) by mouth 2 (two) times daily. For anxiety Patient not taking: Reported on 03/12/2014 10/23/13   Armandina Stammer I, NP  cyclobenzaprine (FLEXERIL) 10 MG  tablet Take 1 tablet (10 mg total) by mouth 2 (two) times daily as needed for muscle spasms. Patient not taking: Reported on 02/14/2015 01/13/15   Danelle Berry, PA-C  diazepam (VALIUM) 10 MG tablet Take 1 tablet (10 mg total) by mouth 3 (three) times daily. For anxiety Patient not taking: Reported on 04/09/2014 10/23/13   Armandina Stammer I, NP  HYDROcodone-acetaminophen (NORCO/VICODIN) 5-325 MG tablet Take 2 tablets by mouth every 4 (four) hours as needed. Patient not taking: Reported on 02/14/2015 01/13/15   Danelle Berry, PA-C  levothyroxine (SYNTHROID, LEVOTHROID) 100 MCG tablet Take 100 mcg by mouth daily before breakfast.    [provider]  lithium carbonate 300 MG capsule Take 1 capsule (300 mg total) by mouth 2 (two) times daily with a meal. For mood stabilization Patient not taking: Reported on 02/14/2015 10/23/13   Armandina Stammer I, NP  morphine (MS CONTIN) 15 MG 12 hr tablet Take 1 tablet (15 mg total) by mouth every 12 (twelve) hours. For severe pain Patient not taking: Reported on 12/25/2013 10/23/13   Armandina Stammer I, NP  oxyCODONE (OXY IR/ROXICODONE) 5 MG immediate release tablet Take 1 tablet (5 mg total) by mouth every 6 (six) hours as needed for severe pain or breakthrough pain. Patient not taking: Reported on 03/08/2014 10/23/13   Armandina Stammer I, NP   Meds Ordered and Administered this Visit   Medications  ketorolac (TORADOL) injection 60 mg (60 mg Intramuscular Given 07/02/16 1638)  diphenhydrAMINE (BENADRYL) injection 25 mg (25 mg Intramuscular Given 07/02/16 1638)    BP 118/72 (BP Location: Left Arm)   Pulse 87   Temp 99 F (37.2 C) (Oral)   Resp 20   LMP 12/04/2010   SpO2 99%  No data found.   Physical Exam  Constitutional: She is oriented to person, place, and time. She appears well-developed and well-nourished.  HENT:  Head: Normocephalic and atraumatic.  Eyes: Conjunctivae and EOM are normal. Pupils are equal, round, and reactive to light.  Neck: Normal range of  motion. Neck supple.  Cardiovascular: Normal rate, regular rhythm and normal heart sounds.   Pulmonary/Chest: Effort normal and breath sounds normal.  Abdominal: Soft. Bowel sounds are normal.  Neurological: She is alert and oriented to person, place, and time.  Nursing note and vitals reviewed.   Urgent Care Course     Procedures (including critical care time)  Labs Review Labs Reviewed - No data to display  Imaging Review No results found.   Visual Acuity Review  Right Eye Distance:   Left Eye Distance:   Bilateral Distance:    Right Eye Near:   Left Eye Near:    Bilateral Near:         MDM   1. Migraine without aura and without status migrainosus, not intractable    Toradol 60mg  IM Benadryl 25mg  IM      Deatra Canter, FNP 07/02/16 2007

## 2016-07-02 NOTE — ED Triage Notes (Signed)
Pt is having a migraine since yesterday along with neck pain and nausea. Did take robaxin and fioricet and not helping.

## 2016-07-04 ENCOUNTER — Encounter (HOSPITAL_COMMUNITY): Payer: Self-pay

## 2016-07-04 ENCOUNTER — Emergency Department (HOSPITAL_COMMUNITY)
Admission: EM | Admit: 2016-07-04 | Discharge: 2016-07-04 | Disposition: A | Discharge status: Home / Self Care | Payer: Medicare Other | Attending: Emergency Medicine | Admitting: Emergency Medicine

## 2016-07-04 DIAGNOSIS — Z79899 Other long term (current) drug therapy: Secondary | ICD-10-CM | POA: Insufficient documentation

## 2016-07-04 DIAGNOSIS — R45851 Suicidal ideations: Secondary | ICD-10-CM

## 2016-07-04 DIAGNOSIS — F909 Attention-deficit hyperactivity disorder, unspecified type: Secondary | ICD-10-CM | POA: Diagnosis not present

## 2016-07-04 DIAGNOSIS — F17210 Nicotine dependence, cigarettes, uncomplicated: Secondary | ICD-10-CM | POA: Insufficient documentation

## 2016-07-04 DIAGNOSIS — E039 Hypothyroidism, unspecified: Secondary | ICD-10-CM | POA: Diagnosis not present

## 2016-07-04 DIAGNOSIS — R51 Headache: Secondary | ICD-10-CM | POA: Diagnosis not present

## 2016-07-04 DIAGNOSIS — F603 Borderline personality disorder: Secondary | ICD-10-CM | POA: Diagnosis not present

## 2016-07-04 DIAGNOSIS — R519 Headache, unspecified: Secondary | ICD-10-CM

## 2016-07-04 HISTORY — DX: Bipolar disorder, unspecified: F31.9

## 2016-07-04 HISTORY — DX: Schizoaffective disorder, unspecified: F25.9

## 2016-07-04 LAB — COMPREHENSIVE METABOLIC PANEL
ALBUMIN: 3.8 g/dL (ref 3.5–5.0)
ALT: 12 U/L — AB (ref 14–54)
AST: 17 U/L (ref 15–41)
Alkaline Phosphatase: 72 U/L (ref 38–126)
Anion gap: 9 (ref 5–15)
BUN: 6 mg/dL (ref 6–20)
CHLORIDE: 105 mmol/L (ref 101–111)
CO2: 24 mmol/L (ref 22–32)
CREATININE: 0.82 mg/dL (ref 0.44–1.00)
Calcium: 9.6 mg/dL (ref 8.9–10.3)
GFR calc Af Amer: >60 mL/min (ref >60)
GFR calc non Af Amer: >60 mL/min (ref >60)
GLUCOSE: 94 mg/dL (ref 65–99)
POTASSIUM: 4.2 mmol/L (ref 3.5–5.1)
SODIUM: 138 mmol/L (ref 135–145)
Total Bilirubin: 0.3 mg/dL (ref 0.3–1.2)
Total Protein: 6.5 g/dL (ref 6.5–8.1)

## 2016-07-04 LAB — RAPID URINE DRUG SCREEN, HOSP PERFORMED
Amphetamines: NOT DETECTED
BENZODIAZEPINES: NOT DETECTED
Barbiturates: POSITIVE — AB
COCAINE: NOT DETECTED
Opiates: NOT DETECTED
Tetrahydrocannabinol: NOT DETECTED

## 2016-07-04 LAB — CBC
HCT: 42.2 % (ref 36.0–46.0)
Hemoglobin: 14 g/dL (ref 12.0–15.0)
MCH: 31.5 pg (ref 26.0–34.0)
MCHC: 33.2 g/dL (ref 30.0–36.0)
MCV: 94.8 fL (ref 78.0–100.0)
Platelets: 233 10*3/uL (ref 150–400)
RBC: 4.45 MIL/uL (ref 3.87–5.11)
RDW: 15.3 % (ref 11.5–15.5)
WBC: 9.1 10*3/uL (ref 4.0–10.5)

## 2016-07-04 LAB — SALICYLATE LEVEL: Salicylate Lvl: <7 mg/dL (ref 2.8–30.0)

## 2016-07-04 LAB — ACETAMINOPHEN LEVEL

## 2016-07-04 LAB — ETHANOL

## 2016-07-04 MED ORDER — THIAMINE HCL 100 MG/ML IJ SOLN: 100.0000 mg | Freq: Every day | INTRAMUSCULAR | Status: DC

## 2016-07-04 MED ORDER — VITAMIN B-1 100 MG PO TABS
100.0000 mg | ORAL_TABLET | Freq: Every day | ORAL | Status: DC
  Administered 2016-07-04: 100 mg via ORAL
  Filled 2016-07-04: qty 1

## 2016-07-04 MED ORDER — DIPHENHYDRAMINE HCL 25 MG PO CAPS
25.0000 mg | ORAL_CAPSULE | Freq: Once | ORAL | Status: AC
Start: 2016-07-04 — End: 2016-07-04
  Administered 2016-07-04: 25 mg via ORAL
  Filled 2016-07-04: qty 1

## 2016-07-04 MED ORDER — LORAZEPAM 1 MG PO TABS: 0.0000 mg | ORAL_TABLET | Freq: Two times a day (BID) | ORAL | Status: DC

## 2016-07-04 MED ORDER — ACETAMINOPHEN 325 MG PO TABS: 650.0000 mg | ORAL_TABLET | ORAL | Status: DC | PRN

## 2016-07-04 MED ORDER — KETOROLAC TROMETHAMINE 60 MG/2ML IM SOLN
60.0000 mg | Freq: Once | INTRAMUSCULAR | Status: AC
  Administered 2016-07-04: 60 mg via INTRAMUSCULAR
  Filled 2016-07-04: qty 2

## 2016-07-04 MED ORDER — LAMOTRIGINE 200 MG PO TABS
200.0000 mg | ORAL_TABLET | Freq: Every day | ORAL | Status: DC
Start: 2016-07-04 — End: 2016-07-04

## 2016-07-04 MED ORDER — LORAZEPAM 2 MG/ML IJ SOLN: 0.0000 mg | Freq: Four times a day (QID) | INTRAMUSCULAR | Status: DC

## 2016-07-04 MED ORDER — NICOTINE 21 MG/24HR TD PT24
21.0000 mg | MEDICATED_PATCH | Freq: Every day | TRANSDERMAL | Status: DC
  Administered 2016-07-04: 21 mg via TRANSDERMAL
  Filled 2016-07-04: qty 1

## 2016-07-04 MED ORDER — ATORVASTATIN CALCIUM 10 MG PO TABS: 10.0000 mg | ORAL_TABLET | Freq: Every day | ORAL | Status: DC

## 2016-07-04 MED ORDER — ZOLPIDEM TARTRATE 5 MG PO TABS: 5.0000 mg | ORAL_TABLET | Freq: Every evening | ORAL | Status: DC | PRN

## 2016-07-04 MED ORDER — LORAZEPAM 2 MG/ML IJ SOLN: 0.0000 mg | Freq: Two times a day (BID) | INTRAMUSCULAR | Status: DC

## 2016-07-04 MED ORDER — PROMETHAZINE HCL 25 MG/ML IJ SOLN
25.0000 mg | Freq: Once | INTRAMUSCULAR | Status: AC
  Administered 2016-07-04: 25 mg via INTRAMUSCULAR
  Filled 2016-07-04: qty 1

## 2016-07-04 MED ORDER — LORAZEPAM 1 MG PO TABS
0.0000 mg | ORAL_TABLET | Freq: Four times a day (QID) | ORAL | Status: DC
Start: 2016-07-04 — End: 2016-07-04
  Administered 2016-07-04: 2 mg via ORAL
  Filled 2016-07-04: qty 2

## 2016-07-04 MED ORDER — PALIPERIDONE ER 3 MG PO TB24: 9.0000 mg | ORAL_TABLET | ORAL | Status: DC

## 2016-07-04 NOTE — ED Notes (Signed)
Pt calling for a ride.  Plan to discharge home.
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
## 2016-07-04 NOTE — ED Notes (Signed)
Pt asking for migraine cocktail.  Dr. Rosalia Hammersay notified of same.
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
## 2016-07-04 NOTE — ED Provider Notes (Signed)
[facility] DEPT Provider Note   CSN: [id number] Arrival date & time: 07/04/16  1438  By signing my name below, I, Vista Mink, attest that this documentation has been prepared under the direction and in the presence of Margarita Grizzle, MD. Electronically signed, Vista Mink, ED Scribe. 07/04/16. 4:15 PM.  History   Chief Complaint Chief Complaint  Patient presents with  . Suicidal  . Homicidal    HPI HPI Comments: [name] is a 51 y.o. female, with Hx of Schizoaffective disorder, BP1, who presents to the Emergency Department complaining of SI with auditory hallucinations that started several days ago.Pt is dx Schizoaffective disorder and Bipolar 1. Pt has been taking Invega for her psychiatric issues but states that it has not been helping recently. Pt lives with her mother who helps her stay on track with medication and states that she has been compliant. Pt states: "I feel like I'm in a black hole that I can't get out of." She has had auditory hallucinations that tell her she is worthless; she is having these hallucinations currently. Pt also notes SI and admits to cutting her wrists. Pt is a current smoker 2 ppd. No alcohol or drugs. She is not in any pain currently.   The history is provided by the patient. No language interpreter was used.    Past Medical History:  Diagnosis Date  . ADHD (attention deficit hyperactivity disorder)   . Anxiety   . Bipolar 1 disorder (HCC)   . Depression   . High cholesterol   . Hypothyroid   . Migraine   . Personality disorder   . PTSD (post-traumatic stress disorder)   . Schizoaffective disorder [facility])     Patient Active Problem List   Diagnosis Date Noted  . Headache, migraine   . PTSD (post-traumatic stress disorder) 10/19/2013  . MDD (major depressive disorder) 10/18/2013  . Major depression 06/15/2013    Past Surgical History:  Procedure Laterality Date  . CHOLECYSTECTOMY    . MANDIBLE FRACTURE SURGERY      OB  History    No data available       Home Medications    Prior to Admission medications   Medication Sig Start Date End Date Taking? Authorizing Provider  amphetamine-dextroamphetamine (ADDERALL) 10 MG tablet Take 1 tablet (10 mg total) by mouth 2 (two) times daily. For ADHD Patient not taking: Reported on 04/21/2015 10/23/13   Armandina Stammer I, NP  atorvastatin (LIPITOR) 10 MG tablet Take 10 mg by mouth daily.    [provider]  busPIRone (BUSPAR) 10 MG tablet Take 1 tablet (10 mg total) by mouth 2 (two) times daily. For anxiety Patient not taking: Reported on 03/12/2014 10/23/13   Armandina Stammer I, NP  butalbital-acetaminophen-caffeine (FIORICET) [phone] MG per tablet Take 1 tablet by mouth every 6 (six) hours as needed for headache. 02/19/14   Pisciotta, Joni Reining, PA-C  cholecalciferol (VITAMIN D) 1000 units tablet Take 1,000 Units by mouth daily.    [provider]  clonazePAM (KLONOPIN) 1 MG tablet Take 1 mg by mouth 2 (two) times daily as needed for anxiety.    [provider]  cyclobenzaprine (FLEXERIL) 10 MG tablet Take 1 tablet (10 mg total) by mouth 2 (two) times daily as needed for muscle spasms. Patient not taking: Reported on 02/14/2015 01/13/15   Danelle Berry, PA-C  diazepam (VALIUM) 10 MG tablet Take 1 tablet (10 mg total) by mouth 3 (three) times daily. For anxiety Patient not taking: Reported on 04/09/2014  10/23/13   Armandina Stammer I, NP  HYDROcodone-acetaminophen (NORCO/VICODIN) 5-325 MG tablet Take 2 tablets by mouth every 4 (four) hours as needed. Patient not taking: Reported on 02/14/2015 01/13/15   Danelle Berry, PA-C  lamoTRIgine (LAMICTAL) 200 MG tablet Take 200 mg by mouth daily.     [provider]  levothyroxine (SYNTHROID, LEVOTHROID) 100 MCG tablet Take 100 mcg by mouth daily before breakfast.    [provider]  levothyroxine (SYNTHROID, LEVOTHROID) 125 MCG tablet Take 1 tablet (125 mcg total) by mouth daily before breakfast. For low  thyroid function 10/23/13   Armandina Stammer I, NP  lithium carbonate 300 MG capsule Take 1 capsule (300 mg total) by mouth 2 (two) times daily with a meal. For mood stabilization Patient not taking: Reported on 02/14/2015 10/23/13   Armandina Stammer I, NP  methocarbamol (ROBAXIN) 500 MG tablet Take 2 tablets (1,000 mg total) by mouth 4 (four) times daily as needed (Pain). 02/19/14   Pisciotta, Joni Reining, PA-C  morphine (MS CONTIN) 15 MG 12 hr tablet Take 1 tablet (15 mg total) by mouth every 12 (twelve) hours. For severe pain Patient not taking: Reported on 12/25/2013 10/23/13   Armandina Stammer I, NP  oxyCODONE (OXY IR/ROXICODONE) 5 MG immediate release tablet Take 1 tablet (5 mg total) by mouth every 6 (six) hours as needed for severe pain or breakthrough pain. Patient not taking: Reported on 03/08/2014 10/23/13   Armandina Stammer I, NP  paliperidone (INVEGA) 9 MG 24 hr tablet Take 9 mg by mouth every morning.    [provider]  prazosin (MINIPRESS) 2 MG capsule Take 2 mg by mouth at bedtime. 12/17/14   [provider]  promethazine (PHENERGAN) 25 MG tablet Take 1 tablet (25 mg total) by mouth every 6 (six) hours as needed for nausea or vomiting. 03/14/16   Rolland Porter, MD  traZODone (DESYREL) 100 MG tablet Take 1 tablet (100 mg total) by mouth at bedtime as needed for sleep. For sleep Patient taking differently: Take 300 mg by mouth at bedtime as needed for sleep. For sleep 10/23/13   Armandina Stammer I, NP  venlafaxine XR (EFFEXOR-XR) 150 MG 24 hr capsule Take 1 capsule (150 mg total) by mouth daily with breakfast. For depression Patient taking differently: Take 150 mg by mouth 2 (two) times daily. For depression 10/23/13   Sanjuana Kava, NP    Family History History reviewed. No pertinent family history.  Social History Social History  Substance Use Topics  . Smoking status: Current Every Day Smoker    Packs/day: 2.00    Types: Cigarettes  . Smokeless tobacco: Never Used  . Alcohol use No      Allergies   Compazine; Decadron [dexamethasone]; Nsaids; Geodon [ziprasidone hydrochloride]; Imitrex [sumatriptan base]; Metoclopramide hcl; Other; Penicillins; Wellbutrin [bupropion hcl]; and Zofran   Review of Systems Review of Systems  Eyes: Negative for visual disturbance.  Skin: Negative for wound.  Psychiatric/Behavioral: Positive for hallucinations (auditory ) and suicidal ideas. The patient is nervous/anxious.   All other systems reviewed and are negative.   Physical Exam Updated Vital Signs LMP 12/04/2010   Physical Exam  Constitutional: She is oriented to person, place, and time. She appears well-developed and well-nourished. No distress.  HENT:  Head: Normocephalic and atraumatic.  Neck: Normal range of motion.  Pulmonary/Chest: Effort normal.  Neurological: She is alert and oriented to person, place, and time.  Skin: Skin is warm and dry. She is not diaphoretic.  Psychiatric: Judgment normal.  Nursing note and vitals reviewed.    ED Treatments / Results  DIAGNOSTIC STUDIES: Oxygen Saturation is 99% on RA, normal by my interpretation.  COORDINATION OF CARE: 4:14 PM-Discussed treatment plan with pt at bedside and pt agreed to plan.   Labs (all labs ordered are listed, but only abnormal results are displayed) Labs Reviewed  CBC  COMPREHENSIVE METABOLIC PANEL  ETHANOL  SALICYLATE LEVEL  ACETAMINOPHEN LEVEL  RAPID URINE DRUG SCREEN, HOSP PERFORMED    EKG  EKG Interpretation None       Radiology No results found.  Procedures Procedures (including critical care time)  Medications Ordered in ED Medications - No data to display   Initial Impression / Assessment and Plan / ED Course  I have reviewed the triage vital signs and the nursing notes.  Pertinent labs & imaging results that were available during my care of the patient were reviewed by me and considered in my medical decision making (see chart for details).   Patient now  complaining of headache- Patient care plan in chart regarding headache with plan to avoid narcotics, ct and d/c asap.  Patient [facility] to be assessed by [facility] although report to rn by tts that patient does not meet inpatient criteria.   Endorses that she no longer has suicidal or homicidal ideation and that it was fleeting. Patient states she normally gets Toradol, Benadryl, and Phenergan. I reviewed the these has stated allergies. States she gets these and has no allergic reaction. She is given Toradol IM, Phenergan IM, and Benadryl by mouth. She is calling to come and get her. Need for close follow-up and she voices understanding.  Final Clinical Impressions(s) / ED Diagnoses   Final diagnoses:  Suicidal thoughts  Borderline personality disorder  Nonintractable headache, unspecified chronicity pattern, unspecified headache type    New Prescriptions New Prescriptions   No medications on file  I personally performed the services described in this documentation, which was scribed in my presence. The recorded information has been reviewed and considered.    Margarita Grizzleay, Coralie Stanke, MD 07/04/16 [phone]

## 2016-07-04 NOTE — ED Notes (Signed)
TTS in progress.  Dr. Rosalia Hammersay aware of CIWA.

## 2017-01-30 ENCOUNTER — Other Ambulatory Visit: Payer: Self-pay

## 2017-01-30 ENCOUNTER — Emergency Department (HOSPITAL_COMMUNITY)
Admission: EM | Admit: 2017-01-30 | Discharge: 2017-01-31 | Disposition: A | Discharge status: Home / Self Care | Payer: Medicare Other | Attending: Emergency Medicine | Admitting: Emergency Medicine

## 2017-01-30 ENCOUNTER — Encounter (HOSPITAL_COMMUNITY): Payer: Self-pay | Admitting: *Deleted

## 2017-01-30 DIAGNOSIS — F329 Major depressive disorder, single episode, unspecified: Secondary | ICD-10-CM | POA: Diagnosis present

## 2017-01-30 DIAGNOSIS — X788XXA Intentional self-harm by other sharp object, initial encounter: Secondary | ICD-10-CM | POA: Diagnosis not present

## 2017-01-30 DIAGNOSIS — Y939 Activity, unspecified: Secondary | ICD-10-CM | POA: Insufficient documentation

## 2017-01-30 DIAGNOSIS — S50811A Abrasion of right forearm, initial encounter: Secondary | ICD-10-CM | POA: Insufficient documentation

## 2017-01-30 DIAGNOSIS — Z79899 Other long term (current) drug therapy: Secondary | ICD-10-CM | POA: Insufficient documentation

## 2017-01-30 DIAGNOSIS — R45851 Suicidal ideations: Secondary | ICD-10-CM | POA: Diagnosis not present

## 2017-01-30 DIAGNOSIS — Z23 Encounter for immunization: Secondary | ICD-10-CM | POA: Diagnosis not present

## 2017-01-30 DIAGNOSIS — Y929 Unspecified place or not applicable: Secondary | ICD-10-CM | POA: Insufficient documentation

## 2017-01-30 DIAGNOSIS — F17210 Nicotine dependence, cigarettes, uncomplicated: Secondary | ICD-10-CM | POA: Diagnosis not present

## 2017-01-30 DIAGNOSIS — F331 Major depressive disorder, recurrent, moderate: Secondary | ICD-10-CM | POA: Insufficient documentation

## 2017-01-30 DIAGNOSIS — S51812A Laceration without foreign body of left forearm, initial encounter: Secondary | ICD-10-CM | POA: Diagnosis not present

## 2017-01-30 DIAGNOSIS — R443 Hallucinations, unspecified: Secondary | ICD-10-CM

## 2017-01-30 DIAGNOSIS — Y999 Unspecified external cause status: Secondary | ICD-10-CM | POA: Diagnosis not present

## 2017-01-30 DIAGNOSIS — F4310 Post-traumatic stress disorder, unspecified: Secondary | ICD-10-CM | POA: Diagnosis not present

## 2017-01-30 DIAGNOSIS — F419 Anxiety disorder, unspecified: Secondary | ICD-10-CM | POA: Diagnosis not present

## 2017-01-30 DIAGNOSIS — S50812A Abrasion of left forearm, initial encounter: Secondary | ICD-10-CM | POA: Diagnosis not present

## 2017-01-30 DIAGNOSIS — Z91410 Personal history of adult physical and sexual abuse: Secondary | ICD-10-CM | POA: Diagnosis not present

## 2017-01-30 LAB — RAPID URINE DRUG SCREEN, HOSP PERFORMED
Amphetamines: NOT DETECTED
Barbiturates: POSITIVE — AB
Benzodiazepines: POSITIVE — AB
Cocaine: NOT DETECTED
Opiates: NOT DETECTED
Tetrahydrocannabinol: NOT DETECTED

## 2017-01-30 LAB — CBC WITH DIFFERENTIAL/PLATELET
Basophils Absolute: 0.1 10*3/uL (ref 0.0–0.1)
Basophils Relative: 1 %
Eosinophils Absolute: 0.2 10*3/uL (ref 0.0–0.7)
Eosinophils Relative: 2 %
HCT: 42.1 % (ref 36.0–46.0)
Hemoglobin: 14.4 g/dL (ref 12.0–15.0)
Lymphocytes Relative: 35 %
Lymphs Abs: 3.3 10*3/uL (ref 0.7–4.0)
MCH: 33.6 pg (ref 26.0–34.0)
MCHC: 34.2 g/dL (ref 30.0–36.0)
MCV: 98.1 fL (ref 78.0–100.0)
Monocytes Absolute: 0.7 10*3/uL (ref 0.1–1.0)
Monocytes Relative: 7 %
Neutro Abs: 5.1 10*3/uL (ref 1.7–7.7)
Neutrophils Relative %: 55 %
Platelets: 226 10*3/uL (ref 150–400)
RBC: 4.29 MIL/uL (ref 3.87–5.11)
RDW: 14.1 % (ref 11.5–15.5)
WBC: 9.2 10*3/uL (ref 4.0–10.5)

## 2017-01-30 LAB — URINALYSIS, ROUTINE W REFLEX MICROSCOPIC
Bacteria, UA: NONE SEEN
Bilirubin Urine: NEGATIVE
Glucose, UA: NEGATIVE mg/dL
Ketones, ur: NEGATIVE mg/dL
Nitrite: NEGATIVE
Protein, ur: NEGATIVE mg/dL
Specific Gravity, Urine: 1.004 — ABNORMAL LOW (ref 1.005–1.030)
pH: 7 (ref 5.0–8.0)

## 2017-01-30 LAB — BASIC METABOLIC PANEL
Anion gap: 8 (ref 5–15)
BUN: 9 mg/dL (ref 6–20)
CO2: 25 mmol/L (ref 22–32)
Calcium: 9.2 mg/dL (ref 8.9–10.3)
Chloride: 106 mmol/L (ref 101–111)
Creatinine, Ser: 0.85 mg/dL (ref 0.44–1.00)
GFR calc Af Amer: >60 mL/min (ref >60)
GFR calc non Af Amer: >60 mL/min (ref >60)
Glucose, Bld: 89 mg/dL (ref 65–99)
Potassium: 3.7 mmol/L (ref 3.5–5.1)
Sodium: 139 mmol/L (ref 135–145)

## 2017-01-30 LAB — ACETAMINOPHEN LEVEL: Acetaminophen (Tylenol), Serum: <10 ug/mL — ABNORMAL LOW (ref 10–30)

## 2017-01-30 LAB — SALICYLATE LEVEL: Salicylate Lvl: <7 mg/dL (ref 2.8–30.0)

## 2017-01-30 MED ORDER — LIDOCAINE HCL (PF) 1 % IJ SOLN
5.0000 mL | Freq: Once | INTRAMUSCULAR | Status: AC
  Administered 2017-01-30: 5 mL via INTRADERMAL
  Filled 2017-01-30: qty 5

## 2017-01-30 MED ORDER — LEVOTHYROXINE SODIUM 125 MCG PO TABS
125.0000 ug | ORAL_TABLET | Freq: Every day | ORAL | Status: DC
  Administered 2017-01-31: 125 ug via ORAL
  Filled 2017-01-30: qty 1

## 2017-01-30 MED ORDER — KETOROLAC TROMETHAMINE 60 MG/2ML IM SOLN
60.0000 mg | Freq: Once | INTRAMUSCULAR | Status: AC
  Administered 2017-01-30: 60 mg via INTRAMUSCULAR
  Filled 2017-01-30: qty 2

## 2017-01-30 MED ORDER — LORAZEPAM 2 MG/ML IJ SOLN
1.0000 mg | Freq: Once | INTRAMUSCULAR | Status: AC
  Administered 2017-01-30: 1 mg via INTRAMUSCULAR

## 2017-01-30 MED ORDER — KETOROLAC TROMETHAMINE 60 MG/2ML IM SOLN: 30.0000 mg | Freq: Once | INTRAMUSCULAR | Status: DC

## 2017-01-30 MED ORDER — OLANZAPINE 10 MG PO TABS: 20.0000 mg | ORAL_TABLET | Freq: Every day | ORAL | Status: DC

## 2017-01-30 MED ORDER — CHLORPROMAZINE HCL 25 MG PO TABS
25.0000 mg | ORAL_TABLET | Freq: Once | ORAL | Status: AC
  Administered 2017-01-30: 25 mg via ORAL
  Filled 2017-01-30: qty 1

## 2017-01-30 MED ORDER — DIPHENHYDRAMINE HCL 25 MG PO CAPS
25.0000 mg | ORAL_CAPSULE | Freq: Once | ORAL | Status: AC
  Administered 2017-01-30: 25 mg via ORAL
  Filled 2017-01-30: qty 1

## 2017-01-30 MED ORDER — TETANUS-DIPHTH-ACELL PERTUSSIS 5-2.5-18.5 LF-MCG/0.5 IM SUSP
0.5000 mL | Freq: Once | INTRAMUSCULAR | Status: AC
  Administered 2017-01-30: 0.5 mL via INTRAMUSCULAR
  Filled 2017-01-30: qty 0.5

## 2017-01-30 MED ORDER — LORAZEPAM 2 MG/ML IJ SOLN
1.0000 mg | Freq: Once | INTRAMUSCULAR | Status: DC
Start: 2017-01-30 — End: 2017-01-30

## 2017-01-30 MED ORDER — BACITRACIN ZINC 500 UNIT/GM EX OINT
TOPICAL_OINTMENT | Freq: Once | CUTANEOUS | Status: AC
  Administered 2017-01-30: 2 via TOPICAL
  Filled 2017-01-30: qty 1.8

## 2017-01-30 MED ORDER — PROMETHAZINE HCL 25 MG/ML IJ SOLN
25.0000 mg | Freq: Once | INTRAMUSCULAR | Status: AC
  Administered 2017-01-30: 25 mg via INTRAMUSCULAR
  Filled 2017-01-30: qty 1

## 2017-01-30 MED ORDER — LORAZEPAM 1 MG PO TABS
1.0000 mg | ORAL_TABLET | Freq: Once | ORAL | Status: AC
  Administered 2017-01-30: 1 mg via ORAL
  Filled 2017-01-30: qty 1

## 2017-01-30 MED ORDER — VENLAFAXINE HCL ER 75 MG PO CP24
75.0000 mg | ORAL_CAPSULE | Freq: Every day | ORAL | Status: DC
  Administered 2017-01-31: 75 mg via ORAL
  Filled 2017-01-30: qty 1

## 2017-01-30 MED ORDER — ATORVASTATIN CALCIUM 10 MG PO TABS: 10.0000 mg | ORAL_TABLET | Freq: Every day | ORAL | Status: DC

## 2017-01-30 MED ORDER — LORAZEPAM 2 MG/ML IJ SOLN
1.0000 mg | Freq: Once | INTRAMUSCULAR | Status: DC
  Filled 2017-01-30: qty 1

## 2017-01-30 NOTE — ED Notes (Signed)
Pt. Transferred to [facility] from ED to room 36 after screening for contraband. Report to include Situation, Background, Assessment and Recommendations from Sarah RN. Pt. Oriented to unit including Q15 minute rounds as well as the security cameras for their protection. Patient is alert and oriented, warm and dry in no acute distress. Patient states she has SI to cut herself as well as HI towards others and AH of voices telling her to hurt herself and [facility] Pt. Encouraged to let me know if needs arise.

## 2017-01-30 NOTE — ED Notes (Signed)
TTS in to see pt  

## 2017-01-30 NOTE — ED Notes (Signed)
Hourly rounding reveals patient sleeping in room. No complaints, stable, in no acute distress. Q15 minute rounds and monitoring via Security Cameras to continue. 

## 2017-01-30 NOTE — ED Notes (Signed)
Hourly rounding reveals patient in room. Stable, in no acute distress. Q15 minute rounds and monitoring via Security Cameras to continue. 
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
## 2017-01-30 NOTE — ED Notes (Signed)
Bed: [id number] Expected date:  Expected time:  Means of arrival:  Comments: SI, superficial lacs to arm

## 2017-01-30 NOTE — [facility] Assessment (Signed)
[facility] Assessment Progress Note   Per Nira ConnJason Berry, NP pt meets criteria for inpt treatment. [facility] at capacity for 500 hall beds. EDP Rosana HoesLayden, Lindsey A, PA-C and pt's nurse Jillyn HiddenGary, RN advised of disposition. Pt states [facility] and would prefer not to go to their inpt facility. TTS to seek placement.   Princess BruinsAquicha Keslyn Roth, MSW, LCSW Therapeutic Triage Specialist  [phone]

## 2017-01-30 NOTE — ED Notes (Signed)
Pt. C/o anxiety and continuing "psychotic" symptoms. Pt. Encouraged to allow previously administered medications to take effect. Pt. agtrees to try and give it some time.

## 2017-01-30 NOTE — ED Triage Notes (Addendum)
Per EMS, pt intentionally cut her left forearm with fingernail clippers in an attempt to harm herself. Pt has lacerations to left forearm. Pt states she is having hallucinations and has SI. Pt also complains of migraine, which she says is typical for her.  BP 114/68 HR 96 O2 94 on RA RR 22

## 2017-01-31 DIAGNOSIS — F17210 Nicotine dependence, cigarettes, uncomplicated: Secondary | ICD-10-CM | POA: Diagnosis not present

## 2017-01-31 DIAGNOSIS — Z91410 Personal history of adult physical and sexual abuse: Secondary | ICD-10-CM

## 2017-01-31 DIAGNOSIS — S51812A Laceration without foreign body of left forearm, initial encounter: Secondary | ICD-10-CM | POA: Diagnosis not present

## 2017-01-31 DIAGNOSIS — F329 Major depressive disorder, single episode, unspecified: Secondary | ICD-10-CM

## 2017-01-31 DIAGNOSIS — F419 Anxiety disorder, unspecified: Secondary | ICD-10-CM

## 2017-01-31 DIAGNOSIS — Z79899 Other long term (current) drug therapy: Secondary | ICD-10-CM | POA: Diagnosis not present

## 2017-01-31 MED ORDER — HYDROXYZINE HCL 25 MG PO TABS
25.0000 mg | ORAL_TABLET | Freq: Three times a day (TID) | ORAL | Status: DC
  Administered 2017-01-31: 25 mg via ORAL
  Filled 2017-01-31: qty 1

## 2017-01-31 NOTE — ED Notes (Signed)
Hourly rounding reveals patient sleeping in room. No complaints, stable, in no acute distress. Q15 minute rounds and monitoring via Security Cameras to continue. 
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
## 2017-01-31 NOTE — BH Assessment (Signed)
[facility] Assessment Progress Note  Per Nelly RoutArchana Kumar, MD, this pt does not require psychiatric hospitalization at this time.  [facility] continue treatment with [facility]le[facility]waruparani [facility].  This has been included in pt's discharge instructions.  Pt's nurse, Kendal Hymendie, has been notified.  Doylene Canninghomas Sherrian Nunnelley, MA Triage Specialist [phone]

## 2017-01-31 NOTE — Discharge Instructions (Signed)
For your behavioral health needs, you are advised to continue treatment with Elvina SidleSwaruparani Gullapalli, MD:       Elvina SidleSwaruparani Gullapalli, MD      [facility]      [address]      [place], [place] [id number]      [phone]

## 2017-01-31 NOTE — ED Notes (Signed)
Pt discharged safely with mother.  This Clinical research associatewriter spoke with mother who feels safe taking patient home.  Pt was calm and cooperative.  All belongings were returned.

## 2017-10-28 ENCOUNTER — Emergency Department (HOSPITAL_COMMUNITY)
Admission: EM | Admit: 2017-10-28 | Discharge: 2017-10-28 | Disposition: A | Discharge status: Home / Self Care | Payer: Medicare Other | Attending: Emergency Medicine | Admitting: Emergency Medicine

## 2017-10-28 ENCOUNTER — Other Ambulatory Visit: Payer: Self-pay

## 2017-10-28 ENCOUNTER — Emergency Department (HOSPITAL_COMMUNITY): Payer: Medicare Other

## 2017-10-28 ENCOUNTER — Encounter (HOSPITAL_COMMUNITY): Payer: Self-pay

## 2017-10-28 DIAGNOSIS — Z79899 Other long term (current) drug therapy: Secondary | ICD-10-CM | POA: Insufficient documentation

## 2017-10-28 DIAGNOSIS — F17210 Nicotine dependence, cigarettes, uncomplicated: Secondary | ICD-10-CM | POA: Diagnosis not present

## 2017-10-28 DIAGNOSIS — R0789 Other chest pain: Secondary | ICD-10-CM | POA: Insufficient documentation

## 2017-10-28 DIAGNOSIS — E039 Hypothyroidism, unspecified: Secondary | ICD-10-CM | POA: Diagnosis not present

## 2017-10-28 LAB — CBC
HCT: 36.8 % (ref 36.0–46.0)
Hemoglobin: 12.4 g/dL (ref 12.0–15.0)
MCH: 33.6 pg (ref 26.0–34.0)
MCHC: 33.7 g/dL (ref 30.0–36.0)
MCV: 99.7 fL (ref 80.0–100.0)
NRBC: 0 % (ref 0.0–0.2)
PLATELETS: 202 10*3/uL (ref 150–400)
RBC: 3.69 MIL/uL — AB (ref 3.87–5.11)
RDW: 13.1 % (ref 11.5–15.5)
WBC: 10.6 10*3/uL — AB (ref 4.0–10.5)

## 2017-10-28 LAB — BASIC METABOLIC PANEL
ANION GAP: 6 (ref 5–15)
BUN: 7 mg/dL (ref 6–20)
CALCIUM: 8.1 mg/dL — AB (ref 8.9–10.3)
CO2: 21 mmol/L — ABNORMAL LOW (ref 22–32)
CREATININE: 0.83 mg/dL (ref 0.44–1.00)
Chloride: 109 mmol/L (ref 98–111)
Glucose, Bld: 80 mg/dL (ref 70–99)
Potassium: 4.5 mmol/L (ref 3.5–5.1)
SODIUM: 136 mmol/L (ref 135–145)

## 2017-10-28 LAB — I-STAT TROPONIN, ED
TROPONIN I, POC: 0.01 ng/mL (ref 0.00–0.08)
Troponin i, poc: 0.01 ng/mL (ref 0.00–0.08)

## 2017-10-28 MED ORDER — LIDOCAINE 5 % EX PTCH: 1.0000 | MEDICATED_PATCH | CUTANEOUS | 0 refills | Status: AC

## 2017-10-28 MED ORDER — PROMETHAZINE HCL 25 MG PO TABS
25.0000 mg | ORAL_TABLET | Freq: Once | ORAL | Status: AC
  Administered 2017-10-28: 25 mg via ORAL
  Filled 2017-10-28: qty 1

## 2017-10-28 MED ORDER — LIDOCAINE 5 % EX PTCH
1.0000 | MEDICATED_PATCH | CUTANEOUS | Status: DC
  Administered 2017-10-28: 1 via TRANSDERMAL
  Filled 2017-10-28: qty 1

## 2017-10-28 MED ORDER — SODIUM CHLORIDE 0.9 % IV BOLUS
1000.0000 mL | Freq: Once | INTRAVENOUS | Status: AC
  Administered 2017-10-28: 1000 mL via INTRAVENOUS

## 2017-10-28 NOTE — Discharge Instructions (Addendum)
Apply Lidoderm patches to sore area on your chest as prescribed.  Follow-up with your primary care provider.  Return to ER for worsening or concerning symptoms.

## 2017-10-28 NOTE — ED Notes (Signed)
ED Provider at bedside. 

## 2017-10-28 NOTE — ED Provider Notes (Signed)
[facility] EMERGENCY DEPARTMENT Provider Note   CSN: [id number] Arrival date & time: 10/28/17  1708     History   Chief Complaint Chief Complaint  Patient presents with  . Chest Pain  . Nausea    HPI [name] is a 52 y.o. female.  52yo female brought in by EMS for pain and nausea. States 2.5weeks of low blood pressure at home, using digital are BP cuff on both arms, 73/56 today. When BP is low has pain in the lower neck, mid back, left side chest (pressure), pain lasts until she is given medicine at the hospital to make her pain go away (has been to [facility] ER for this recently). Also improves with fluids. States BP 120 to 95 systolic when she is pain free. BP currently 109/73 reports pain is 10/10 and making her sick to her stomach. Does not take any medication for BP. Taking Fioricet for headaches, no other pain medications.      Past Medical History:  Diagnosis Date  . ADHD (attention deficit hyperactivity disorder)   . Anxiety   . Bipolar 1 disorder (HCC)   . Depression   . High cholesterol   . Hypothyroid   . Migraine   . Personality disorder (HCC)   . PTSD (post-traumatic stress disorder)   . Schizoaffective disorder [facility])     Patient Active Problem List   Diagnosis Date Noted  . Headache, migraine   . PTSD (post-traumatic stress disorder) 10/19/2013  . MDD (major depressive disorder) 10/18/2013  . Major depression 06/15/2013    Past Surgical History:  Procedure Laterality Date  . CHOLECYSTECTOMY    . MANDIBLE FRACTURE SURGERY       OB History   None      Home Medications    Prior to Admission medications   Medication Sig Start Date End Date Taking? Authorizing Provider  amphetamine-dextroamphetamine (ADDERALL) 10 MG tablet Take 1 tablet (10 mg total) by mouth 2 (two) times daily. For ADHD Patient not taking: Reported on 04/21/2015 10/23/13   Armandina Stammer I, NP  atorvastatin (LIPITOR) 10 MG tablet Take 10 mg by mouth  daily.    [provider]  busPIRone (BUSPAR) 10 MG tablet Take 1 tablet (10 mg total) by mouth 2 (two) times daily. For anxiety Patient not taking: Reported on 03/12/2014 10/23/13   Armandina Stammer I, NP  butalbital-acetaminophen-caffeine (FIORICET) [phone] MG per tablet Take 1 tablet by mouth every 6 (six) hours as needed for headache. Patient not taking: Reported on 07/04/2016 02/19/14   Pisciotta, Mardella Layman  Butalbital-APAP-Caffeine 50-300-40 MG CAPS Take 1 capsule by mouth 2 (two) times daily. 06/08/16   [provider]  chlorproMAZINE (THORAZINE) 50 MG tablet Take 50 mg by mouth 3 (three) times daily as needed for hiccoughs, nausea or vomiting.    [provider]  cyclobenzaprine (FLEXERIL) 10 MG tablet Take 1 tablet (10 mg total) by mouth 2 (two) times daily as needed for muscle spasms. Patient not taking: Reported on 02/14/2015 01/13/15   Danelle Berry, PA-C  diazepam (VALIUM) 10 MG tablet Take 1 tablet (10 mg total) by mouth 3 (three) times daily. For anxiety Patient not taking: Reported on 04/09/2014 10/23/13   Armandina Stammer I, NP  HYDROcodone-acetaminophen (NORCO/VICODIN) 5-325 MG tablet Take 2 tablets by mouth every 4 (four) hours as needed. Patient not taking: Reported on 02/14/2015 01/13/15   Danelle Berry, PA-C  levothyroxine (SYNTHROID, LEVOTHROID) 125 MCG tablet Take 1 tablet (125 mcg total) by  mouth daily before breakfast. For low thyroid function 10/23/13   Nwoko, Nicole Kindred I, NP  lidocaine (LIDODERM) 5 % Place 1 patch onto the skin daily. Remove & Discard patch within 12 hours or as directed by MD 10/28/17   Jeannie Fend, PA-C  lithium carbonate 300 MG capsule Take 1 capsule (300 mg total) by mouth 2 (two) times daily with a meal. For mood stabilization Patient not taking: Reported on 02/14/2015 10/23/13   Armandina Stammer I, NP  LORazepam (ATIVAN) 1 MG tablet Take 1 mg by mouth 2 (two) times daily.    [provider]  methocarbamol (ROBAXIN) 500 MG tablet Take  2 tablets (1,000 mg total) by mouth 4 (four) times daily as needed (Pain). Patient not taking: Reported on 01/30/2017 02/19/14   Pisciotta, Joni Reining, PA-C  morphine (MS CONTIN) 15 MG 12 hr tablet Take 1 tablet (15 mg total) by mouth every 12 (twelve) hours. For severe pain Patient not taking: Reported on 12/25/2013 10/23/13   Armandina Stammer I, NP  OLANZapine (ZYPREXA) 20 MG tablet Take 20 mg by mouth at bedtime.    [provider]  oxyCODONE (OXY IR/ROXICODONE) 5 MG immediate release tablet Take 1 tablet (5 mg total) by mouth every 6 (six) hours as needed for severe pain or breakthrough pain. Patient not taking: Reported on 03/08/2014 10/23/13   Armandina Stammer I, NP  prazosin (MINIPRESS) 2 MG capsule Take 2-6 mg by mouth See admin instructions. Take 1 capsule (2 mg) by mouth twice daily, take 3 capsules (6 mg) at bedtime 12/17/14   [provider]  promethazine (PHENERGAN) 25 MG suppository Place 25 mg rectally every 6 (six) hours as needed for nausea or vomiting.    [provider]  promethazine (PHENERGAN) 25 MG tablet Take 1 tablet (25 mg total) by mouth every 6 (six) hours as needed for nausea or vomiting. Patient not taking: Reported on 07/04/2016 03/14/16   Rolland Porter, MD  tiZANidine (ZANAFLEX) 4 MG tablet Take 4 mg by mouth 3 (three) times daily.    [provider]  traZODone (DESYREL) 100 MG tablet Take 1 tablet (100 mg total) by mouth at bedtime as needed for sleep. For sleep Patient taking differently: Take 200 mg by mouth at bedtime. For sleep 10/23/13   Armandina Stammer I, NP  venlafaxine XR (EFFEXOR-XR) 150 MG 24 hr capsule Take 1 capsule (150 mg total) by mouth daily with breakfast. For depression Patient not taking: Reported on 07/04/2016 10/23/13   Armandina Stammer I, NP  venlafaxine XR (EFFEXOR-XR) 75 MG 24 hr capsule Take 75 mg by mouth daily with breakfast.    [provider]    Family History No family history on file.  Social History Social History    Tobacco Use  . Smoking status: Current Every Day Smoker    Packs/day: 2.00    Types: Cigarettes  . Smokeless tobacco: Never Used  Substance Use Topics  . Alcohol use: No  . Drug use: No    Comment: hx of 19 years ago     Allergies   Haloperidol lactate; Compazine; Decadron [dexamethasone]; Nsaids; Geodon [ziprasidone hydrochloride]; Imitrex [sumatriptan base]; Metoclopramide hcl; Other; Penicillins; Wellbutrin [bupropion hcl]; and Zofran   Review of Systems Review of Systems  Constitutional: Negative for chills, diaphoresis, fatigue and fever.  Respiratory: Negative for chest tightness and shortness of breath.   Cardiovascular: Positive for chest pain. Negative for palpitations and leg swelling.  Gastrointestinal: Positive for nausea. Negative for abdominal pain, constipation, diarrhea  and vomiting.  Genitourinary: Negative for difficulty urinating.  Musculoskeletal: Positive for back pain and neck pain.  Skin: Negative for rash and wound.  Allergic/Immunologic: Negative for immunocompromised state.  Neurological: Negative for dizziness, weakness and headaches.  Psychiatric/Behavioral: Negative for confusion.  All other systems reviewed and are negative.    Physical Exam Updated Vital Signs BP 130/84   Pulse 68   Temp 98.1 F (36.7 C) (Oral)   Resp 12   Ht 5\' 3"  (1.6 m)   Wt 61.2 kg   LMP 12/04/2010   SpO2 97%   BMI 23.91 kg/m   Physical Exam  Constitutional: She is oriented to person, place, and time. She appears well-developed and well-nourished. No distress.  HENT:  Head: Normocephalic and atraumatic.  Neck: Neck supple. No hepatojugular reflux present.  Cardiovascular: Regular rhythm, intact distal pulses and normal pulses. Bradycardia present.  No murmur heard. Pulmonary/Chest: Effort normal and breath sounds normal. She has no decreased breath sounds. She exhibits bony tenderness.    Abdominal: Soft. There is no tenderness.  Musculoskeletal:        Cervical back: She exhibits no tenderness and no bony tenderness.       Thoracic back: She exhibits no tenderness and no bony tenderness.       Lumbar back: She exhibits no tenderness and no bony tenderness.       Right lower leg: Normal. She exhibits no edema.       Left lower leg: Normal. She exhibits no edema.  Lymphadenopathy:    She has no cervical adenopathy.  Neurological: She is alert and oriented to person, place, and time.  Skin: Skin is warm and dry. She is not diaphoretic.  Psychiatric: She has a normal mood and affect. Her behavior is normal.  Nursing note and vitals reviewed.    ED Treatments / Results  Labs (all labs ordered are listed, but only abnormal results are displayed) Labs Reviewed  BASIC METABOLIC PANEL - Abnormal; Notable for the following components:      Result Value   CO2 21 (*)    Calcium 8.1 (*)    All other components within normal limits  CBC - Abnormal; Notable for the following components:   WBC 10.6 (*)    RBC 3.69 (*)    All other components within normal limits  I-STAT TROPONIN, ED  I-STAT TROPONIN, ED    EKG EKG Interpretation  Date/Time:  Friday October 28 2017 17:16:46 EDT Ventricular Rate:  48 PR Interval:    QRS Duration: 84 QT Interval:  448 QTC Calculation: 401 R Axis:   58 Text Interpretation:  Sinus bradycardia Confirmed by Virgina Norfolk [phone]) on 10/28/2017 5:27:05 PM   Radiology Dg Chest 2 View  Result Date: 10/28/2017 CLINICAL DATA:  Chest pain. EXAM: CHEST - 2 VIEW COMPARISON:  Chest x-ray dated October 27, 2017. FINDINGS: The heart size and mediastinal contours are within normal limits. Normal pulmonary vascularity. New coarsened interstitial markings and peribronchial thickening in in both lower lobes. No focal consolidation, pleural effusion, or pneumothorax. No acute osseous abnormality. IMPRESSION: New lower lobe bronchitic changes, possibly smoking-related. Electronically Signed   By: Obie Dredge M.D.   On:  10/28/2017 18:34    Procedures Procedures (including critical care time)  Medications Ordered in ED Medications  lidocaine (LIDODERM) 5 % 1 patch (has no administration in time range)  sodium chloride 0.9 % bolus 1,000 mL (0 mLs Intravenous Stopped 10/28/17 1900)  promethazine (PHENERGAN) tablet 25 mg (25 mg  Oral Given 10/28/17 1859)     Initial Impression / Assessment and Plan / ED Course  I have reviewed the triage vital signs and the nursing notes.  Pertinent labs & imaging results that were available during my care of the patient were reviewed by me and considered in my medical decision making (see chart for details).  Clinical Course as of Oct 29 2222  Fri Oct 28, 2017  6330 52 year old female brought in by EMS with complaint of pain in her chest, neck, back which occur when she has a drop in her blood pressure.  Patient's blood pressure has returned to normal, she continues to have complaints of pain and nausea.  Troponin x2 is negative, CBC and BMP without significant findings, chest x-ray with bronchitic changes.  EKG without acute findings.  Patient has been seen by [facility] ER for same, she is unhappy with her care there and presented to this emergency room today.  Exam patient has chest wall tenderness, otherwise exam is unremarkable.  She felt better after Phenergan and IV fluids, continues to complain of pain.  Patient was seen by Dr. Lockie Mola, ER attending, recommends Lidoderm patch, discharged with prescription for same and follow-up with PCP.   [LM]    Clinical Course User Index [LM] Jeannie Fend, PA-C   Final Clinical Impressions(s) / ED Diagnoses   Final diagnoses:  Chest wall pain    ED Discharge Orders         Ordered    lidocaine (LIDODERM) 5 %  Every 24 hours     10/28/17 2220           Jeannie Fend, PA-C 10/28/17 2224    Virgina Norfolk, DO 10/29/17 [phone]

## 2017-10-28 NOTE — ED Triage Notes (Signed)
Pt from home with [place] ems for multiple complaints: chest pain, nausea, neck and back pain. Pt was seen multiple times last week at [facility] for the same but states they did nothing for her and wanted to be seen at a different hospital. Pain 10/10. NAD noted at this time. Pt a.o   BP 100 palpated HR61 20 RFA, saline given with esm

## 2017-10-28 NOTE — ED Notes (Signed)
Patient verbalizes understanding of medications and discharge instructions. No further questions at this time. VSS and patient ambulatory at discharge.
